# Patient Record
Sex: Female | Born: 1935 | Race: Black or African American | Hispanic: No | Smoking: Former smoker
Health system: Southern US, Community
[De-identification: ages and names within clinical notes are randomized; demographics above are authoritative.]

## PROBLEM LIST (undated history)

## (undated) DIAGNOSIS — F419 Anxiety disorder, unspecified: Secondary | ICD-10-CM

## (undated) DIAGNOSIS — F0390 Unspecified dementia without behavioral disturbance: Secondary | ICD-10-CM

## (undated) DIAGNOSIS — E538 Deficiency of other specified B group vitamins: Secondary | ICD-10-CM

## (undated) DIAGNOSIS — F319 Bipolar disorder, unspecified: Secondary | ICD-10-CM

## (undated) DIAGNOSIS — I4510 Unspecified right bundle-branch block: Secondary | ICD-10-CM

## (undated) DIAGNOSIS — I10 Essential (primary) hypertension: Secondary | ICD-10-CM

## (undated) HISTORY — DX: Unspecified right bundle-branch block: I45.10

## (undated) HISTORY — DX: Bipolar disorder, unspecified: F31.9

---

## 2004-03-15 ENCOUNTER — Other Ambulatory Visit: Payer: Self-pay

## 2004-04-16 ENCOUNTER — Ambulatory Visit: Payer: Self-pay | Admitting: Internal Medicine

## 2004-06-28 ENCOUNTER — Ambulatory Visit: Payer: Self-pay | Admitting: Internal Medicine

## 2004-11-29 ENCOUNTER — Ambulatory Visit: Payer: Self-pay | Admitting: Pain Medicine

## 2005-05-16 ENCOUNTER — Ambulatory Visit: Payer: Self-pay | Admitting: Internal Medicine

## 2005-09-10 ENCOUNTER — Other Ambulatory Visit: Payer: Self-pay

## 2005-09-11 ENCOUNTER — Other Ambulatory Visit: Payer: Self-pay

## 2005-09-11 ENCOUNTER — Observation Stay: Payer: Self-pay | Admitting: Internal Medicine

## 2005-11-15 ENCOUNTER — Ambulatory Visit: Payer: Self-pay | Admitting: Internal Medicine

## 2005-12-20 ENCOUNTER — Emergency Department: Payer: Self-pay | Admitting: Emergency Medicine

## 2006-02-17 ENCOUNTER — Ambulatory Visit: Payer: Self-pay | Admitting: Internal Medicine

## 2006-05-27 ENCOUNTER — Ambulatory Visit: Payer: Self-pay | Admitting: Ophthalmology

## 2006-06-20 ENCOUNTER — Ambulatory Visit: Payer: Self-pay | Admitting: Internal Medicine

## 2006-12-12 ENCOUNTER — Other Ambulatory Visit: Payer: Self-pay

## 2006-12-12 ENCOUNTER — Ambulatory Visit: Payer: Self-pay | Admitting: Ophthalmology

## 2006-12-17 ENCOUNTER — Ambulatory Visit: Payer: Self-pay | Admitting: Ophthalmology

## 2007-01-08 ENCOUNTER — Ambulatory Visit: Payer: Self-pay | Admitting: Ophthalmology

## 2007-05-19 ENCOUNTER — Ambulatory Visit: Payer: Self-pay | Admitting: Neurology

## 2007-06-01 ENCOUNTER — Other Ambulatory Visit: Payer: Self-pay

## 2007-06-01 ENCOUNTER — Emergency Department: Payer: Self-pay | Admitting: Emergency Medicine

## 2007-07-21 ENCOUNTER — Ambulatory Visit: Payer: Self-pay | Admitting: Internal Medicine

## 2007-09-07 ENCOUNTER — Ambulatory Visit: Payer: Self-pay | Admitting: Gastroenterology

## 2008-03-20 ENCOUNTER — Other Ambulatory Visit: Payer: Self-pay

## 2008-03-20 ENCOUNTER — Inpatient Hospital Stay: Payer: Self-pay | Admitting: Internal Medicine

## 2008-03-21 ENCOUNTER — Other Ambulatory Visit: Payer: Self-pay

## 2008-09-07 ENCOUNTER — Ambulatory Visit: Payer: Self-pay | Admitting: Internal Medicine

## 2008-09-19 ENCOUNTER — Ambulatory Visit: Payer: Self-pay | Admitting: Neurology

## 2008-11-11 ENCOUNTER — Emergency Department: Payer: Self-pay | Admitting: Internal Medicine

## 2009-03-03 ENCOUNTER — Emergency Department: Payer: Self-pay | Admitting: Emergency Medicine

## 2009-07-08 ENCOUNTER — Inpatient Hospital Stay: Payer: Self-pay | Admitting: Internal Medicine

## 2009-09-13 ENCOUNTER — Ambulatory Visit: Payer: Self-pay | Admitting: Internal Medicine

## 2010-03-01 ENCOUNTER — Ambulatory Visit: Payer: Self-pay | Admitting: Internal Medicine

## 2010-07-18 ENCOUNTER — Ambulatory Visit: Payer: Self-pay | Admitting: Pain Medicine

## 2010-07-25 ENCOUNTER — Ambulatory Visit: Payer: Self-pay | Admitting: Pain Medicine

## 2010-07-31 ENCOUNTER — Ambulatory Visit: Payer: Self-pay | Admitting: Pain Medicine

## 2010-09-19 ENCOUNTER — Ambulatory Visit: Payer: Self-pay | Admitting: Pain Medicine

## 2010-09-25 ENCOUNTER — Ambulatory Visit: Payer: Self-pay | Admitting: Internal Medicine

## 2010-09-27 ENCOUNTER — Ambulatory Visit: Payer: Self-pay | Admitting: Pain Medicine

## 2011-01-01 ENCOUNTER — Ambulatory Visit: Payer: Self-pay | Admitting: Pain Medicine

## 2011-01-08 ENCOUNTER — Ambulatory Visit: Payer: Self-pay | Admitting: Pain Medicine

## 2011-03-07 ENCOUNTER — Ambulatory Visit: Payer: Self-pay | Admitting: Pain Medicine

## 2011-03-25 ENCOUNTER — Ambulatory Visit: Payer: Self-pay | Admitting: Pain Medicine

## 2011-06-13 ENCOUNTER — Ambulatory Visit: Payer: Self-pay | Admitting: Pain Medicine

## 2011-07-03 ENCOUNTER — Ambulatory Visit: Payer: Self-pay | Admitting: Pain Medicine

## 2011-07-11 ENCOUNTER — Ambulatory Visit: Payer: Self-pay | Admitting: Pain Medicine

## 2011-08-01 ENCOUNTER — Ambulatory Visit: Payer: Self-pay | Admitting: Pain Medicine

## 2011-08-21 ENCOUNTER — Ambulatory Visit: Payer: Self-pay | Admitting: Pain Medicine

## 2011-11-08 ENCOUNTER — Ambulatory Visit: Payer: Self-pay | Admitting: Internal Medicine

## 2012-09-26 ENCOUNTER — Emergency Department: Payer: Self-pay | Admitting: Emergency Medicine

## 2012-09-26 LAB — SEDIMENTATION RATE: Erythrocyte Sed Rate: 36 mm/hr — ABNORMAL HIGH (ref 0–30)

## 2012-12-23 ENCOUNTER — Ambulatory Visit: Payer: Self-pay | Admitting: Internal Medicine

## 2014-07-11 DIAGNOSIS — F419 Anxiety disorder, unspecified: Secondary | ICD-10-CM | POA: Diagnosis not present

## 2014-07-11 DIAGNOSIS — N183 Chronic kidney disease, stage 3 (moderate): Secondary | ICD-10-CM | POA: Diagnosis not present

## 2014-07-11 DIAGNOSIS — W19XXXA Unspecified fall, initial encounter: Secondary | ICD-10-CM | POA: Diagnosis not present

## 2014-07-11 DIAGNOSIS — I10 Essential (primary) hypertension: Secondary | ICD-10-CM | POA: Diagnosis not present

## 2014-10-11 DIAGNOSIS — E538 Deficiency of other specified B group vitamins: Secondary | ICD-10-CM | POA: Insufficient documentation

## 2014-10-11 DIAGNOSIS — I10 Essential (primary) hypertension: Secondary | ICD-10-CM | POA: Diagnosis not present

## 2014-10-11 DIAGNOSIS — N183 Chronic kidney disease, stage 3 (moderate): Secondary | ICD-10-CM | POA: Diagnosis not present

## 2014-10-11 DIAGNOSIS — E782 Mixed hyperlipidemia: Secondary | ICD-10-CM | POA: Diagnosis not present

## 2014-10-11 DIAGNOSIS — R413 Other amnesia: Secondary | ICD-10-CM | POA: Diagnosis not present

## 2015-09-05 DIAGNOSIS — I10 Essential (primary) hypertension: Secondary | ICD-10-CM | POA: Diagnosis not present

## 2015-09-05 DIAGNOSIS — E538 Deficiency of other specified B group vitamins: Secondary | ICD-10-CM | POA: Diagnosis not present

## 2015-09-05 DIAGNOSIS — R5383 Other fatigue: Secondary | ICD-10-CM | POA: Diagnosis not present

## 2015-09-05 DIAGNOSIS — R5381 Other malaise: Secondary | ICD-10-CM | POA: Diagnosis not present

## 2015-09-05 DIAGNOSIS — R413 Other amnesia: Secondary | ICD-10-CM | POA: Diagnosis not present

## 2016-01-05 DIAGNOSIS — R413 Other amnesia: Secondary | ICD-10-CM | POA: Diagnosis not present

## 2016-01-05 DIAGNOSIS — I10 Essential (primary) hypertension: Secondary | ICD-10-CM | POA: Diagnosis not present

## 2016-01-05 DIAGNOSIS — Z0000 Encounter for general adult medical examination without abnormal findings: Secondary | ICD-10-CM | POA: Diagnosis not present

## 2016-01-05 DIAGNOSIS — F419 Anxiety disorder, unspecified: Secondary | ICD-10-CM | POA: Diagnosis not present

## 2016-01-05 DIAGNOSIS — E782 Mixed hyperlipidemia: Secondary | ICD-10-CM | POA: Diagnosis not present

## 2016-01-05 DIAGNOSIS — N183 Chronic kidney disease, stage 3 (moderate): Secondary | ICD-10-CM | POA: Diagnosis not present

## 2016-01-05 DIAGNOSIS — Z8669 Personal history of other diseases of the nervous system and sense organs: Secondary | ICD-10-CM | POA: Diagnosis not present

## 2016-02-06 DIAGNOSIS — R451 Restlessness and agitation: Secondary | ICD-10-CM | POA: Diagnosis not present

## 2016-02-16 DIAGNOSIS — F0281 Dementia in other diseases classified elsewhere with behavioral disturbance: Secondary | ICD-10-CM | POA: Diagnosis not present

## 2016-02-16 DIAGNOSIS — F329 Major depressive disorder, single episode, unspecified: Secondary | ICD-10-CM | POA: Diagnosis not present

## 2016-02-16 DIAGNOSIS — Z9181 History of falling: Secondary | ICD-10-CM | POA: Diagnosis not present

## 2016-02-16 DIAGNOSIS — F419 Anxiety disorder, unspecified: Secondary | ICD-10-CM | POA: Diagnosis not present

## 2016-02-16 DIAGNOSIS — I129 Hypertensive chronic kidney disease with stage 1 through stage 4 chronic kidney disease, or unspecified chronic kidney disease: Secondary | ICD-10-CM | POA: Diagnosis not present

## 2016-02-16 DIAGNOSIS — D519 Vitamin B12 deficiency anemia, unspecified: Secondary | ICD-10-CM | POA: Diagnosis not present

## 2016-02-16 DIAGNOSIS — G309 Alzheimer's disease, unspecified: Secondary | ICD-10-CM | POA: Diagnosis not present

## 2016-02-16 DIAGNOSIS — N183 Chronic kidney disease, stage 3 (moderate): Secondary | ICD-10-CM | POA: Diagnosis not present

## 2016-02-20 DIAGNOSIS — F0281 Dementia in other diseases classified elsewhere with behavioral disturbance: Secondary | ICD-10-CM | POA: Diagnosis not present

## 2016-02-20 DIAGNOSIS — F329 Major depressive disorder, single episode, unspecified: Secondary | ICD-10-CM | POA: Diagnosis not present

## 2016-02-20 DIAGNOSIS — Z9181 History of falling: Secondary | ICD-10-CM | POA: Diagnosis not present

## 2016-02-20 DIAGNOSIS — F419 Anxiety disorder, unspecified: Secondary | ICD-10-CM | POA: Diagnosis not present

## 2016-02-20 DIAGNOSIS — G309 Alzheimer's disease, unspecified: Secondary | ICD-10-CM | POA: Diagnosis not present

## 2016-02-20 DIAGNOSIS — D519 Vitamin B12 deficiency anemia, unspecified: Secondary | ICD-10-CM | POA: Diagnosis not present

## 2016-02-20 DIAGNOSIS — I129 Hypertensive chronic kidney disease with stage 1 through stage 4 chronic kidney disease, or unspecified chronic kidney disease: Secondary | ICD-10-CM | POA: Diagnosis not present

## 2016-02-20 DIAGNOSIS — N183 Chronic kidney disease, stage 3 (moderate): Secondary | ICD-10-CM | POA: Diagnosis not present

## 2016-02-22 DIAGNOSIS — F0281 Dementia in other diseases classified elsewhere with behavioral disturbance: Secondary | ICD-10-CM | POA: Diagnosis not present

## 2016-02-22 DIAGNOSIS — F419 Anxiety disorder, unspecified: Secondary | ICD-10-CM | POA: Diagnosis not present

## 2016-02-22 DIAGNOSIS — Z9181 History of falling: Secondary | ICD-10-CM | POA: Diagnosis not present

## 2016-02-22 DIAGNOSIS — F329 Major depressive disorder, single episode, unspecified: Secondary | ICD-10-CM | POA: Diagnosis not present

## 2016-02-22 DIAGNOSIS — D519 Vitamin B12 deficiency anemia, unspecified: Secondary | ICD-10-CM | POA: Diagnosis not present

## 2016-02-22 DIAGNOSIS — I129 Hypertensive chronic kidney disease with stage 1 through stage 4 chronic kidney disease, or unspecified chronic kidney disease: Secondary | ICD-10-CM | POA: Diagnosis not present

## 2016-02-22 DIAGNOSIS — G309 Alzheimer's disease, unspecified: Secondary | ICD-10-CM | POA: Diagnosis not present

## 2016-02-22 DIAGNOSIS — N183 Chronic kidney disease, stage 3 (moderate): Secondary | ICD-10-CM | POA: Diagnosis not present

## 2016-02-23 DIAGNOSIS — F329 Major depressive disorder, single episode, unspecified: Secondary | ICD-10-CM | POA: Diagnosis not present

## 2016-02-23 DIAGNOSIS — N183 Chronic kidney disease, stage 3 (moderate): Secondary | ICD-10-CM | POA: Diagnosis not present

## 2016-02-23 DIAGNOSIS — I129 Hypertensive chronic kidney disease with stage 1 through stage 4 chronic kidney disease, or unspecified chronic kidney disease: Secondary | ICD-10-CM | POA: Diagnosis not present

## 2016-02-23 DIAGNOSIS — G309 Alzheimer's disease, unspecified: Secondary | ICD-10-CM | POA: Diagnosis not present

## 2016-02-23 DIAGNOSIS — F419 Anxiety disorder, unspecified: Secondary | ICD-10-CM | POA: Diagnosis not present

## 2016-02-23 DIAGNOSIS — D519 Vitamin B12 deficiency anemia, unspecified: Secondary | ICD-10-CM | POA: Diagnosis not present

## 2016-02-23 DIAGNOSIS — F0281 Dementia in other diseases classified elsewhere with behavioral disturbance: Secondary | ICD-10-CM | POA: Diagnosis not present

## 2016-02-29 DIAGNOSIS — N183 Chronic kidney disease, stage 3 (moderate): Secondary | ICD-10-CM | POA: Diagnosis not present

## 2016-02-29 DIAGNOSIS — F419 Anxiety disorder, unspecified: Secondary | ICD-10-CM | POA: Diagnosis not present

## 2016-02-29 DIAGNOSIS — Z9181 History of falling: Secondary | ICD-10-CM | POA: Diagnosis not present

## 2016-02-29 DIAGNOSIS — I129 Hypertensive chronic kidney disease with stage 1 through stage 4 chronic kidney disease, or unspecified chronic kidney disease: Secondary | ICD-10-CM | POA: Diagnosis not present

## 2016-02-29 DIAGNOSIS — F329 Major depressive disorder, single episode, unspecified: Secondary | ICD-10-CM | POA: Diagnosis not present

## 2016-02-29 DIAGNOSIS — G309 Alzheimer's disease, unspecified: Secondary | ICD-10-CM | POA: Diagnosis not present

## 2016-02-29 DIAGNOSIS — F0281 Dementia in other diseases classified elsewhere with behavioral disturbance: Secondary | ICD-10-CM | POA: Diagnosis not present

## 2016-02-29 DIAGNOSIS — D519 Vitamin B12 deficiency anemia, unspecified: Secondary | ICD-10-CM | POA: Diagnosis not present

## 2016-03-05 DIAGNOSIS — N183 Chronic kidney disease, stage 3 (moderate): Secondary | ICD-10-CM | POA: Diagnosis not present

## 2016-03-05 DIAGNOSIS — F0281 Dementia in other diseases classified elsewhere with behavioral disturbance: Secondary | ICD-10-CM | POA: Diagnosis not present

## 2016-03-05 DIAGNOSIS — D519 Vitamin B12 deficiency anemia, unspecified: Secondary | ICD-10-CM | POA: Diagnosis not present

## 2016-03-05 DIAGNOSIS — Z9181 History of falling: Secondary | ICD-10-CM | POA: Diagnosis not present

## 2016-03-05 DIAGNOSIS — G309 Alzheimer's disease, unspecified: Secondary | ICD-10-CM | POA: Diagnosis not present

## 2016-03-05 DIAGNOSIS — F329 Major depressive disorder, single episode, unspecified: Secondary | ICD-10-CM | POA: Diagnosis not present

## 2016-03-05 DIAGNOSIS — I129 Hypertensive chronic kidney disease with stage 1 through stage 4 chronic kidney disease, or unspecified chronic kidney disease: Secondary | ICD-10-CM | POA: Diagnosis not present

## 2016-03-05 DIAGNOSIS — F419 Anxiety disorder, unspecified: Secondary | ICD-10-CM | POA: Diagnosis not present

## 2016-03-11 DIAGNOSIS — N183 Chronic kidney disease, stage 3 (moderate): Secondary | ICD-10-CM | POA: Diagnosis not present

## 2016-03-11 DIAGNOSIS — F419 Anxiety disorder, unspecified: Secondary | ICD-10-CM | POA: Diagnosis not present

## 2016-03-11 DIAGNOSIS — Z9181 History of falling: Secondary | ICD-10-CM | POA: Diagnosis not present

## 2016-03-11 DIAGNOSIS — I129 Hypertensive chronic kidney disease with stage 1 through stage 4 chronic kidney disease, or unspecified chronic kidney disease: Secondary | ICD-10-CM | POA: Diagnosis not present

## 2016-03-11 DIAGNOSIS — F0281 Dementia in other diseases classified elsewhere with behavioral disturbance: Secondary | ICD-10-CM | POA: Diagnosis not present

## 2016-03-11 DIAGNOSIS — D519 Vitamin B12 deficiency anemia, unspecified: Secondary | ICD-10-CM | POA: Diagnosis not present

## 2016-03-11 DIAGNOSIS — G309 Alzheimer's disease, unspecified: Secondary | ICD-10-CM | POA: Diagnosis not present

## 2016-03-11 DIAGNOSIS — F329 Major depressive disorder, single episode, unspecified: Secondary | ICD-10-CM | POA: Diagnosis not present

## 2016-03-13 DIAGNOSIS — F329 Major depressive disorder, single episode, unspecified: Secondary | ICD-10-CM | POA: Diagnosis not present

## 2016-03-13 DIAGNOSIS — D519 Vitamin B12 deficiency anemia, unspecified: Secondary | ICD-10-CM | POA: Diagnosis not present

## 2016-03-13 DIAGNOSIS — G309 Alzheimer's disease, unspecified: Secondary | ICD-10-CM | POA: Diagnosis not present

## 2016-03-13 DIAGNOSIS — F0281 Dementia in other diseases classified elsewhere with behavioral disturbance: Secondary | ICD-10-CM | POA: Diagnosis not present

## 2016-03-13 DIAGNOSIS — F419 Anxiety disorder, unspecified: Secondary | ICD-10-CM | POA: Diagnosis not present

## 2016-03-13 DIAGNOSIS — Z9181 History of falling: Secondary | ICD-10-CM | POA: Diagnosis not present

## 2016-03-13 DIAGNOSIS — I129 Hypertensive chronic kidney disease with stage 1 through stage 4 chronic kidney disease, or unspecified chronic kidney disease: Secondary | ICD-10-CM | POA: Diagnosis not present

## 2016-03-13 DIAGNOSIS — N183 Chronic kidney disease, stage 3 (moderate): Secondary | ICD-10-CM | POA: Diagnosis not present

## 2016-03-15 DIAGNOSIS — F329 Major depressive disorder, single episode, unspecified: Secondary | ICD-10-CM | POA: Diagnosis not present

## 2016-03-15 DIAGNOSIS — F0281 Dementia in other diseases classified elsewhere with behavioral disturbance: Secondary | ICD-10-CM | POA: Diagnosis not present

## 2016-03-15 DIAGNOSIS — Z9181 History of falling: Secondary | ICD-10-CM | POA: Diagnosis not present

## 2016-03-15 DIAGNOSIS — I129 Hypertensive chronic kidney disease with stage 1 through stage 4 chronic kidney disease, or unspecified chronic kidney disease: Secondary | ICD-10-CM | POA: Diagnosis not present

## 2016-03-15 DIAGNOSIS — G309 Alzheimer's disease, unspecified: Secondary | ICD-10-CM | POA: Diagnosis not present

## 2016-03-15 DIAGNOSIS — F419 Anxiety disorder, unspecified: Secondary | ICD-10-CM | POA: Diagnosis not present

## 2016-03-15 DIAGNOSIS — D519 Vitamin B12 deficiency anemia, unspecified: Secondary | ICD-10-CM | POA: Diagnosis not present

## 2016-03-15 DIAGNOSIS — N183 Chronic kidney disease, stage 3 (moderate): Secondary | ICD-10-CM | POA: Diagnosis not present

## 2016-03-18 DIAGNOSIS — G309 Alzheimer's disease, unspecified: Secondary | ICD-10-CM | POA: Diagnosis not present

## 2016-03-18 DIAGNOSIS — F0281 Dementia in other diseases classified elsewhere with behavioral disturbance: Secondary | ICD-10-CM | POA: Diagnosis not present

## 2016-03-18 DIAGNOSIS — F419 Anxiety disorder, unspecified: Secondary | ICD-10-CM | POA: Diagnosis not present

## 2016-03-18 DIAGNOSIS — D519 Vitamin B12 deficiency anemia, unspecified: Secondary | ICD-10-CM | POA: Diagnosis not present

## 2016-03-18 DIAGNOSIS — N183 Chronic kidney disease, stage 3 (moderate): Secondary | ICD-10-CM | POA: Diagnosis not present

## 2016-03-18 DIAGNOSIS — F329 Major depressive disorder, single episode, unspecified: Secondary | ICD-10-CM | POA: Diagnosis not present

## 2016-03-18 DIAGNOSIS — I129 Hypertensive chronic kidney disease with stage 1 through stage 4 chronic kidney disease, or unspecified chronic kidney disease: Secondary | ICD-10-CM | POA: Diagnosis not present

## 2016-03-18 DIAGNOSIS — Z9181 History of falling: Secondary | ICD-10-CM | POA: Diagnosis not present

## 2016-03-21 DIAGNOSIS — E782 Mixed hyperlipidemia: Secondary | ICD-10-CM | POA: Diagnosis not present

## 2016-03-21 DIAGNOSIS — F419 Anxiety disorder, unspecified: Secondary | ICD-10-CM | POA: Diagnosis not present

## 2016-03-21 DIAGNOSIS — I10 Essential (primary) hypertension: Secondary | ICD-10-CM | POA: Diagnosis not present

## 2016-03-21 DIAGNOSIS — I129 Hypertensive chronic kidney disease with stage 1 through stage 4 chronic kidney disease, or unspecified chronic kidney disease: Secondary | ICD-10-CM | POA: Diagnosis not present

## 2016-03-21 DIAGNOSIS — R413 Other amnesia: Secondary | ICD-10-CM | POA: Diagnosis not present

## 2016-03-21 DIAGNOSIS — Z9181 History of falling: Secondary | ICD-10-CM | POA: Diagnosis not present

## 2016-03-21 DIAGNOSIS — D508 Other iron deficiency anemias: Secondary | ICD-10-CM | POA: Diagnosis not present

## 2016-03-21 DIAGNOSIS — F329 Major depressive disorder, single episode, unspecified: Secondary | ICD-10-CM | POA: Diagnosis not present

## 2016-03-21 DIAGNOSIS — E538 Deficiency of other specified B group vitamins: Secondary | ICD-10-CM | POA: Diagnosis not present

## 2016-03-21 DIAGNOSIS — M542 Cervicalgia: Secondary | ICD-10-CM | POA: Diagnosis not present

## 2016-03-21 DIAGNOSIS — F0281 Dementia in other diseases classified elsewhere with behavioral disturbance: Secondary | ICD-10-CM | POA: Diagnosis not present

## 2016-03-21 DIAGNOSIS — D519 Vitamin B12 deficiency anemia, unspecified: Secondary | ICD-10-CM | POA: Diagnosis not present

## 2016-03-21 DIAGNOSIS — Z8669 Personal history of other diseases of the nervous system and sense organs: Secondary | ICD-10-CM | POA: Diagnosis not present

## 2016-03-21 DIAGNOSIS — N183 Chronic kidney disease, stage 3 (moderate): Secondary | ICD-10-CM | POA: Diagnosis not present

## 2016-03-21 DIAGNOSIS — G309 Alzheimer's disease, unspecified: Secondary | ICD-10-CM | POA: Diagnosis not present

## 2016-03-22 DIAGNOSIS — M47812 Spondylosis without myelopathy or radiculopathy, cervical region: Secondary | ICD-10-CM | POA: Diagnosis not present

## 2016-03-22 DIAGNOSIS — M542 Cervicalgia: Secondary | ICD-10-CM | POA: Diagnosis not present

## 2016-03-25 DIAGNOSIS — Z9181 History of falling: Secondary | ICD-10-CM | POA: Diagnosis not present

## 2016-03-25 DIAGNOSIS — G309 Alzheimer's disease, unspecified: Secondary | ICD-10-CM | POA: Diagnosis not present

## 2016-03-25 DIAGNOSIS — D519 Vitamin B12 deficiency anemia, unspecified: Secondary | ICD-10-CM | POA: Diagnosis not present

## 2016-03-25 DIAGNOSIS — I129 Hypertensive chronic kidney disease with stage 1 through stage 4 chronic kidney disease, or unspecified chronic kidney disease: Secondary | ICD-10-CM | POA: Diagnosis not present

## 2016-03-25 DIAGNOSIS — F419 Anxiety disorder, unspecified: Secondary | ICD-10-CM | POA: Diagnosis not present

## 2016-03-25 DIAGNOSIS — F329 Major depressive disorder, single episode, unspecified: Secondary | ICD-10-CM | POA: Diagnosis not present

## 2016-03-25 DIAGNOSIS — F0281 Dementia in other diseases classified elsewhere with behavioral disturbance: Secondary | ICD-10-CM | POA: Diagnosis not present

## 2016-03-25 DIAGNOSIS — N183 Chronic kidney disease, stage 3 (moderate): Secondary | ICD-10-CM | POA: Diagnosis not present

## 2016-03-27 DIAGNOSIS — N183 Chronic kidney disease, stage 3 (moderate): Secondary | ICD-10-CM | POA: Diagnosis not present

## 2016-03-27 DIAGNOSIS — I129 Hypertensive chronic kidney disease with stage 1 through stage 4 chronic kidney disease, or unspecified chronic kidney disease: Secondary | ICD-10-CM | POA: Diagnosis not present

## 2016-03-27 DIAGNOSIS — D519 Vitamin B12 deficiency anemia, unspecified: Secondary | ICD-10-CM | POA: Diagnosis not present

## 2016-03-27 DIAGNOSIS — F0281 Dementia in other diseases classified elsewhere with behavioral disturbance: Secondary | ICD-10-CM | POA: Diagnosis not present

## 2016-03-27 DIAGNOSIS — F419 Anxiety disorder, unspecified: Secondary | ICD-10-CM | POA: Diagnosis not present

## 2016-03-27 DIAGNOSIS — G309 Alzheimer's disease, unspecified: Secondary | ICD-10-CM | POA: Diagnosis not present

## 2016-03-27 DIAGNOSIS — Z9181 History of falling: Secondary | ICD-10-CM | POA: Diagnosis not present

## 2016-03-27 DIAGNOSIS — F329 Major depressive disorder, single episode, unspecified: Secondary | ICD-10-CM | POA: Diagnosis not present

## 2016-03-28 DIAGNOSIS — N183 Chronic kidney disease, stage 3 (moderate): Secondary | ICD-10-CM | POA: Diagnosis not present

## 2016-03-28 DIAGNOSIS — F419 Anxiety disorder, unspecified: Secondary | ICD-10-CM | POA: Diagnosis not present

## 2016-03-28 DIAGNOSIS — D519 Vitamin B12 deficiency anemia, unspecified: Secondary | ICD-10-CM | POA: Diagnosis not present

## 2016-03-28 DIAGNOSIS — F0281 Dementia in other diseases classified elsewhere with behavioral disturbance: Secondary | ICD-10-CM | POA: Diagnosis not present

## 2016-03-28 DIAGNOSIS — I129 Hypertensive chronic kidney disease with stage 1 through stage 4 chronic kidney disease, or unspecified chronic kidney disease: Secondary | ICD-10-CM | POA: Diagnosis not present

## 2016-03-28 DIAGNOSIS — F329 Major depressive disorder, single episode, unspecified: Secondary | ICD-10-CM | POA: Diagnosis not present

## 2016-03-28 DIAGNOSIS — Z9181 History of falling: Secondary | ICD-10-CM | POA: Diagnosis not present

## 2016-03-28 DIAGNOSIS — G309 Alzheimer's disease, unspecified: Secondary | ICD-10-CM | POA: Diagnosis not present

## 2016-04-01 DIAGNOSIS — G309 Alzheimer's disease, unspecified: Secondary | ICD-10-CM | POA: Diagnosis not present

## 2016-04-01 DIAGNOSIS — R2689 Other abnormalities of gait and mobility: Secondary | ICD-10-CM | POA: Diagnosis not present

## 2016-04-01 DIAGNOSIS — N183 Chronic kidney disease, stage 3 (moderate): Secondary | ICD-10-CM | POA: Diagnosis not present

## 2016-04-01 DIAGNOSIS — F329 Major depressive disorder, single episode, unspecified: Secondary | ICD-10-CM | POA: Diagnosis not present

## 2016-04-01 DIAGNOSIS — F419 Anxiety disorder, unspecified: Secondary | ICD-10-CM | POA: Diagnosis not present

## 2016-04-01 DIAGNOSIS — D519 Vitamin B12 deficiency anemia, unspecified: Secondary | ICD-10-CM | POA: Diagnosis not present

## 2016-04-01 DIAGNOSIS — I129 Hypertensive chronic kidney disease with stage 1 through stage 4 chronic kidney disease, or unspecified chronic kidney disease: Secondary | ICD-10-CM | POA: Diagnosis not present

## 2016-04-01 DIAGNOSIS — Z9181 History of falling: Secondary | ICD-10-CM | POA: Diagnosis not present

## 2016-04-01 DIAGNOSIS — F0281 Dementia in other diseases classified elsewhere with behavioral disturbance: Secondary | ICD-10-CM | POA: Diagnosis not present

## 2016-04-02 DIAGNOSIS — D519 Vitamin B12 deficiency anemia, unspecified: Secondary | ICD-10-CM | POA: Diagnosis not present

## 2016-04-02 DIAGNOSIS — N183 Chronic kidney disease, stage 3 (moderate): Secondary | ICD-10-CM | POA: Diagnosis not present

## 2016-04-02 DIAGNOSIS — F0281 Dementia in other diseases classified elsewhere with behavioral disturbance: Secondary | ICD-10-CM | POA: Diagnosis not present

## 2016-04-02 DIAGNOSIS — G309 Alzheimer's disease, unspecified: Secondary | ICD-10-CM | POA: Diagnosis not present

## 2016-04-02 DIAGNOSIS — I129 Hypertensive chronic kidney disease with stage 1 through stage 4 chronic kidney disease, or unspecified chronic kidney disease: Secondary | ICD-10-CM | POA: Diagnosis not present

## 2016-04-02 DIAGNOSIS — F419 Anxiety disorder, unspecified: Secondary | ICD-10-CM | POA: Diagnosis not present

## 2016-04-02 DIAGNOSIS — Z9181 History of falling: Secondary | ICD-10-CM | POA: Diagnosis not present

## 2016-04-02 DIAGNOSIS — R2689 Other abnormalities of gait and mobility: Secondary | ICD-10-CM | POA: Diagnosis not present

## 2016-04-02 DIAGNOSIS — F329 Major depressive disorder, single episode, unspecified: Secondary | ICD-10-CM | POA: Diagnosis not present

## 2016-04-08 DIAGNOSIS — R2689 Other abnormalities of gait and mobility: Secondary | ICD-10-CM | POA: Diagnosis not present

## 2016-04-08 DIAGNOSIS — F329 Major depressive disorder, single episode, unspecified: Secondary | ICD-10-CM | POA: Diagnosis not present

## 2016-04-08 DIAGNOSIS — I129 Hypertensive chronic kidney disease with stage 1 through stage 4 chronic kidney disease, or unspecified chronic kidney disease: Secondary | ICD-10-CM | POA: Diagnosis not present

## 2016-04-08 DIAGNOSIS — G309 Alzheimer's disease, unspecified: Secondary | ICD-10-CM | POA: Diagnosis not present

## 2016-04-08 DIAGNOSIS — F0281 Dementia in other diseases classified elsewhere with behavioral disturbance: Secondary | ICD-10-CM | POA: Diagnosis not present

## 2016-04-08 DIAGNOSIS — Z9181 History of falling: Secondary | ICD-10-CM | POA: Diagnosis not present

## 2016-04-08 DIAGNOSIS — N183 Chronic kidney disease, stage 3 (moderate): Secondary | ICD-10-CM | POA: Diagnosis not present

## 2016-04-08 DIAGNOSIS — F419 Anxiety disorder, unspecified: Secondary | ICD-10-CM | POA: Diagnosis not present

## 2016-04-08 DIAGNOSIS — D519 Vitamin B12 deficiency anemia, unspecified: Secondary | ICD-10-CM | POA: Diagnosis not present

## 2016-04-11 DIAGNOSIS — I129 Hypertensive chronic kidney disease with stage 1 through stage 4 chronic kidney disease, or unspecified chronic kidney disease: Secondary | ICD-10-CM | POA: Diagnosis not present

## 2016-04-11 DIAGNOSIS — F419 Anxiety disorder, unspecified: Secondary | ICD-10-CM | POA: Diagnosis not present

## 2016-04-11 DIAGNOSIS — G309 Alzheimer's disease, unspecified: Secondary | ICD-10-CM | POA: Diagnosis not present

## 2016-04-11 DIAGNOSIS — F329 Major depressive disorder, single episode, unspecified: Secondary | ICD-10-CM | POA: Diagnosis not present

## 2016-04-11 DIAGNOSIS — R2689 Other abnormalities of gait and mobility: Secondary | ICD-10-CM | POA: Diagnosis not present

## 2016-04-11 DIAGNOSIS — D519 Vitamin B12 deficiency anemia, unspecified: Secondary | ICD-10-CM | POA: Diagnosis not present

## 2016-04-11 DIAGNOSIS — N183 Chronic kidney disease, stage 3 (moderate): Secondary | ICD-10-CM | POA: Diagnosis not present

## 2016-04-11 DIAGNOSIS — Z9181 History of falling: Secondary | ICD-10-CM | POA: Diagnosis not present

## 2016-04-11 DIAGNOSIS — F0281 Dementia in other diseases classified elsewhere with behavioral disturbance: Secondary | ICD-10-CM | POA: Diagnosis not present

## 2016-04-16 DIAGNOSIS — D519 Vitamin B12 deficiency anemia, unspecified: Secondary | ICD-10-CM | POA: Diagnosis not present

## 2016-04-16 DIAGNOSIS — R2689 Other abnormalities of gait and mobility: Secondary | ICD-10-CM | POA: Diagnosis not present

## 2016-04-16 DIAGNOSIS — F419 Anxiety disorder, unspecified: Secondary | ICD-10-CM | POA: Diagnosis not present

## 2016-04-16 DIAGNOSIS — Z9181 History of falling: Secondary | ICD-10-CM | POA: Diagnosis not present

## 2016-04-16 DIAGNOSIS — I129 Hypertensive chronic kidney disease with stage 1 through stage 4 chronic kidney disease, or unspecified chronic kidney disease: Secondary | ICD-10-CM | POA: Diagnosis not present

## 2016-04-16 DIAGNOSIS — N183 Chronic kidney disease, stage 3 (moderate): Secondary | ICD-10-CM | POA: Diagnosis not present

## 2016-04-16 DIAGNOSIS — F0281 Dementia in other diseases classified elsewhere with behavioral disturbance: Secondary | ICD-10-CM | POA: Diagnosis not present

## 2016-04-16 DIAGNOSIS — F329 Major depressive disorder, single episode, unspecified: Secondary | ICD-10-CM | POA: Diagnosis not present

## 2016-04-16 DIAGNOSIS — G309 Alzheimer's disease, unspecified: Secondary | ICD-10-CM | POA: Diagnosis not present

## 2016-04-19 DIAGNOSIS — R2689 Other abnormalities of gait and mobility: Secondary | ICD-10-CM | POA: Diagnosis not present

## 2016-04-19 DIAGNOSIS — F419 Anxiety disorder, unspecified: Secondary | ICD-10-CM | POA: Diagnosis not present

## 2016-04-19 DIAGNOSIS — Z9181 History of falling: Secondary | ICD-10-CM | POA: Diagnosis not present

## 2016-04-19 DIAGNOSIS — N183 Chronic kidney disease, stage 3 (moderate): Secondary | ICD-10-CM | POA: Diagnosis not present

## 2016-04-19 DIAGNOSIS — G309 Alzheimer's disease, unspecified: Secondary | ICD-10-CM | POA: Diagnosis not present

## 2016-04-19 DIAGNOSIS — F329 Major depressive disorder, single episode, unspecified: Secondary | ICD-10-CM | POA: Diagnosis not present

## 2016-04-19 DIAGNOSIS — D519 Vitamin B12 deficiency anemia, unspecified: Secondary | ICD-10-CM | POA: Diagnosis not present

## 2016-04-19 DIAGNOSIS — F0281 Dementia in other diseases classified elsewhere with behavioral disturbance: Secondary | ICD-10-CM | POA: Diagnosis not present

## 2016-04-19 DIAGNOSIS — I129 Hypertensive chronic kidney disease with stage 1 through stage 4 chronic kidney disease, or unspecified chronic kidney disease: Secondary | ICD-10-CM | POA: Diagnosis not present

## 2016-04-22 DIAGNOSIS — F329 Major depressive disorder, single episode, unspecified: Secondary | ICD-10-CM | POA: Diagnosis not present

## 2016-04-22 DIAGNOSIS — F0281 Dementia in other diseases classified elsewhere with behavioral disturbance: Secondary | ICD-10-CM | POA: Diagnosis not present

## 2016-04-22 DIAGNOSIS — D519 Vitamin B12 deficiency anemia, unspecified: Secondary | ICD-10-CM | POA: Diagnosis not present

## 2016-04-22 DIAGNOSIS — R2689 Other abnormalities of gait and mobility: Secondary | ICD-10-CM | POA: Diagnosis not present

## 2016-04-22 DIAGNOSIS — G309 Alzheimer's disease, unspecified: Secondary | ICD-10-CM | POA: Diagnosis not present

## 2016-04-22 DIAGNOSIS — F419 Anxiety disorder, unspecified: Secondary | ICD-10-CM | POA: Diagnosis not present

## 2016-04-22 DIAGNOSIS — N183 Chronic kidney disease, stage 3 (moderate): Secondary | ICD-10-CM | POA: Diagnosis not present

## 2016-04-22 DIAGNOSIS — Z9181 History of falling: Secondary | ICD-10-CM | POA: Diagnosis not present

## 2016-04-22 DIAGNOSIS — I129 Hypertensive chronic kidney disease with stage 1 through stage 4 chronic kidney disease, or unspecified chronic kidney disease: Secondary | ICD-10-CM | POA: Diagnosis not present

## 2016-04-24 DIAGNOSIS — G309 Alzheimer's disease, unspecified: Secondary | ICD-10-CM | POA: Diagnosis not present

## 2016-04-24 DIAGNOSIS — I129 Hypertensive chronic kidney disease with stage 1 through stage 4 chronic kidney disease, or unspecified chronic kidney disease: Secondary | ICD-10-CM | POA: Diagnosis not present

## 2016-04-24 DIAGNOSIS — R2689 Other abnormalities of gait and mobility: Secondary | ICD-10-CM | POA: Diagnosis not present

## 2016-04-24 DIAGNOSIS — F329 Major depressive disorder, single episode, unspecified: Secondary | ICD-10-CM | POA: Diagnosis not present

## 2016-04-24 DIAGNOSIS — D519 Vitamin B12 deficiency anemia, unspecified: Secondary | ICD-10-CM | POA: Diagnosis not present

## 2016-04-24 DIAGNOSIS — Z9181 History of falling: Secondary | ICD-10-CM | POA: Diagnosis not present

## 2016-04-24 DIAGNOSIS — N183 Chronic kidney disease, stage 3 (moderate): Secondary | ICD-10-CM | POA: Diagnosis not present

## 2016-04-24 DIAGNOSIS — F0281 Dementia in other diseases classified elsewhere with behavioral disturbance: Secondary | ICD-10-CM | POA: Diagnosis not present

## 2016-04-24 DIAGNOSIS — F419 Anxiety disorder, unspecified: Secondary | ICD-10-CM | POA: Diagnosis not present

## 2016-04-29 DIAGNOSIS — F419 Anxiety disorder, unspecified: Secondary | ICD-10-CM | POA: Diagnosis not present

## 2016-04-29 DIAGNOSIS — F0281 Dementia in other diseases classified elsewhere with behavioral disturbance: Secondary | ICD-10-CM | POA: Diagnosis not present

## 2016-04-29 DIAGNOSIS — F329 Major depressive disorder, single episode, unspecified: Secondary | ICD-10-CM | POA: Diagnosis not present

## 2016-04-29 DIAGNOSIS — D519 Vitamin B12 deficiency anemia, unspecified: Secondary | ICD-10-CM | POA: Diagnosis not present

## 2016-04-29 DIAGNOSIS — G309 Alzheimer's disease, unspecified: Secondary | ICD-10-CM | POA: Diagnosis not present

## 2016-04-29 DIAGNOSIS — R2689 Other abnormalities of gait and mobility: Secondary | ICD-10-CM | POA: Diagnosis not present

## 2016-04-29 DIAGNOSIS — Z9181 History of falling: Secondary | ICD-10-CM | POA: Diagnosis not present

## 2016-04-29 DIAGNOSIS — I129 Hypertensive chronic kidney disease with stage 1 through stage 4 chronic kidney disease, or unspecified chronic kidney disease: Secondary | ICD-10-CM | POA: Diagnosis not present

## 2016-04-29 DIAGNOSIS — N183 Chronic kidney disease, stage 3 (moderate): Secondary | ICD-10-CM | POA: Diagnosis not present

## 2016-04-30 DIAGNOSIS — I129 Hypertensive chronic kidney disease with stage 1 through stage 4 chronic kidney disease, or unspecified chronic kidney disease: Secondary | ICD-10-CM | POA: Diagnosis not present

## 2016-04-30 DIAGNOSIS — N183 Chronic kidney disease, stage 3 (moderate): Secondary | ICD-10-CM | POA: Diagnosis not present

## 2016-04-30 DIAGNOSIS — F0281 Dementia in other diseases classified elsewhere with behavioral disturbance: Secondary | ICD-10-CM | POA: Diagnosis not present

## 2016-04-30 DIAGNOSIS — R2689 Other abnormalities of gait and mobility: Secondary | ICD-10-CM | POA: Diagnosis not present

## 2016-04-30 DIAGNOSIS — Z9181 History of falling: Secondary | ICD-10-CM | POA: Diagnosis not present

## 2016-04-30 DIAGNOSIS — G309 Alzheimer's disease, unspecified: Secondary | ICD-10-CM | POA: Diagnosis not present

## 2016-04-30 DIAGNOSIS — F329 Major depressive disorder, single episode, unspecified: Secondary | ICD-10-CM | POA: Diagnosis not present

## 2016-04-30 DIAGNOSIS — D519 Vitamin B12 deficiency anemia, unspecified: Secondary | ICD-10-CM | POA: Diagnosis not present

## 2016-04-30 DIAGNOSIS — F419 Anxiety disorder, unspecified: Secondary | ICD-10-CM | POA: Diagnosis not present

## 2016-05-02 DIAGNOSIS — I10 Essential (primary) hypertension: Secondary | ICD-10-CM | POA: Diagnosis not present

## 2016-05-02 DIAGNOSIS — D649 Anemia, unspecified: Secondary | ICD-10-CM | POA: Diagnosis not present

## 2016-05-02 DIAGNOSIS — F419 Anxiety disorder, unspecified: Secondary | ICD-10-CM | POA: Diagnosis not present

## 2016-05-02 DIAGNOSIS — E782 Mixed hyperlipidemia: Secondary | ICD-10-CM | POA: Diagnosis not present

## 2016-05-02 DIAGNOSIS — R739 Hyperglycemia, unspecified: Secondary | ICD-10-CM | POA: Diagnosis not present

## 2016-05-02 DIAGNOSIS — Z0000 Encounter for general adult medical examination without abnormal findings: Secondary | ICD-10-CM | POA: Diagnosis not present

## 2016-05-02 DIAGNOSIS — R413 Other amnesia: Secondary | ICD-10-CM | POA: Diagnosis not present

## 2016-05-02 DIAGNOSIS — N183 Chronic kidney disease, stage 3 (moderate): Secondary | ICD-10-CM | POA: Diagnosis not present

## 2016-05-06 DIAGNOSIS — D519 Vitamin B12 deficiency anemia, unspecified: Secondary | ICD-10-CM | POA: Diagnosis not present

## 2016-05-06 DIAGNOSIS — F419 Anxiety disorder, unspecified: Secondary | ICD-10-CM | POA: Diagnosis not present

## 2016-05-06 DIAGNOSIS — Z9181 History of falling: Secondary | ICD-10-CM | POA: Diagnosis not present

## 2016-05-06 DIAGNOSIS — F329 Major depressive disorder, single episode, unspecified: Secondary | ICD-10-CM | POA: Diagnosis not present

## 2016-05-06 DIAGNOSIS — R2689 Other abnormalities of gait and mobility: Secondary | ICD-10-CM | POA: Diagnosis not present

## 2016-05-06 DIAGNOSIS — N183 Chronic kidney disease, stage 3 (moderate): Secondary | ICD-10-CM | POA: Diagnosis not present

## 2016-05-06 DIAGNOSIS — G309 Alzheimer's disease, unspecified: Secondary | ICD-10-CM | POA: Diagnosis not present

## 2016-05-06 DIAGNOSIS — F0281 Dementia in other diseases classified elsewhere with behavioral disturbance: Secondary | ICD-10-CM | POA: Diagnosis not present

## 2016-05-06 DIAGNOSIS — I129 Hypertensive chronic kidney disease with stage 1 through stage 4 chronic kidney disease, or unspecified chronic kidney disease: Secondary | ICD-10-CM | POA: Diagnosis not present

## 2016-05-09 DIAGNOSIS — N183 Chronic kidney disease, stage 3 (moderate): Secondary | ICD-10-CM | POA: Diagnosis not present

## 2016-05-09 DIAGNOSIS — F0281 Dementia in other diseases classified elsewhere with behavioral disturbance: Secondary | ICD-10-CM | POA: Diagnosis not present

## 2016-05-09 DIAGNOSIS — R2689 Other abnormalities of gait and mobility: Secondary | ICD-10-CM | POA: Diagnosis not present

## 2016-05-09 DIAGNOSIS — G309 Alzheimer's disease, unspecified: Secondary | ICD-10-CM | POA: Diagnosis not present

## 2016-05-09 DIAGNOSIS — R739 Hyperglycemia, unspecified: Secondary | ICD-10-CM | POA: Diagnosis not present

## 2016-05-09 DIAGNOSIS — F419 Anxiety disorder, unspecified: Secondary | ICD-10-CM | POA: Diagnosis not present

## 2016-05-09 DIAGNOSIS — D519 Vitamin B12 deficiency anemia, unspecified: Secondary | ICD-10-CM | POA: Diagnosis not present

## 2016-05-09 DIAGNOSIS — E782 Mixed hyperlipidemia: Secondary | ICD-10-CM | POA: Diagnosis not present

## 2016-05-09 DIAGNOSIS — I129 Hypertensive chronic kidney disease with stage 1 through stage 4 chronic kidney disease, or unspecified chronic kidney disease: Secondary | ICD-10-CM | POA: Diagnosis not present

## 2016-05-09 DIAGNOSIS — F329 Major depressive disorder, single episode, unspecified: Secondary | ICD-10-CM | POA: Diagnosis not present

## 2016-05-09 DIAGNOSIS — D649 Anemia, unspecified: Secondary | ICD-10-CM | POA: Diagnosis not present

## 2016-05-09 DIAGNOSIS — Z9181 History of falling: Secondary | ICD-10-CM | POA: Diagnosis not present

## 2016-05-09 DIAGNOSIS — R413 Other amnesia: Secondary | ICD-10-CM | POA: Diagnosis not present

## 2016-05-09 DIAGNOSIS — I10 Essential (primary) hypertension: Secondary | ICD-10-CM | POA: Diagnosis not present

## 2016-05-13 DIAGNOSIS — G309 Alzheimer's disease, unspecified: Secondary | ICD-10-CM | POA: Diagnosis not present

## 2016-05-13 DIAGNOSIS — F419 Anxiety disorder, unspecified: Secondary | ICD-10-CM | POA: Diagnosis not present

## 2016-05-13 DIAGNOSIS — N183 Chronic kidney disease, stage 3 (moderate): Secondary | ICD-10-CM | POA: Diagnosis not present

## 2016-05-13 DIAGNOSIS — D519 Vitamin B12 deficiency anemia, unspecified: Secondary | ICD-10-CM | POA: Diagnosis not present

## 2016-05-13 DIAGNOSIS — I129 Hypertensive chronic kidney disease with stage 1 through stage 4 chronic kidney disease, or unspecified chronic kidney disease: Secondary | ICD-10-CM | POA: Diagnosis not present

## 2016-05-13 DIAGNOSIS — Z9181 History of falling: Secondary | ICD-10-CM | POA: Diagnosis not present

## 2016-05-13 DIAGNOSIS — R2689 Other abnormalities of gait and mobility: Secondary | ICD-10-CM | POA: Diagnosis not present

## 2016-05-13 DIAGNOSIS — F329 Major depressive disorder, single episode, unspecified: Secondary | ICD-10-CM | POA: Diagnosis not present

## 2016-05-13 DIAGNOSIS — F0281 Dementia in other diseases classified elsewhere with behavioral disturbance: Secondary | ICD-10-CM | POA: Diagnosis not present

## 2016-05-17 DIAGNOSIS — R2689 Other abnormalities of gait and mobility: Secondary | ICD-10-CM | POA: Diagnosis not present

## 2016-05-17 DIAGNOSIS — F0281 Dementia in other diseases classified elsewhere with behavioral disturbance: Secondary | ICD-10-CM | POA: Diagnosis not present

## 2016-05-17 DIAGNOSIS — I129 Hypertensive chronic kidney disease with stage 1 through stage 4 chronic kidney disease, or unspecified chronic kidney disease: Secondary | ICD-10-CM | POA: Diagnosis not present

## 2016-05-17 DIAGNOSIS — Z9181 History of falling: Secondary | ICD-10-CM | POA: Diagnosis not present

## 2016-05-17 DIAGNOSIS — G309 Alzheimer's disease, unspecified: Secondary | ICD-10-CM | POA: Diagnosis not present

## 2016-05-17 DIAGNOSIS — D519 Vitamin B12 deficiency anemia, unspecified: Secondary | ICD-10-CM | POA: Diagnosis not present

## 2016-05-17 DIAGNOSIS — F419 Anxiety disorder, unspecified: Secondary | ICD-10-CM | POA: Diagnosis not present

## 2016-05-17 DIAGNOSIS — N183 Chronic kidney disease, stage 3 (moderate): Secondary | ICD-10-CM | POA: Diagnosis not present

## 2016-05-17 DIAGNOSIS — F329 Major depressive disorder, single episode, unspecified: Secondary | ICD-10-CM | POA: Diagnosis not present

## 2016-05-20 DIAGNOSIS — G309 Alzheimer's disease, unspecified: Secondary | ICD-10-CM | POA: Diagnosis not present

## 2016-05-20 DIAGNOSIS — F419 Anxiety disorder, unspecified: Secondary | ICD-10-CM | POA: Diagnosis not present

## 2016-05-20 DIAGNOSIS — F329 Major depressive disorder, single episode, unspecified: Secondary | ICD-10-CM | POA: Diagnosis not present

## 2016-05-20 DIAGNOSIS — R2689 Other abnormalities of gait and mobility: Secondary | ICD-10-CM | POA: Diagnosis not present

## 2016-05-20 DIAGNOSIS — D519 Vitamin B12 deficiency anemia, unspecified: Secondary | ICD-10-CM | POA: Diagnosis not present

## 2016-05-20 DIAGNOSIS — I129 Hypertensive chronic kidney disease with stage 1 through stage 4 chronic kidney disease, or unspecified chronic kidney disease: Secondary | ICD-10-CM | POA: Diagnosis not present

## 2016-05-20 DIAGNOSIS — F0281 Dementia in other diseases classified elsewhere with behavioral disturbance: Secondary | ICD-10-CM | POA: Diagnosis not present

## 2016-05-20 DIAGNOSIS — N183 Chronic kidney disease, stage 3 (moderate): Secondary | ICD-10-CM | POA: Diagnosis not present

## 2016-05-20 DIAGNOSIS — Z9181 History of falling: Secondary | ICD-10-CM | POA: Diagnosis not present

## 2016-05-22 DIAGNOSIS — R2689 Other abnormalities of gait and mobility: Secondary | ICD-10-CM | POA: Diagnosis not present

## 2016-05-22 DIAGNOSIS — F0281 Dementia in other diseases classified elsewhere with behavioral disturbance: Secondary | ICD-10-CM | POA: Diagnosis not present

## 2016-05-22 DIAGNOSIS — I129 Hypertensive chronic kidney disease with stage 1 through stage 4 chronic kidney disease, or unspecified chronic kidney disease: Secondary | ICD-10-CM | POA: Diagnosis not present

## 2016-05-22 DIAGNOSIS — F329 Major depressive disorder, single episode, unspecified: Secondary | ICD-10-CM | POA: Diagnosis not present

## 2016-05-22 DIAGNOSIS — D519 Vitamin B12 deficiency anemia, unspecified: Secondary | ICD-10-CM | POA: Diagnosis not present

## 2016-05-22 DIAGNOSIS — Z9181 History of falling: Secondary | ICD-10-CM | POA: Diagnosis not present

## 2016-05-22 DIAGNOSIS — G309 Alzheimer's disease, unspecified: Secondary | ICD-10-CM | POA: Diagnosis not present

## 2016-05-22 DIAGNOSIS — N183 Chronic kidney disease, stage 3 (moderate): Secondary | ICD-10-CM | POA: Diagnosis not present

## 2016-05-22 DIAGNOSIS — F419 Anxiety disorder, unspecified: Secondary | ICD-10-CM | POA: Diagnosis not present

## 2016-08-29 DIAGNOSIS — I10 Essential (primary) hypertension: Secondary | ICD-10-CM | POA: Diagnosis not present

## 2016-08-29 DIAGNOSIS — R413 Other amnesia: Secondary | ICD-10-CM | POA: Diagnosis not present

## 2016-08-29 DIAGNOSIS — N183 Chronic kidney disease, stage 3 (moderate): Secondary | ICD-10-CM | POA: Diagnosis not present

## 2016-08-29 DIAGNOSIS — D649 Anemia, unspecified: Secondary | ICD-10-CM | POA: Diagnosis not present

## 2016-08-29 DIAGNOSIS — R739 Hyperglycemia, unspecified: Secondary | ICD-10-CM | POA: Diagnosis not present

## 2016-08-29 DIAGNOSIS — E782 Mixed hyperlipidemia: Secondary | ICD-10-CM | POA: Diagnosis not present

## 2016-09-05 DIAGNOSIS — F419 Anxiety disorder, unspecified: Secondary | ICD-10-CM | POA: Diagnosis not present

## 2016-09-05 DIAGNOSIS — E782 Mixed hyperlipidemia: Secondary | ICD-10-CM | POA: Diagnosis not present

## 2016-09-05 DIAGNOSIS — Z0000 Encounter for general adult medical examination without abnormal findings: Secondary | ICD-10-CM | POA: Diagnosis not present

## 2016-09-05 DIAGNOSIS — F329 Major depressive disorder, single episode, unspecified: Secondary | ICD-10-CM | POA: Diagnosis not present

## 2016-09-05 DIAGNOSIS — I10 Essential (primary) hypertension: Secondary | ICD-10-CM | POA: Diagnosis not present

## 2016-09-05 DIAGNOSIS — N183 Chronic kidney disease, stage 3 (moderate): Secondary | ICD-10-CM | POA: Diagnosis not present

## 2016-12-11 DIAGNOSIS — F329 Major depressive disorder, single episode, unspecified: Secondary | ICD-10-CM | POA: Diagnosis not present

## 2016-12-30 DIAGNOSIS — Z0000 Encounter for general adult medical examination without abnormal findings: Secondary | ICD-10-CM | POA: Diagnosis not present

## 2016-12-30 DIAGNOSIS — F419 Anxiety disorder, unspecified: Secondary | ICD-10-CM | POA: Diagnosis not present

## 2016-12-30 DIAGNOSIS — E782 Mixed hyperlipidemia: Secondary | ICD-10-CM | POA: Diagnosis not present

## 2016-12-30 DIAGNOSIS — N183 Chronic kidney disease, stage 3 (moderate): Secondary | ICD-10-CM | POA: Diagnosis not present

## 2016-12-30 DIAGNOSIS — R739 Hyperglycemia, unspecified: Secondary | ICD-10-CM | POA: Diagnosis not present

## 2016-12-30 DIAGNOSIS — I10 Essential (primary) hypertension: Secondary | ICD-10-CM | POA: Diagnosis not present

## 2017-01-06 DIAGNOSIS — R5383 Other fatigue: Secondary | ICD-10-CM | POA: Diagnosis not present

## 2017-01-06 DIAGNOSIS — E782 Mixed hyperlipidemia: Secondary | ICD-10-CM | POA: Diagnosis not present

## 2017-01-06 DIAGNOSIS — R413 Other amnesia: Secondary | ICD-10-CM | POA: Diagnosis not present

## 2017-01-06 DIAGNOSIS — F419 Anxiety disorder, unspecified: Secondary | ICD-10-CM | POA: Diagnosis not present

## 2017-01-06 DIAGNOSIS — R5381 Other malaise: Secondary | ICD-10-CM | POA: Diagnosis not present

## 2017-01-06 DIAGNOSIS — I10 Essential (primary) hypertension: Secondary | ICD-10-CM | POA: Diagnosis not present

## 2017-01-06 DIAGNOSIS — D649 Anemia, unspecified: Secondary | ICD-10-CM | POA: Diagnosis not present

## 2017-01-06 DIAGNOSIS — N183 Chronic kidney disease, stage 3 (moderate): Secondary | ICD-10-CM | POA: Diagnosis not present

## 2017-02-04 DIAGNOSIS — R413 Other amnesia: Secondary | ICD-10-CM | POA: Diagnosis not present

## 2017-02-04 DIAGNOSIS — I10 Essential (primary) hypertension: Secondary | ICD-10-CM | POA: Diagnosis not present

## 2017-02-04 DIAGNOSIS — N183 Chronic kidney disease, stage 3 (moderate): Secondary | ICD-10-CM | POA: Diagnosis not present

## 2017-03-20 DIAGNOSIS — F419 Anxiety disorder, unspecified: Secondary | ICD-10-CM | POA: Diagnosis not present

## 2017-03-20 DIAGNOSIS — R413 Other amnesia: Secondary | ICD-10-CM | POA: Diagnosis not present

## 2017-03-20 DIAGNOSIS — R42 Dizziness and giddiness: Secondary | ICD-10-CM | POA: Diagnosis not present

## 2017-03-20 DIAGNOSIS — E782 Mixed hyperlipidemia: Secondary | ICD-10-CM | POA: Diagnosis not present

## 2017-03-20 DIAGNOSIS — N183 Chronic kidney disease, stage 3 (moderate): Secondary | ICD-10-CM | POA: Diagnosis not present

## 2017-03-20 DIAGNOSIS — I10 Essential (primary) hypertension: Secondary | ICD-10-CM | POA: Diagnosis not present

## 2017-04-14 DIAGNOSIS — F0390 Unspecified dementia without behavioral disturbance: Secondary | ICD-10-CM | POA: Diagnosis not present

## 2017-06-02 DIAGNOSIS — F0390 Unspecified dementia without behavioral disturbance: Secondary | ICD-10-CM | POA: Diagnosis not present

## 2017-06-02 DIAGNOSIS — F0281 Dementia in other diseases classified elsewhere with behavioral disturbance: Secondary | ICD-10-CM | POA: Diagnosis not present

## 2017-06-02 DIAGNOSIS — Z79899 Other long term (current) drug therapy: Secondary | ICD-10-CM | POA: Diagnosis not present

## 2017-07-14 DIAGNOSIS — N183 Chronic kidney disease, stage 3 (moderate): Secondary | ICD-10-CM | POA: Diagnosis not present

## 2017-07-14 DIAGNOSIS — I10 Essential (primary) hypertension: Secondary | ICD-10-CM | POA: Diagnosis not present

## 2017-07-14 DIAGNOSIS — R739 Hyperglycemia, unspecified: Secondary | ICD-10-CM | POA: Diagnosis not present

## 2017-07-14 DIAGNOSIS — F419 Anxiety disorder, unspecified: Secondary | ICD-10-CM | POA: Diagnosis not present

## 2017-07-14 DIAGNOSIS — Z0000 Encounter for general adult medical examination without abnormal findings: Secondary | ICD-10-CM | POA: Diagnosis not present

## 2017-07-14 DIAGNOSIS — E782 Mixed hyperlipidemia: Secondary | ICD-10-CM | POA: Diagnosis not present

## 2017-07-14 DIAGNOSIS — D649 Anemia, unspecified: Secondary | ICD-10-CM | POA: Diagnosis not present

## 2017-07-14 DIAGNOSIS — R413 Other amnesia: Secondary | ICD-10-CM | POA: Diagnosis not present

## 2017-08-01 DIAGNOSIS — R413 Other amnesia: Secondary | ICD-10-CM | POA: Diagnosis not present

## 2017-08-01 DIAGNOSIS — F419 Anxiety disorder, unspecified: Secondary | ICD-10-CM | POA: Diagnosis not present

## 2017-08-01 DIAGNOSIS — I959 Hypotension, unspecified: Secondary | ICD-10-CM | POA: Diagnosis not present

## 2017-08-01 DIAGNOSIS — N183 Chronic kidney disease, stage 3 (moderate): Secondary | ICD-10-CM | POA: Diagnosis not present

## 2017-08-01 DIAGNOSIS — E782 Mixed hyperlipidemia: Secondary | ICD-10-CM | POA: Diagnosis not present

## 2017-08-01 DIAGNOSIS — J208 Acute bronchitis due to other specified organisms: Secondary | ICD-10-CM | POA: Diagnosis not present

## 2017-11-21 DIAGNOSIS — R0989 Other specified symptoms and signs involving the circulatory and respiratory systems: Secondary | ICD-10-CM | POA: Diagnosis not present

## 2017-11-21 DIAGNOSIS — R2689 Other abnormalities of gait and mobility: Secondary | ICD-10-CM | POA: Diagnosis not present

## 2017-11-21 DIAGNOSIS — R413 Other amnesia: Secondary | ICD-10-CM | POA: Diagnosis not present

## 2017-11-21 DIAGNOSIS — R42 Dizziness and giddiness: Secondary | ICD-10-CM | POA: Diagnosis not present

## 2017-12-01 DIAGNOSIS — Z79899 Other long term (current) drug therapy: Secondary | ICD-10-CM | POA: Diagnosis not present

## 2017-12-01 DIAGNOSIS — F0281 Dementia in other diseases classified elsewhere with behavioral disturbance: Secondary | ICD-10-CM | POA: Diagnosis not present

## 2017-12-01 DIAGNOSIS — F0390 Unspecified dementia without behavioral disturbance: Secondary | ICD-10-CM | POA: Diagnosis not present

## 2017-12-11 DIAGNOSIS — R0989 Other specified symptoms and signs involving the circulatory and respiratory systems: Secondary | ICD-10-CM | POA: Diagnosis not present

## 2017-12-11 DIAGNOSIS — I6523 Occlusion and stenosis of bilateral carotid arteries: Secondary | ICD-10-CM | POA: Diagnosis not present

## 2017-12-20 ENCOUNTER — Emergency Department
Admission: EM | Admit: 2017-12-20 | Discharge: 2017-12-24 | Disposition: A | Discharge status: Home / Self Care | Payer: Medicare HMO | Attending: Emergency Medicine | Admitting: Emergency Medicine

## 2017-12-20 ENCOUNTER — Other Ambulatory Visit: Payer: Self-pay

## 2017-12-20 ENCOUNTER — Encounter: Payer: Self-pay | Admitting: Emergency Medicine

## 2017-12-20 DIAGNOSIS — Z87891 Personal history of nicotine dependence: Secondary | ICD-10-CM | POA: Insufficient documentation

## 2017-12-20 DIAGNOSIS — G309 Alzheimer's disease, unspecified: Secondary | ICD-10-CM

## 2017-12-20 DIAGNOSIS — F0281 Dementia in other diseases classified elsewhere with behavioral disturbance: Secondary | ICD-10-CM

## 2017-12-20 DIAGNOSIS — F0391 Unspecified dementia with behavioral disturbance: Secondary | ICD-10-CM | POA: Insufficient documentation

## 2017-12-20 DIAGNOSIS — I10 Essential (primary) hypertension: Secondary | ICD-10-CM | POA: Diagnosis not present

## 2017-12-20 DIAGNOSIS — G301 Alzheimer's disease with late onset: Secondary | ICD-10-CM | POA: Diagnosis not present

## 2017-12-20 DIAGNOSIS — F329 Major depressive disorder, single episode, unspecified: Secondary | ICD-10-CM | POA: Diagnosis not present

## 2017-12-20 HISTORY — DX: Unspecified dementia, unspecified severity, without behavioral disturbance, psychotic disturbance, mood disturbance, and anxiety: F03.90

## 2017-12-20 HISTORY — DX: Essential (primary) hypertension: I10

## 2017-12-20 HISTORY — DX: Deficiency of other specified B group vitamins: E53.8

## 2017-12-20 HISTORY — DX: Anxiety disorder, unspecified: F41.9

## 2017-12-20 LAB — CBC
HCT: 32.9 % — ABNORMAL LOW (ref 35.0–47.0)
HEMOGLOBIN: 11.1 g/dL — AB (ref 12.0–16.0)
MCH: 32.8 pg (ref 26.0–34.0)
MCHC: 33.7 g/dL (ref 32.0–36.0)
MCV: 97.1 fL (ref 80.0–100.0)
Platelets: 129 10*3/uL — ABNORMAL LOW (ref 150–440)
RBC: 3.38 MIL/uL — AB (ref 3.80–5.20)
RDW: 13.1 % (ref 11.5–14.5)
WBC: 5.5 10*3/uL (ref 3.6–11.0)

## 2017-12-20 LAB — URINE DRUG SCREEN, QUALITATIVE (ARMC ONLY)
Amphetamines, Ur Screen: NOT DETECTED
Benzodiazepine, Ur Scrn: POSITIVE — AB
Cannabinoid 50 Ng, Ur ~~LOC~~: NOT DETECTED
Cocaine Metabolite,Ur ~~LOC~~: NOT DETECTED
MDMA (ECSTASY) UR SCREEN: NOT DETECTED
METHADONE SCREEN, URINE: NOT DETECTED
Opiate, Ur Screen: NOT DETECTED
Phencyclidine (PCP) Ur S: NOT DETECTED
TRICYCLIC, UR SCREEN: NOT DETECTED

## 2017-12-20 LAB — COMPREHENSIVE METABOLIC PANEL
ALBUMIN: 4.3 g/dL (ref 3.5–5.0)
ALT: 18 U/L (ref 14–54)
ANION GAP: 8 (ref 5–15)
AST: 21 U/L (ref 15–41)
Alkaline Phosphatase: 37 U/L — ABNORMAL LOW (ref 38–126)
BUN: 47 mg/dL — AB (ref 6–20)
CHLORIDE: 112 mmol/L — AB (ref 101–111)
CO2: 21 mmol/L — AB (ref 22–32)
Calcium: 8.9 mg/dL (ref 8.9–10.3)
Creatinine, Ser: 1.96 mg/dL — ABNORMAL HIGH (ref 0.44–1.00)
GFR calc Af Amer: 26 mL/min — ABNORMAL LOW (ref >60)
GFR calc non Af Amer: 23 mL/min — ABNORMAL LOW (ref >60)
GLUCOSE: 107 mg/dL — AB (ref 65–99)
POTASSIUM: 3.9 mmol/L (ref 3.5–5.1)
SODIUM: 141 mmol/L (ref 135–145)
TOTAL PROTEIN: 7.4 g/dL (ref 6.5–8.1)
Total Bilirubin: 0.9 mg/dL (ref 0.3–1.2)

## 2017-12-20 LAB — ETHANOL: Alcohol, Ethyl (B): <10 mg/dL (ref <10)

## 2017-12-20 MED ORDER — FERROUS SULFATE 325 (65 FE) MG PO TABS
325.0000 mg | ORAL_TABLET | Freq: Every day | ORAL | Status: DC
  Administered 2017-12-21 – 2017-12-24 (×4): 325 mg via ORAL
  Filled 2017-12-20 (×4): qty 1

## 2017-12-20 MED ORDER — HALOPERIDOL 0.5 MG PO TABS
0.5000 mg | ORAL_TABLET | Freq: Three times a day (TID) | ORAL | Status: DC | PRN
  Filled 2017-12-20: qty 1

## 2017-12-20 MED ORDER — VITAMIN B-12 1000 MCG PO TABS
1000.0000 ug | ORAL_TABLET | Freq: Every day | ORAL | Status: DC
  Administered 2017-12-20 – 2017-12-24 (×5): 1000 ug via ORAL
  Filled 2017-12-20 (×6): qty 1

## 2017-12-20 MED ORDER — DONEPEZIL HCL 5 MG PO TABS
5.0000 mg | ORAL_TABLET | Freq: Every day | ORAL | Status: DC
  Administered 2017-12-20 – 2017-12-23 (×4): 5 mg via ORAL
  Filled 2017-12-20 (×4): qty 1

## 2017-12-20 MED ORDER — LOSARTAN POTASSIUM 50 MG PO TABS
50.0000 mg | ORAL_TABLET | Freq: Every day | ORAL | Status: DC
  Administered 2017-12-20 – 2017-12-21 (×2): 50 mg via ORAL
  Filled 2017-12-20 (×4): qty 1

## 2017-12-20 MED ORDER — QUETIAPINE FUMARATE 25 MG PO TABS
25.0000 mg | ORAL_TABLET | Freq: Two times a day (BID) | ORAL | Status: DC
  Administered 2017-12-20 – 2017-12-24 (×8): 25 mg via ORAL
  Filled 2017-12-20 (×8): qty 1

## 2017-12-20 MED ORDER — LORAZEPAM 0.5 MG PO TABS
0.5000 mg | ORAL_TABLET | Freq: Two times a day (BID) | ORAL | Status: DC | PRN
  Administered 2017-12-20 – 2017-12-22 (×2): 0.5 mg via ORAL
  Filled 2017-12-20 (×2): qty 1

## 2017-12-20 MED ORDER — ATORVASTATIN CALCIUM 20 MG PO TABS
10.0000 mg | ORAL_TABLET | Freq: Every day | ORAL | Status: DC
  Administered 2017-12-20 – 2017-12-24 (×5): 10 mg via ORAL
  Filled 2017-12-20 (×6): qty 1

## 2017-12-20 NOTE — BH Assessment (Signed)
Referrals sent to the following for gero-psych inpatient treatment:  [facility] :[place]: [place]: [place]: [place]: [place]: [place]: [place]: [place]: [place]: [place]: [phone]  [place]: [phone]
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
## 2017-12-20 NOTE — BH Assessment (Signed)
Per [facility], patient needs inpatient gero psych.

## 2017-12-20 NOTE — ED Notes (Signed)
ivc 

## 2017-12-20 NOTE — ED Notes (Signed)
SOC machine set up in pt room.

## 2017-12-20 NOTE — ED Notes (Signed)
SOC in progress.  

## 2017-12-20 NOTE — ED Notes (Signed)
SOC recommendations:  1. Inpatient geriatric psychiatric unit for further evaluation and stabilization of dementia with rule out mood disorder and/or behavioral disturbance.  2. Medication recommendations: continue current outpatient medicaitons while pt remains in ED. haloperidal 0.5mg  PO Q8 hrs PRN agitation while pt remains in ED, lorazepam 1mg  PO/IM/IV q6 hr PRN anxiety while pt remains in ED  3. If durable POA is not already determined, recommend social services/consult for initiation of legal process determining durable POA/fiduciary management.
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
## 2017-12-20 NOTE — ED Notes (Signed)
Pt sleeping in bed safe at this time.

## 2017-12-20 NOTE — ED Notes (Signed)
TTS at bedside. 

## 2017-12-20 NOTE — ED Provider Notes (Signed)
-----------------------------------------   5:11 PM on 12/20/2017 -----------------------------------------  Patient has been seen by the specialist on-call psychiatry as they recommend the patient be placed under IVC for geriatric psychiatric placement.   Harvest Dark, MD 12/20/17 [phone]

## 2017-12-20 NOTE — BH Assessment (Signed)
Pt's daughter and legal guardian [name]) called to get an update on pt's status. Writer confirmed for daughter that the pt has been referred out to several geriatric psych hospitals and that she will be notified if/when the pt is accepted.

## 2017-12-20 NOTE — ED Notes (Signed)
Patient is alert and oriented to person.  Patient presents with calm affect and mood.  Denies auditory and visual hallucinations.  Patient is in the hallway sitting on the bed.  No behavioral issues noted at this time.  Routine safety checks maintained every 15 minutes.

## 2017-12-21 NOTE — ED Notes (Signed)
Hourly rounding reveals patient sleeping in room. No complaints, stable, in no acute distress. Q15 minute rounds and monitoring via Rover and Officer to continue.  

## 2017-12-21 NOTE — BH Assessment (Signed)
Referrals sent to the following for gero-psych inpatient treatment:   [facility]: [phone]- Per Gerhard Perches, no available beds.    [facility]: [phone], Per Michel Bickers, no available beds.    [place]: [phone]- Unable to speak with anyone.    [place]: [place]: [phone], Per Joy, no available beds.    [facility]: [phone]- Per Quillian Quince, requested fax be re-sent.    [facility]: [phone]- No answer, left HIPPA compliant message for return call.    [facility]: [phone], Per Seth Bake, denied due dementia.    [place]: [phone], Per Maudie Mercury, no available beds.    [facility]: [phone]- Per [place], denied due to acuity.    [facility]: [phone]: Per, left HIPPA compliant message for return call.    [place]: [phone]- Per Shirlee Limerick, no beds available, will keep referral open.

## 2017-12-21 NOTE — ED Provider Notes (Signed)
-----------------------------------------   5:11 AM on 12/21/2017 -----------------------------------------   Blood pressure (!) 137/53, pulse 66, temperature 98.6 F (37 C), temperature source Oral, resp. rate 15, height 5\' 2"  (1.575 m), weight 72.6 kg (160 lb), SpO2 100 %.  The patient had no acute events since last update.  Calm and cooperative at this time.  Disposition is pending Psychiatry/Behavioral Medicine team recommendations.    Arta Silence, MD 12/21/17 [phone]

## 2017-12-21 NOTE — ED Notes (Signed)
Report to include Situation, Background, Assessment, and Recommendations received from [facility]. Patient alert, warm and dry, in no acute distress. Patient denies SI, HI, AVH and pain. Patient made aware of Q15 minute rounds and Engineer, drilling presence for their safety. Patient instructed to come to me with needs or concerns.

## 2017-12-21 NOTE — ED Notes (Signed)
Hourly rounding reveals patient in room. No complaints, stable, in no acute distress. Q15 minute rounds and monitoring via Rover and Officer to continue.   

## 2017-12-21 NOTE — BH Assessment (Signed)
This Probation officer re-faxed gero psych referral to [facility] @ [phone] per Quillian Quince (intake clinician) request.

## 2017-12-22 DIAGNOSIS — G309 Alzheimer's disease, unspecified: Secondary | ICD-10-CM

## 2017-12-22 DIAGNOSIS — F0281 Dementia in other diseases classified elsewhere with behavioral disturbance: Secondary | ICD-10-CM

## 2017-12-22 DIAGNOSIS — G301 Alzheimer's disease with late onset: Secondary | ICD-10-CM | POA: Diagnosis not present

## 2017-12-22 LAB — URINALYSIS, COMPLETE (UACMP) WITH MICROSCOPIC
Bilirubin Urine: NEGATIVE
Glucose, UA: NEGATIVE mg/dL
Hgb urine dipstick: NEGATIVE
Ketones, ur: NEGATIVE mg/dL
Nitrite: NEGATIVE
PH: 5 (ref 5.0–8.0)
Protein, ur: NEGATIVE mg/dL
SPECIFIC GRAVITY, URINE: 1.015 (ref 1.005–1.030)

## 2017-12-22 MED ORDER — LOSARTAN POTASSIUM 50 MG PO TABS
25.0000 mg | ORAL_TABLET | Freq: Every day | ORAL | Status: DC
  Administered 2017-12-22 – 2017-12-24 (×3): 25 mg via ORAL
  Filled 2017-12-22 (×3): qty 1

## 2017-12-22 NOTE — ED Provider Notes (Signed)
-----------------------------------------   12:09 AM on 12/22/2017 -----------------------------------------   Blood pressure 133/78, pulse 62, temperature 98.5 F (36.9 C), temperature source Oral, resp. rate 18, height 5\' 2"  (1.575 m), weight 72.6 kg (160 lb), SpO2 100 %.  The patient had no acute events since last update.  Calm and cooperative at this time.  Disposition is pending Psychiatry/Behavioral Medicine team recommendations.     Darel Hong, MD 12/22/17 [phone]

## 2017-12-22 NOTE — ED Notes (Signed)
Hourly rounding reveals patient in room. No complaints, stable, in no acute distress. Q15 minute rounds and monitoring via Rover and Officer to continue.   

## 2017-12-22 NOTE — ED Notes (Signed)
Hourly rounding reveals patient sleeping in room. No complaints, stable, in no acute distress. Q15 minute rounds and monitoring via Rover and Officer to continue.  
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
## 2017-12-22 NOTE — ED Notes (Signed)
Hourly rounding reveals patient sleeping in room. No complaints, stable, in no acute distress. Q15 minute rounds and monitoring via Security Cameras to continue. 

## 2017-12-22 NOTE — ED Notes (Signed)
Pt's guardian, Knute Neu, called back and received daily update. Was offered opportunity to ask questions and speak with her mother; told to call back if she would like to do so later.

## 2017-12-22 NOTE — ED Notes (Signed)
Pt was transferred to [facility], oriented to unit, and given breakfast (ate 80%). She ambulated to bathroom without assistance, but appeared slightly unsteady at moments. She denied any history of falls. Pt denied SI/HI/AVH. She was oriented to self but not time/place. Pt was pleasant and cooperative. Will continue to monitor for needs/safety.

## 2017-12-22 NOTE — ED Notes (Signed)
Hourly rounding reveals patient in room. No complaints, stable, in no acute distress. Q15 minute rounds and monitoring via Security Cameras to continue.Snack and beverage given. 

## 2017-12-22 NOTE — ED Notes (Signed)
Left HIPAA-compliant message for Leah Waller, pt's guardian, at [phone], with return phone number so she can get a daily update.

## 2017-12-22 NOTE — ED Notes (Signed)
Report to include Situation, Background, Assessment, and Recommendations received from [place]. Patient alert, warm and dry, in no acute distress. Patient denies SI, HI, AVH and pain. Patient made aware of Q15 minute rounds and security cameras for their safety. Patient instructed to come to me with needs or concerns.

## 2017-12-22 NOTE — ED Notes (Signed)
Pt at 20% of lunch.

## 2017-12-22 NOTE — ED Notes (Signed)
Pt's ambulation is much more steady.

## 2017-12-22 NOTE — ED Notes (Signed)
Pt has been growing increasingly anxious as she repeatedly asks if she can call people to come pick her up and other requests that aren't possible. Pt requires frequent redirection and reassurance, but remains pleasant and cooperative.

## 2017-12-22 NOTE — ED Provider Notes (Signed)
Leah Waller let me know patient's diastolic blood pressure is low her systolic is 449 patient is asymptomatic has no pain no fever is otherwise doing well.  Will repeat her blood pressure again in a  about 2 hours.   Leah Polio, MD 12/22/17 [phone]

## 2017-12-22 NOTE — ED Notes (Signed)

## 2017-12-23 DIAGNOSIS — F0281 Dementia in other diseases classified elsewhere with behavioral disturbance: Secondary | ICD-10-CM | POA: Diagnosis not present

## 2017-12-23 DIAGNOSIS — G301 Alzheimer's disease with late onset: Secondary | ICD-10-CM | POA: Diagnosis not present

## 2017-12-23 NOTE — ED Notes (Signed)

## 2017-12-23 NOTE — ED Notes (Addendum)
Hourly rounding reveals patient sleeping in room. No complaints, stable, in no acute distress. Q15 minute rounds and monitoring via Rover and Officer to continue.  

## 2017-12-23 NOTE — ED Provider Notes (Signed)
Patient has been cleared by Psychiatry for discharge   Earleen Newport, MD 12/23/17 1049

## 2017-12-23 NOTE — ED Notes (Signed)
TTS [place] and SW [place] informed of daughter refusal of pt's discharge. SW will contact daughter to update her on d/c process.

## 2017-12-23 NOTE — ED Notes (Signed)
Pt. Alert and oriented, warm and dry, in no distress. Pt. Denies SI, HI, and AVH. Pt. Encouraged to let nursing staff know of any concerns or needs. 

## 2017-12-23 NOTE — ED Notes (Signed)
Pt's BP 146/62, HR 57. Dr Jimmye Norman notified, no new orders received.
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
## 2017-12-23 NOTE — ED Notes (Signed)
Pt given breakfast tray

## 2017-12-23 NOTE — ED Notes (Signed)
Hourly rounding reveals patient sleeping in room. No complaints, stable, in no acute distress. Q15 minute rounds and monitoring via Rover and Officer to continue.  

## 2017-12-23 NOTE — Clinical Social Work Note (Signed)
CSW received call from [facility], stating patient's daughter/legal guardian [name] is refusing to agree to patient being discharged back to her family care home because she feels patient needs geri-psych placement. CSW attempted x2 to contact Ms. Rayford at [phone]. No answer, but left a voicemails. CSW continuing to follow for discharge needs.   Oretha Ellis, Latanya Presser, [place] Worker-Emergency Department [phone]

## 2017-12-23 NOTE — BH Assessment (Signed)
Per Dr. Weber Cooks, patient no longer meet inpatient criteria. Patient can discharge from [facility].  Writer received phone call from patient's nurse Earlie Server) stating the patient's daughter/guardian stated she was not going to get the patient from the ER and she was unsafe to be discharge. Writer advised the nurse to talk with ER Social Worker due patient being Psych Cleared and it's currently a placement concern and not Psych Inpatient.

## 2017-12-23 NOTE — BH Assessment (Signed)
Writer followed up with referrals;  Leah Waller [phone]), left message requesting a return phone call.  [facility] ([phone]), Denied due to dementia.

## 2017-12-23 NOTE — ED Provider Notes (Signed)
-----------------------------------------   8:03 AM on 12/23/2017 -----------------------------------------   Blood pressure (!) 157/60, pulse 69, temperature 98.4 F (36.9 C), temperature source Oral, resp. rate 17, height 5\' 2"  (1.575 m), weight 72.6 kg (160 lb), SpO2 99 %.  The patient had no acute events since last update.  Calm and cooperative at this time.  Disposition is pending Psychiatry/Behavioral Medicine team recommendations.  Patient awaiting acceptance to a geriatric psychiatric facility.   Loney Hering, MD 12/23/17 [phone]

## 2017-12-23 NOTE — ED Notes (Signed)
VOL/Social work pending placement

## 2017-12-23 NOTE — ED Notes (Signed)
Pt's daughter [name] called on [phone] to be updated on pt's discharge. Unable to reach her and VM left with # for her to call [facility] ED.

## 2017-12-23 NOTE — ED Notes (Signed)
Pt's daughter [name] called back. She states her mother is playing games and needs to be placed in a facility. Daughter states mother cannot be discharged back to the care home.

## 2017-12-24 NOTE — ED Notes (Signed)
Pt discharged from ED with staff from family care home ([facility]). Patient is alert and oriented to person. Patient presents with calm affect and mood.  Denies auditory and visual hallucinations. No behavioral issues noted at this time. Vital signs taken, and patient verbalized the belongings given to her, are hers.

## 2017-12-24 NOTE — ED Provider Notes (Signed)
-----------------------------------------   12:50 AM on 12/24/2017 -----------------------------------------   Blood pressure (!) 146/62, pulse (!) 57, temperature 98.5 F (36.9 C), temperature source Oral, resp. rate 16, height 5\' 2"  (1.575 m), weight 72.6 kg (160 lb), SpO2 100 %.  The patient had no acute events since last update.  Calm and cooperative at this time.  Cleared by psychiatry and pending placement by social work.   Darel Hong, MD 12/24/17 [phone]

## 2017-12-24 NOTE — BH Assessment (Signed)
This Probation officer received call from patients daughter [name] stating she was trying to contact patient's nurse Dorothy yesterday to come and visit patient but was unable to reach her. Writer informed daughter visiting hours are from 10am-12pm and 4pm-6pm. Daughter stated she will come and visit mother today.
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
## 2017-12-24 NOTE — ED Notes (Signed)
Patient refused shower at this time will ask again,notified nurse Donneta Romberg

## 2017-12-24 NOTE — ED Notes (Signed)
BEHAVIORAL HEALTH ROUNDING Patient sleeping: No. Patient alert and oriented: yes Behavior appropriate: Yes.  ; If no, describe:  Nutrition and fluids offered: yes Toileting and hygiene offered: Yes  Sitter present: q15 minute observations and security monitoring Law enforcement present: Yes    

## 2018-02-13 DIAGNOSIS — E782 Mixed hyperlipidemia: Secondary | ICD-10-CM | POA: Diagnosis not present

## 2018-02-13 DIAGNOSIS — R413 Other amnesia: Secondary | ICD-10-CM | POA: Diagnosis not present

## 2018-02-13 DIAGNOSIS — F0281 Dementia in other diseases classified elsewhere with behavioral disturbance: Secondary | ICD-10-CM | POA: Diagnosis not present

## 2018-02-13 DIAGNOSIS — E860 Dehydration: Secondary | ICD-10-CM | POA: Diagnosis not present

## 2018-02-13 DIAGNOSIS — D649 Anemia, unspecified: Secondary | ICD-10-CM | POA: Diagnosis not present

## 2018-02-13 DIAGNOSIS — G301 Alzheimer's disease with late onset: Secondary | ICD-10-CM | POA: Diagnosis not present

## 2018-02-13 DIAGNOSIS — I10 Essential (primary) hypertension: Secondary | ICD-10-CM | POA: Diagnosis not present

## 2018-02-13 DIAGNOSIS — N183 Chronic kidney disease, stage 3 (moderate): Secondary | ICD-10-CM | POA: Diagnosis not present

## 2018-02-24 ENCOUNTER — Emergency Department
Admission: EM | Admit: 2018-02-24 | Discharge: 2018-02-24 | Disposition: A | Discharge status: Home / Self Care | Payer: Medicare HMO | Attending: Emergency Medicine | Admitting: Emergency Medicine

## 2018-02-24 ENCOUNTER — Emergency Department: Payer: Medicare HMO

## 2018-02-24 DIAGNOSIS — I10 Essential (primary) hypertension: Secondary | ICD-10-CM | POA: Diagnosis not present

## 2018-02-24 DIAGNOSIS — G309 Alzheimer's disease, unspecified: Secondary | ICD-10-CM | POA: Diagnosis not present

## 2018-02-24 DIAGNOSIS — S3992XA Unspecified injury of lower back, initial encounter: Secondary | ICD-10-CM | POA: Diagnosis not present

## 2018-02-24 DIAGNOSIS — M25551 Pain in right hip: Secondary | ICD-10-CM | POA: Diagnosis not present

## 2018-02-24 DIAGNOSIS — Z79899 Other long term (current) drug therapy: Secondary | ICD-10-CM | POA: Diagnosis not present

## 2018-02-24 DIAGNOSIS — S299XXA Unspecified injury of thorax, initial encounter: Secondary | ICD-10-CM | POA: Diagnosis present

## 2018-02-24 DIAGNOSIS — R4182 Altered mental status, unspecified: Secondary | ICD-10-CM | POA: Diagnosis not present

## 2018-02-24 DIAGNOSIS — Y999 Unspecified external cause status: Secondary | ICD-10-CM | POA: Diagnosis not present

## 2018-02-24 DIAGNOSIS — S32000A Wedge compression fracture of unspecified lumbar vertebra, initial encounter for closed fracture: Secondary | ICD-10-CM | POA: Diagnosis not present

## 2018-02-24 DIAGNOSIS — S79911A Unspecified injury of right hip, initial encounter: Secondary | ICD-10-CM | POA: Diagnosis not present

## 2018-02-24 DIAGNOSIS — Y939 Activity, unspecified: Secondary | ICD-10-CM | POA: Diagnosis not present

## 2018-02-24 DIAGNOSIS — M25559 Pain in unspecified hip: Secondary | ICD-10-CM | POA: Diagnosis not present

## 2018-02-24 DIAGNOSIS — W19XXXA Unspecified fall, initial encounter: Secondary | ICD-10-CM | POA: Diagnosis not present

## 2018-02-24 DIAGNOSIS — M545 Low back pain: Secondary | ICD-10-CM | POA: Diagnosis not present

## 2018-02-24 DIAGNOSIS — Y929 Unspecified place or not applicable: Secondary | ICD-10-CM | POA: Insufficient documentation

## 2018-02-24 DIAGNOSIS — M4850XA Collapsed vertebra, not elsewhere classified, site unspecified, initial encounter for fracture: Secondary | ICD-10-CM

## 2018-02-24 DIAGNOSIS — S32010A Wedge compression fracture of first lumbar vertebra, initial encounter for closed fracture: Secondary | ICD-10-CM | POA: Diagnosis not present

## 2018-02-24 LAB — BASIC METABOLIC PANEL
ANION GAP: 7 (ref 5–15)
BUN: 18 mg/dL (ref 8–23)
CALCIUM: 8.8 mg/dL — AB (ref 8.9–10.3)
CO2: 27 mmol/L (ref 22–32)
Chloride: 111 mmol/L (ref 98–111)
Creatinine, Ser: 1.22 mg/dL — ABNORMAL HIGH (ref 0.44–1.00)
GFR, EST AFRICAN AMERICAN: 47 mL/min — AB (ref >60)
GFR, EST NON AFRICAN AMERICAN: 40 mL/min — AB (ref >60)
Glucose, Bld: 101 mg/dL — ABNORMAL HIGH (ref 70–99)
POTASSIUM: 3.7 mmol/L (ref 3.5–5.1)
SODIUM: 145 mmol/L (ref 135–145)

## 2018-02-24 LAB — CBC WITH DIFFERENTIAL/PLATELET
BASOS ABS: 0 10*3/uL (ref 0–0.1)
BASOS PCT: 1 %
Eosinophils Absolute: 0.2 10*3/uL (ref 0–0.7)
Eosinophils Relative: 4 %
HEMATOCRIT: 33.4 % — AB (ref 35.0–47.0)
Hemoglobin: 11.3 g/dL — ABNORMAL LOW (ref 12.0–16.0)
LYMPHS PCT: 33 %
Lymphs Abs: 1.4 10*3/uL (ref 1.0–3.6)
MCH: 32.9 pg (ref 26.0–34.0)
MCHC: 33.7 g/dL (ref 32.0–36.0)
MCV: 97.9 fL (ref 80.0–100.0)
MONO ABS: 0.4 10*3/uL (ref 0.2–0.9)
Monocytes Relative: 10 %
NEUTROS ABS: 2.3 10*3/uL (ref 1.4–6.5)
NEUTROS PCT: 52 %
Platelets: 149 10*3/uL — ABNORMAL LOW (ref 150–440)
RBC: 3.41 MIL/uL — ABNORMAL LOW (ref 3.80–5.20)
RDW: 13.9 % (ref 11.5–14.5)
WBC: 4.4 10*3/uL (ref 3.6–11.0)
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
## 2018-02-24 NOTE — ED Notes (Signed)
[facility]  CALLED  FOR  TRANSPORT 

## 2018-02-24 NOTE — ED Notes (Addendum)
Report given to EMS. EMS here to take pt back to [facility]. At this time pt alert, ABCs intact. NAD.

## 2018-02-24 NOTE — Discharge Instructions (Signed)
Use Tylenol for pain.  You can take up to 3 of the regular Tylenols 4 times a day as long as you are eating well.  Please follow-up with your regular doctor.  Please return for worse pain fever vomiting or other problems.

## 2018-02-24 NOTE — ED Notes (Signed)
Pt assisted to and from bathroom. ABCs intact. NAD
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
## 2018-03-06 DIAGNOSIS — I129 Hypertensive chronic kidney disease with stage 1 through stage 4 chronic kidney disease, or unspecified chronic kidney disease: Secondary | ICD-10-CM | POA: Diagnosis not present

## 2018-03-06 DIAGNOSIS — Z9181 History of falling: Secondary | ICD-10-CM | POA: Diagnosis not present

## 2018-03-06 DIAGNOSIS — G301 Alzheimer's disease with late onset: Secondary | ICD-10-CM | POA: Diagnosis not present

## 2018-03-06 DIAGNOSIS — E782 Mixed hyperlipidemia: Secondary | ICD-10-CM | POA: Diagnosis not present

## 2018-03-06 DIAGNOSIS — F0281 Dementia in other diseases classified elsewhere with behavioral disturbance: Secondary | ICD-10-CM | POA: Diagnosis not present

## 2018-03-06 DIAGNOSIS — F419 Anxiety disorder, unspecified: Secondary | ICD-10-CM | POA: Diagnosis not present

## 2018-03-06 DIAGNOSIS — F319 Bipolar disorder, unspecified: Secondary | ICD-10-CM | POA: Diagnosis not present

## 2018-03-06 DIAGNOSIS — N183 Chronic kidney disease, stage 3 (moderate): Secondary | ICD-10-CM | POA: Diagnosis not present

## 2018-03-06 DIAGNOSIS — D631 Anemia in chronic kidney disease: Secondary | ICD-10-CM | POA: Diagnosis not present

## 2018-03-12 DIAGNOSIS — Z9181 History of falling: Secondary | ICD-10-CM | POA: Diagnosis not present

## 2018-03-12 DIAGNOSIS — N183 Chronic kidney disease, stage 3 (moderate): Secondary | ICD-10-CM | POA: Diagnosis not present

## 2018-03-12 DIAGNOSIS — I129 Hypertensive chronic kidney disease with stage 1 through stage 4 chronic kidney disease, or unspecified chronic kidney disease: Secondary | ICD-10-CM | POA: Diagnosis not present

## 2018-03-12 DIAGNOSIS — E782 Mixed hyperlipidemia: Secondary | ICD-10-CM | POA: Diagnosis not present

## 2018-03-12 DIAGNOSIS — D631 Anemia in chronic kidney disease: Secondary | ICD-10-CM | POA: Diagnosis not present

## 2018-03-12 DIAGNOSIS — F319 Bipolar disorder, unspecified: Secondary | ICD-10-CM | POA: Diagnosis not present

## 2018-03-12 DIAGNOSIS — F0281 Dementia in other diseases classified elsewhere with behavioral disturbance: Secondary | ICD-10-CM | POA: Diagnosis not present

## 2018-03-12 DIAGNOSIS — G301 Alzheimer's disease with late onset: Secondary | ICD-10-CM | POA: Diagnosis not present

## 2018-03-12 DIAGNOSIS — F419 Anxiety disorder, unspecified: Secondary | ICD-10-CM | POA: Diagnosis not present

## 2018-03-23 DIAGNOSIS — I10 Essential (primary) hypertension: Secondary | ICD-10-CM | POA: Diagnosis not present

## 2018-03-23 DIAGNOSIS — F0391 Unspecified dementia with behavioral disturbance: Secondary | ICD-10-CM | POA: Diagnosis not present

## 2018-03-23 DIAGNOSIS — N183 Chronic kidney disease, stage 3 (moderate): Secondary | ICD-10-CM | POA: Diagnosis not present

## 2018-03-24 DIAGNOSIS — N183 Chronic kidney disease, stage 3 (moderate): Secondary | ICD-10-CM | POA: Diagnosis not present

## 2018-03-24 DIAGNOSIS — F319 Bipolar disorder, unspecified: Secondary | ICD-10-CM | POA: Diagnosis not present

## 2018-03-24 DIAGNOSIS — G301 Alzheimer's disease with late onset: Secondary | ICD-10-CM | POA: Diagnosis not present

## 2018-03-24 DIAGNOSIS — I129 Hypertensive chronic kidney disease with stage 1 through stage 4 chronic kidney disease, or unspecified chronic kidney disease: Secondary | ICD-10-CM | POA: Diagnosis not present

## 2018-03-24 DIAGNOSIS — D631 Anemia in chronic kidney disease: Secondary | ICD-10-CM | POA: Diagnosis not present

## 2018-03-24 DIAGNOSIS — F419 Anxiety disorder, unspecified: Secondary | ICD-10-CM | POA: Diagnosis not present

## 2018-03-24 DIAGNOSIS — Z9181 History of falling: Secondary | ICD-10-CM | POA: Diagnosis not present

## 2018-03-24 DIAGNOSIS — F0281 Dementia in other diseases classified elsewhere with behavioral disturbance: Secondary | ICD-10-CM | POA: Diagnosis not present

## 2018-03-24 DIAGNOSIS — E782 Mixed hyperlipidemia: Secondary | ICD-10-CM | POA: Diagnosis not present

## 2018-03-26 DIAGNOSIS — I129 Hypertensive chronic kidney disease with stage 1 through stage 4 chronic kidney disease, or unspecified chronic kidney disease: Secondary | ICD-10-CM | POA: Diagnosis not present

## 2018-03-26 DIAGNOSIS — N183 Chronic kidney disease, stage 3 (moderate): Secondary | ICD-10-CM | POA: Diagnosis not present

## 2018-03-26 DIAGNOSIS — F419 Anxiety disorder, unspecified: Secondary | ICD-10-CM | POA: Diagnosis not present

## 2018-03-26 DIAGNOSIS — D631 Anemia in chronic kidney disease: Secondary | ICD-10-CM | POA: Diagnosis not present

## 2018-03-26 DIAGNOSIS — Z9181 History of falling: Secondary | ICD-10-CM | POA: Diagnosis not present

## 2018-03-26 DIAGNOSIS — F0281 Dementia in other diseases classified elsewhere with behavioral disturbance: Secondary | ICD-10-CM | POA: Diagnosis not present

## 2018-03-26 DIAGNOSIS — F319 Bipolar disorder, unspecified: Secondary | ICD-10-CM | POA: Diagnosis not present

## 2018-03-26 DIAGNOSIS — G301 Alzheimer's disease with late onset: Secondary | ICD-10-CM | POA: Diagnosis not present

## 2018-03-26 DIAGNOSIS — E782 Mixed hyperlipidemia: Secondary | ICD-10-CM | POA: Diagnosis not present

## 2018-04-13 DIAGNOSIS — N183 Chronic kidney disease, stage 3 (moderate): Secondary | ICD-10-CM | POA: Diagnosis not present

## 2018-04-13 DIAGNOSIS — I129 Hypertensive chronic kidney disease with stage 1 through stage 4 chronic kidney disease, or unspecified chronic kidney disease: Secondary | ICD-10-CM | POA: Diagnosis not present

## 2018-04-13 DIAGNOSIS — Z9181 History of falling: Secondary | ICD-10-CM | POA: Diagnosis not present

## 2018-04-13 DIAGNOSIS — G301 Alzheimer's disease with late onset: Secondary | ICD-10-CM | POA: Diagnosis not present

## 2018-04-13 DIAGNOSIS — F419 Anxiety disorder, unspecified: Secondary | ICD-10-CM | POA: Diagnosis not present

## 2018-04-13 DIAGNOSIS — F0281 Dementia in other diseases classified elsewhere with behavioral disturbance: Secondary | ICD-10-CM | POA: Diagnosis not present

## 2018-04-13 DIAGNOSIS — F319 Bipolar disorder, unspecified: Secondary | ICD-10-CM | POA: Diagnosis not present

## 2018-04-13 DIAGNOSIS — D631 Anemia in chronic kidney disease: Secondary | ICD-10-CM | POA: Diagnosis not present

## 2018-04-14 ENCOUNTER — Other Ambulatory Visit: Payer: Self-pay

## 2018-04-14 ENCOUNTER — Ambulatory Visit (INDEPENDENT_AMBULATORY_CARE_PROVIDER_SITE_OTHER): Payer: Medicare HMO | Admitting: Psychiatry

## 2018-04-14 ENCOUNTER — Encounter: Payer: Self-pay | Admitting: Psychiatry

## 2018-04-14 VITALS — BP 171/75 | HR 69 | Temp 99.0°F | Wt 138.8 lb

## 2018-04-14 DIAGNOSIS — E785 Hyperlipidemia, unspecified: Secondary | ICD-10-CM | POA: Insufficient documentation

## 2018-04-14 DIAGNOSIS — N183 Chronic kidney disease, stage 3 unspecified: Secondary | ICD-10-CM | POA: Insufficient documentation

## 2018-04-14 DIAGNOSIS — F0281 Dementia in other diseases classified elsewhere with behavioral disturbance: Secondary | ICD-10-CM

## 2018-04-14 DIAGNOSIS — G4700 Insomnia, unspecified: Secondary | ICD-10-CM

## 2018-04-14 DIAGNOSIS — N189 Chronic kidney disease, unspecified: Secondary | ICD-10-CM | POA: Insufficient documentation

## 2018-04-14 DIAGNOSIS — G309 Alzheimer's disease, unspecified: Secondary | ICD-10-CM

## 2018-04-14 DIAGNOSIS — I10 Essential (primary) hypertension: Secondary | ICD-10-CM | POA: Insufficient documentation

## 2018-04-14 DIAGNOSIS — R413 Other amnesia: Secondary | ICD-10-CM | POA: Insufficient documentation

## 2018-04-14 DIAGNOSIS — F419 Anxiety disorder, unspecified: Secondary | ICD-10-CM | POA: Insufficient documentation

## 2018-04-14 MED ORDER — RISPERIDONE 0.5 MG PO TABS: 0.5000 mg | ORAL_TABLET | ORAL | 0 refills | Status: DC

## 2018-04-14 MED ORDER — MIRTAZAPINE 7.5 MG PO TABS: 7.5000 mg | ORAL_TABLET | Freq: Every day | ORAL | 0 refills | Status: DC
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
## 2018-04-14 NOTE — Patient Instructions (Signed)
Stop Seroquel. Start Risperidone as prescribed. Start Remeron 7.5 mg po qhs for sleep.

## 2018-04-15 ENCOUNTER — Encounter: Payer: Self-pay | Admitting: Psychiatry

## 2018-04-17 DIAGNOSIS — I10 Essential (primary) hypertension: Secondary | ICD-10-CM | POA: Diagnosis not present

## 2018-04-18 DIAGNOSIS — I10 Essential (primary) hypertension: Secondary | ICD-10-CM | POA: Diagnosis not present

## 2018-04-19 DIAGNOSIS — I10 Essential (primary) hypertension: Secondary | ICD-10-CM | POA: Diagnosis not present

## 2018-04-20 DIAGNOSIS — I10 Essential (primary) hypertension: Secondary | ICD-10-CM | POA: Diagnosis not present

## 2018-04-21 DIAGNOSIS — I10 Essential (primary) hypertension: Secondary | ICD-10-CM | POA: Diagnosis not present

## 2018-04-22 DIAGNOSIS — I10 Essential (primary) hypertension: Secondary | ICD-10-CM | POA: Diagnosis not present

## 2018-04-23 DIAGNOSIS — I10 Essential (primary) hypertension: Secondary | ICD-10-CM | POA: Diagnosis not present

## 2018-04-24 DIAGNOSIS — I10 Essential (primary) hypertension: Secondary | ICD-10-CM | POA: Diagnosis not present

## 2018-04-25 DIAGNOSIS — I10 Essential (primary) hypertension: Secondary | ICD-10-CM | POA: Diagnosis not present

## 2018-04-26 DIAGNOSIS — I10 Essential (primary) hypertension: Secondary | ICD-10-CM | POA: Diagnosis not present

## 2018-04-27 DIAGNOSIS — I10 Essential (primary) hypertension: Secondary | ICD-10-CM | POA: Diagnosis not present

## 2018-04-28 ENCOUNTER — Other Ambulatory Visit: Payer: Self-pay

## 2018-04-28 ENCOUNTER — Encounter: Payer: Self-pay | Admitting: Psychiatry

## 2018-04-28 ENCOUNTER — Ambulatory Visit (INDEPENDENT_AMBULATORY_CARE_PROVIDER_SITE_OTHER): Payer: Medicare HMO | Admitting: Psychiatry

## 2018-04-28 VITALS — BP 207/68 | HR 60 | Temp 97.9°F | Wt 136.6 lb

## 2018-04-28 DIAGNOSIS — F02818 Dementia in other diseases classified elsewhere, unspecified severity, with other behavioral disturbance: Secondary | ICD-10-CM

## 2018-04-28 DIAGNOSIS — F0281 Dementia in other diseases classified elsewhere with behavioral disturbance: Secondary | ICD-10-CM | POA: Diagnosis not present

## 2018-04-28 DIAGNOSIS — G309 Alzheimer's disease, unspecified: Secondary | ICD-10-CM

## 2018-04-28 DIAGNOSIS — G4700 Insomnia, unspecified: Secondary | ICD-10-CM | POA: Diagnosis not present

## 2018-04-28 DIAGNOSIS — R030 Elevated blood-pressure reading, without diagnosis of hypertension: Secondary | ICD-10-CM

## 2018-04-28 DIAGNOSIS — I10 Essential (primary) hypertension: Secondary | ICD-10-CM | POA: Diagnosis not present

## 2018-04-28 MED ORDER — RISPERIDONE 0.5 MG PO TABS: 0.5000 mg | ORAL_TABLET | ORAL | 1 refills | Status: DC

## 2018-04-28 MED ORDER — RISPERIDONE 0.5 MG PO TABS: 0.5000 mg | ORAL_TABLET | Freq: Two times a day (BID) | ORAL | 0 refills | Status: DC

## 2018-04-28 MED ORDER — LORAZEPAM 0.5 MG PO TABS: 0.5000 mg | ORAL_TABLET | Freq: Two times a day (BID) | ORAL | 3 refills | Status: DC | PRN

## 2018-04-28 MED ORDER — MIRTAZAPINE 7.5 MG PO TABS: 7.5000 mg | ORAL_TABLET | Freq: Every day | ORAL | 3 refills | Status: DC

## 2018-04-28 NOTE — Patient Instructions (Signed)
PLEASE STOP QUETIAPINE. THIS WAS DISCONTINUED LAST VISIT.  PLEASE CONTINUE RISPERIDONE TWICE A DAY AND ALSO ADD RISPERIDONE 0.5 MG PRN FOR AGITATION.  CONTINUE OTHER MEDICATIONS - NO CHANGES.

## 2018-04-28 NOTE — Progress Notes (Signed)
[facility] MD OP Progress Note  04/28/2018 2:04 PM Leah Waller  MRN:  [id number]  Chief Complaint:  ' I am here for follow up.' Chief Complaint    Follow-up; Medication Refill     HPI: Leah Waller is an 83 yr old African-American female, divorced, retired, lives in [place], has a history of Alzheimer's disease with behavioral disturbances, sleep problems, vitamin B12 deficiency, hypertension, hyperlipidemia, presented to the clinic today for a follow-up visit.  Patient presented along with her daughter Leah Waller - her legal guardian.  Patient continues to have significant memory problems.  She continues to be alert to her self as well as situation.  She was able to tell me the name of her daughter as Leah Waller.  Patient attempted to answer questions but due to her significant cognitive problems is a poor historian.  Patient however appears to be cooperative, pleasant this morning.  Her daughter provided majority of the information.  Per daughter she continues to have some wandering behavior and behavioral  problems.  She however has started sleeping better on the mirtazapine.  Patient is tolerating the risperidone well.  Denies any adverse effects like tremors, stiffness and so on.  However per review of her medication list today she continues to be on Seroquel which was discontinued last visit.  This was discussed with daughter as well as gave written instructions to the facility to take it off the list.  Also she is not on the risperidone as needed,which was given to her last visit.  This was also added as a  written instruction.   Visit Diagnosis:    ICD-10-CM   1. Major neurocognitive disorder due to Alzheimer's disease, with behavioral disturbance (HCC) G30.9 LORazepam (ATIVAN) 0.5 MG tablet   F02.81   2. Insomnia, unspecified type G47.00   3. Elevated blood pressure reading R03.0     Past Psychiatric History: Have reviewed past psychiatric history from my progress note on 04/14/2018.   Past trials of Seroquel, Ativan.  Past Medical History:  Past Medical History:  Diagnosis Date  . Anxiety   . B12 deficiency   . Bipolar disorder ([place])   . Dementia ([place])   . Hypertension    History reviewed. No pertinent surgical history.  Family Psychiatric History: I have reviewed family psychiatric history from my progress note on 04/14/2018.  Family History:  Family History  Family history unknown: Yes    Social History: I have reviewed social history from my progress note on 04/14/2018. Social History   Socioeconomic History  . Marital status: Divorced    Spouse name: Not on file  . Number of children: 4  . Years of education: Not on file  . Highest education level: GED or equivalent  Occupational History    Comment: retired  Scientific laboratory technician  . Financial resource strain: Not hard at all  . Food insecurity:    Worry: Never true    Inability: Never true  . Transportation needs:    Medical: No    Non-medical: No  Tobacco Use  . Smoking status: Former Research scientist (life sciences)  . Smokeless tobacco: Never Used  Substance and Sexual Activity  . Alcohol use: Not Currently  . Drug use: Not Currently  . Sexual activity: Not Currently  Lifestyle  . Physical activity:    Days per week: 0 days    Minutes per session: 0 min  . Stress: Not on file  Relationships  . Social connections:    Talks on phone: Not on file  Gets together: Not on file    Attends religious service: More than 4 times per year    Active member of club or organization: No    Attends meetings of clubs or organizations: Never    Relationship status: Not on file  Other Topics Concern  . Not on file  Social History Narrative  . Not on file    Allergies:  Allergies  Allergen Reactions  . Aspirin Other (See Comments)    Unspecified  . Lisinopril Other (See Comments)    Metabolic Disorder Labs: No results found for: HGBA1C, MPG No results found for: PROLACTIN No results found for: CHOL, TRIG, HDL,  CHOLHDL, VLDL, LDLCALC No results found for: TSH  Therapeutic Level Labs: No results found for: LITHIUM No results found for: VALPROATE No components found for:  CBMZ  Current Medications: Current Outpatient Medications  Medication Sig Dispense Refill  . atorvastatin (LIPITOR) 10 MG tablet Take 10 mg by mouth daily.    . cyanocobalamin ([employer] VITAMIN B12) 1000 MCG tablet Take 1 tablet by mouth daily    . donepezil (ARICEPT) 5 MG tablet Take 5 mg by mouth at bedtime.    . ferrous sulfate 325 (65 FE) MG tablet Take 325 mg by mouth daily with breakfast.    . LORazepam (ATIVAN) 0.5 MG tablet Take 1 tablet (0.5 mg total) by mouth 2 (two) times daily as needed for anxiety. 60 tablet 3  . losartan (COZAAR) 50 MG tablet Take 50 mg by mouth daily.    . mirtazapine (REMERON) 7.5 MG tablet Take 1 tablet (7.5 mg total) by mouth at bedtime. For sleep 30 tablet 3  . risperiDONE (RISPERDAL) 0.5 MG tablet Take 1 tablet (0.5 mg total) by mouth 2 (two) times daily. 90 tablet 0  . vitamin B-12 (CYANOCOBALAMIN) 500 MCG tablet Take 1,000 mcg by mouth daily.    . risperiDONE (RISPERDAL) 0.5 MG tablet Take 1 tablet (0.5 mg total) by mouth as directed. Daily once as needed for agitation 30 tablet 1   No current facility-administered medications for this visit.      Musculoskeletal: Strength & Muscle Tone: within normal limits Gait & Station: normal Patient leans: N/A  Psychiatric Specialty Exam: Review of Systems  Unable to perform ROS: Dementia    Blood pressure (!) 207/68, pulse 60, temperature 97.9 F (36.6 C), temperature source Oral, weight 136 lb 9.6 oz (62 kg).Body mass index is 24.98 kg/m.  General Appearance: Casual  Eye Contact:  Fair  Speech:  Normal Rate  Volume:  Normal  Mood:  Euthymic  Affect:  Congruent  Thought Process:  Goal Directed and Descriptions of Associations: Intact  Orientation:  Other:  to self , situation  Thought Content: Logical   Suicidal Thoughts:  did not  express any  Homicidal Thoughts:  did not express any  Memory:  Immediate;   limited Recent;   limited Remote;   limited  Judgement:  Poor  Insight:  Shallow  Psychomotor Activity:  Normal  Concentration:  Concentration: Fair and Attention Span: Fair  Recall:  Poor  Fund of Knowledge: Poor  Language: Fair  Akathisia:  No  Handed:  Right  AIMS (if indicated):denies rigidity,stiffness, tremors  Assets:  Housing Social Support  ADL's:  Intact  Cognition: Impaired,  Severe  Sleep:  improving   Screenings:   Assessment and Plan: Concetta is an 83 yr old African-American female, divorced, has a history of dementia with behavioral problems, insomnia, vitamin B12 deficiency, hyperlipidemia, hypertension, presented to  the clinic today for a follow-up visit.  Patient presented along with her legal guardian-daughter-[name] who provided majority of the information.  Patient continues to have some behavioral problems likely due to her dementia.  Per daughter sleep is improved.  We will continue medications as noted below.  Plan Dementia with behavioral problems Continue Aricept as prescribed by Dr. Ginette Pitman. Discussed with patient as well as daughter that Seroquel needs to be discontinued. Continue risperidone 0.5 mg p.o. twice daily. Discussed again to start risperidone 0.5 mg daily as needed for severe agitation Continue Ativan 0.5 mg p.o. twice daily as needed for agitation.  I have reviewed [place] controlled substance database.  For insomnia Remeron 7.5 mg p.o. nightly  Discussed with daughter regarding referral to neurology.  She reports she will make an appointment.  Elevated blood pressure reading Patient to follow-up with primary medical doctor.  Follow-up in clinic in 1 month or sooner if needed.  More than 50 % of the time was spent for psychoeducation and supportive psychotherapy and care coordination.  This note was generated in part or whole with voice recognition software. Voice  recognition is usually quite accurate but there are transcription errors that can and very often do occur. I apologize for any typographical errors that were not detected and corrected.       Ursula Alert, MD 04/28/2018, 2:04 PM

## 2018-04-29 DIAGNOSIS — I10 Essential (primary) hypertension: Secondary | ICD-10-CM | POA: Diagnosis not present

## 2018-04-30 DIAGNOSIS — I10 Essential (primary) hypertension: Secondary | ICD-10-CM | POA: Diagnosis not present

## 2018-05-01 DIAGNOSIS — I10 Essential (primary) hypertension: Secondary | ICD-10-CM | POA: Diagnosis not present

## 2018-05-02 DIAGNOSIS — I10 Essential (primary) hypertension: Secondary | ICD-10-CM | POA: Diagnosis not present

## 2018-05-03 DIAGNOSIS — I10 Essential (primary) hypertension: Secondary | ICD-10-CM | POA: Diagnosis not present

## 2018-05-04 DIAGNOSIS — I10 Essential (primary) hypertension: Secondary | ICD-10-CM | POA: Diagnosis not present

## 2018-05-05 DIAGNOSIS — I10 Essential (primary) hypertension: Secondary | ICD-10-CM | POA: Diagnosis not present

## 2018-05-06 DIAGNOSIS — I10 Essential (primary) hypertension: Secondary | ICD-10-CM | POA: Diagnosis not present

## 2018-05-07 DIAGNOSIS — I10 Essential (primary) hypertension: Secondary | ICD-10-CM | POA: Diagnosis not present

## 2018-05-08 DIAGNOSIS — I10 Essential (primary) hypertension: Secondary | ICD-10-CM | POA: Diagnosis not present

## 2018-05-09 DIAGNOSIS — I10 Essential (primary) hypertension: Secondary | ICD-10-CM | POA: Diagnosis not present

## 2018-05-10 DIAGNOSIS — I10 Essential (primary) hypertension: Secondary | ICD-10-CM | POA: Diagnosis not present

## 2018-05-11 DIAGNOSIS — I10 Essential (primary) hypertension: Secondary | ICD-10-CM | POA: Diagnosis not present

## 2018-05-12 DIAGNOSIS — I10 Essential (primary) hypertension: Secondary | ICD-10-CM | POA: Diagnosis not present

## 2018-05-13 DIAGNOSIS — I10 Essential (primary) hypertension: Secondary | ICD-10-CM | POA: Diagnosis not present

## 2018-05-14 DIAGNOSIS — I10 Essential (primary) hypertension: Secondary | ICD-10-CM | POA: Diagnosis not present

## 2018-05-15 DIAGNOSIS — I10 Essential (primary) hypertension: Secondary | ICD-10-CM | POA: Diagnosis not present

## 2018-05-16 DIAGNOSIS — I10 Essential (primary) hypertension: Secondary | ICD-10-CM | POA: Diagnosis not present

## 2018-05-17 DIAGNOSIS — I10 Essential (primary) hypertension: Secondary | ICD-10-CM | POA: Diagnosis not present

## 2018-05-18 DIAGNOSIS — I10 Essential (primary) hypertension: Secondary | ICD-10-CM | POA: Diagnosis not present

## 2018-05-19 DIAGNOSIS — I10 Essential (primary) hypertension: Secondary | ICD-10-CM | POA: Diagnosis not present

## 2018-05-20 DIAGNOSIS — I10 Essential (primary) hypertension: Secondary | ICD-10-CM | POA: Diagnosis not present

## 2018-05-21 DIAGNOSIS — I10 Essential (primary) hypertension: Secondary | ICD-10-CM | POA: Diagnosis not present

## 2018-05-22 DIAGNOSIS — I10 Essential (primary) hypertension: Secondary | ICD-10-CM | POA: Diagnosis not present

## 2018-05-23 DIAGNOSIS — I10 Essential (primary) hypertension: Secondary | ICD-10-CM | POA: Diagnosis not present

## 2018-05-24 DIAGNOSIS — I10 Essential (primary) hypertension: Secondary | ICD-10-CM | POA: Diagnosis not present

## 2018-05-25 DIAGNOSIS — I10 Essential (primary) hypertension: Secondary | ICD-10-CM | POA: Diagnosis not present

## 2018-05-26 DIAGNOSIS — I10 Essential (primary) hypertension: Secondary | ICD-10-CM | POA: Diagnosis not present

## 2018-05-27 DIAGNOSIS — I10 Essential (primary) hypertension: Secondary | ICD-10-CM | POA: Diagnosis not present

## 2018-05-28 DIAGNOSIS — I10 Essential (primary) hypertension: Secondary | ICD-10-CM | POA: Diagnosis not present

## 2018-05-29 DIAGNOSIS — I10 Essential (primary) hypertension: Secondary | ICD-10-CM | POA: Diagnosis not present

## 2018-05-30 DIAGNOSIS — I10 Essential (primary) hypertension: Secondary | ICD-10-CM | POA: Diagnosis not present

## 2018-05-31 DIAGNOSIS — I10 Essential (primary) hypertension: Secondary | ICD-10-CM | POA: Diagnosis not present

## 2018-06-01 ENCOUNTER — Other Ambulatory Visit: Payer: Self-pay

## 2018-06-01 ENCOUNTER — Encounter: Payer: Self-pay | Admitting: Psychiatry

## 2018-06-01 ENCOUNTER — Ambulatory Visit (INDEPENDENT_AMBULATORY_CARE_PROVIDER_SITE_OTHER): Payer: Medicare HMO | Admitting: Psychiatry

## 2018-06-01 VITALS — BP 181/67 | HR 71 | Temp 99.1°F | Wt 137.2 lb

## 2018-06-01 DIAGNOSIS — G309 Alzheimer's disease, unspecified: Secondary | ICD-10-CM

## 2018-06-01 DIAGNOSIS — F0281 Dementia in other diseases classified elsewhere with behavioral disturbance: Secondary | ICD-10-CM | POA: Diagnosis not present

## 2018-06-01 DIAGNOSIS — G4700 Insomnia, unspecified: Secondary | ICD-10-CM | POA: Diagnosis not present

## 2018-06-01 MED ORDER — RISPERIDONE 0.5 MG PO TABS: 0.5000 mg | ORAL_TABLET | ORAL | 2 refills | Status: DC

## 2018-06-01 MED ORDER — RISPERIDONE 0.5 MG PO TABS: 0.5000 mg | ORAL_TABLET | ORAL | 1 refills | Status: DC

## 2018-06-01 NOTE — Progress Notes (Signed)
[facility] MD OP Progress Note  06/01/2018 3:44 PM Leah Waller  MRN:  [id number]  Chief Complaint: ' I am here for follow up.' Chief Complaint    Follow-up; Medication Refill     HPI: Leah Waller is an 83 year old African-American female, divorced, retired, lives in [place], has a history of Alzheimer's disease with behavioral disturbances, sleep problems, vitamin B12 deficiency, hypertension, hyperlipidemia, presented to the clinic today for a follow-up visit.  Patient presented along with her daughter [name]-her legal guardian.  Patient's daughter initially came in prior to the patient and provided collateral information.  Per her patient is making some progress but continues to have some behavioral problems and agitation.  She is tolerating the medications well with no side effects per report.  Patient today appeared to be alert, oriented to person, place as well as situation.  She is a poor historian however was able to answer questions appropriately in short phrases.  Patient seems to be tolerating medications well.  Her aims scale today is 0.  Patient has upcoming appointment with neurology for her dementia.  Since patient continues to have some agitation per daughter ,will increase her risperidone slightly.  It was discussed with patient and she agrees with plan.  Patient continues to have elevated blood pressure, will continue to follow-up with her PMD for the same Visit Diagnosis:    ICD-10-CM   1. Major neurocognitive disorder due to Alzheimer's disease, with behavioral disturbance ([place]) G30.9    F02.81   2. Insomnia, unspecified type G47.00     Past Psychiatric History: I have reviewed past psychiatric history from my progress note on 04/14/2018  Past Medical History:  Past Medical History:  Diagnosis Date  . Anxiety   . B12 deficiency   . Bipolar disorder ([place])   . Dementia ([place])   . Hypertension    History reviewed. No pertinent surgical history.  Family  Psychiatric History: I have reviewed family psychiatric history from my progress note on 04/14/2018.  Past trials of Seroquel, Ativan  Family History:  Family History  Family history unknown: Yes    Social History: Have reviewed social history from my progress note on 04/14/2018 Social History   Socioeconomic History  . Marital status: Divorced    Spouse name: Not on file  . Number of children: 4  . Years of education: Not on file  . Highest education level: GED or equivalent  Occupational History    Comment: retired  Scientific laboratory technician  . Financial resource strain: Not hard at all  . Food insecurity:    Worry: Never true    Inability: Never true  . Transportation needs:    Medical: No    Non-medical: No  Tobacco Use  . Smoking status: Former Research scientist (life sciences)  . Smokeless tobacco: Never Used  Substance and Sexual Activity  . Alcohol use: Not Currently  . Drug use: Not Currently  . Sexual activity: Not Currently  Lifestyle  . Physical activity:    Days per week: 0 days    Minutes per session: 0 min  . Stress: Not on file  Relationships  . Social connections:    Talks on phone: Not on file    Gets together: Not on file    Attends religious service: More than 4 times per year    Active member of club or organization: No    Attends meetings of clubs or organizations: Never    Relationship status: Not on file  Other Topics Concern  . Not  on file  Social History Narrative  . Not on file    Allergies:  Allergies  Allergen Reactions  . Aspirin Other (See Comments)    Unspecified  . Lisinopril Other (See Comments)    Metabolic Disorder Labs: No results found for: HGBA1C, MPG No results found for: PROLACTIN No results found for: CHOL, TRIG, HDL, CHOLHDL, VLDL, LDLCALC No results found for: TSH  Therapeutic Level Labs: No results found for: LITHIUM No results found for: VALPROATE No components found for:  CBMZ  Current Medications: Current Outpatient Medications   Medication Sig Dispense Refill  . atorvastatin (LIPITOR) 10 MG tablet Take 10 mg by mouth daily.    . cyanocobalamin ([employer]S VITAMIN B12) 1000 MCG tablet Take 1 tablet by mouth daily    . donepezil (ARICEPT) 5 MG tablet Take 5 mg by mouth at bedtime.    . ferrous sulfate 325 (65 FE) MG tablet Take 325 mg by mouth daily with breakfast.    . LORazepam (ATIVAN) 0.5 MG tablet Take 1 tablet (0.5 mg total) by mouth 2 (two) times daily as needed for anxiety. 60 tablet 3  . losartan (COZAAR) 50 MG tablet Take 50 mg by mouth daily.    . mirtazapine (REMERON) 7.5 MG tablet Take 1 tablet (7.5 mg total) by mouth at bedtime. For sleep 30 tablet 3  . risperiDONE (RISPERDAL) 0.5 MG tablet Take 1-1.5 tablets (0.5-0.75 mg total) by mouth as directed. Take 1 tablet in the AM and 1.5 tablets PM 75 tablet 2  . risperiDONE (RISPERDAL) 0.5 MG tablet Take 1 tablet (0.5 mg total) by mouth as directed. Daily once as needed for agitation 30 tablet 1  . vitamin B-12 (CYANOCOBALAMIN) 500 MCG tablet Take 1,000 mcg by mouth daily.     No current facility-administered medications for this visit.      Musculoskeletal: Strength & Muscle Tone: within normal limits Gait & Station: normal Patient leans: N/A  Psychiatric Specialty Exam: Review of Systems  Psychiatric/Behavioral: The patient is nervous/anxious and has insomnia.   All other systems reviewed and are negative.   Blood pressure (!) 181/67, pulse 71, temperature 99.1 F (37.3 C), temperature source Oral, weight 137 lb 3.2 oz (62.2 kg).Body mass index is 25.09 kg/m.  General Appearance: Casual  Eye Contact:  Fair  Speech:  Clear and Coherent  Volume:  Normal  Mood:  Euthymic  Affect:  Congruent  Thought Process:  Goal Directed and Descriptions of Associations: Intact  Orientation:  Full (Time, Place, and Person)  Thought Content: Logical   Suicidal Thoughts:  No  Homicidal Thoughts:  No  Memory:  Immediate;   Fair Recent;   Fair Remote;   Fair   Judgement:  Fair  Insight:  Fair  Psychomotor Activity:  Normal  Concentration:  Concentration: Fair and Attention Span: Fair  Recall:  [employer] of Knowledge: Fair  Language: Fair  Akathisia:  No  Handed:  Right  AIMS (if indicated): 0  Assets:  Communication Skills Desire for Improvement Social Support  ADL's:  Intact  Cognition: Impaired,  Severe  Sleep:  Improving   Screenings:   Assessment and Plan: Hennessey is an 83 year old African-American female, divorced, has a history of dementia with behavioral problems, insomnia, vitamin B12 deficiency, hyperlipidemia, hypertension, presented to the clinic today for a follow-up visit.  Patient presented along with her legal guardian, daughter-[name], who provided majority of information.  Patient per daughter is making some progress however continues to have some agitation.  We will  continue to make medication changes as noted below.  Plan Dementia with behavioral problems Continue Aricept as prescribed by Dr. Ginette Pitman. Increase risperidone to 0.5 mg p.o. daily in the a.m. and 0.75 mg p.o. every afternoon. Continue risperidone 0.5 mg daily as needed for severe agitation. Continue Ativan 0.5 mg p.o. twice daily as needed for agitation.  Insomnia Mirtazapine 7.5 mg p.o. nightly.  Patient has upcoming appointment with neurology for dementia.  Elevated blood pressure reading Patient to follow-up with PMD.  Follow-up in clinic in 6 weeks or sooner if needed.  More than 50 % of the time was spent for psychoeducation and supportive psychotherapy and care coordination.  This note was generated in part or whole with voice recognition software. Voice recognition is usually quite accurate but there are transcription errors that can and very often do occur. I apologize for any typographical errors that were not detected and corrected.        Ursula Alert, MD 06/01/2018, 3:44 PM

## 2018-06-08 DIAGNOSIS — I10 Essential (primary) hypertension: Secondary | ICD-10-CM | POA: Diagnosis not present

## 2018-06-09 DIAGNOSIS — I10 Essential (primary) hypertension: Secondary | ICD-10-CM | POA: Diagnosis not present

## 2018-06-10 DIAGNOSIS — I10 Essential (primary) hypertension: Secondary | ICD-10-CM | POA: Diagnosis not present

## 2018-06-11 DIAGNOSIS — I10 Essential (primary) hypertension: Secondary | ICD-10-CM | POA: Diagnosis not present

## 2018-06-12 DIAGNOSIS — I10 Essential (primary) hypertension: Secondary | ICD-10-CM | POA: Diagnosis not present

## 2018-06-13 DIAGNOSIS — I10 Essential (primary) hypertension: Secondary | ICD-10-CM | POA: Diagnosis not present

## 2018-06-14 DIAGNOSIS — I10 Essential (primary) hypertension: Secondary | ICD-10-CM | POA: Diagnosis not present

## 2018-06-15 DIAGNOSIS — I10 Essential (primary) hypertension: Secondary | ICD-10-CM | POA: Diagnosis not present

## 2018-06-16 DIAGNOSIS — I10 Essential (primary) hypertension: Secondary | ICD-10-CM | POA: Diagnosis not present

## 2018-06-17 DIAGNOSIS — I10 Essential (primary) hypertension: Secondary | ICD-10-CM | POA: Diagnosis not present

## 2018-06-18 DIAGNOSIS — I10 Essential (primary) hypertension: Secondary | ICD-10-CM | POA: Diagnosis not present

## 2018-06-19 DIAGNOSIS — I10 Essential (primary) hypertension: Secondary | ICD-10-CM | POA: Diagnosis not present

## 2018-06-20 DIAGNOSIS — I10 Essential (primary) hypertension: Secondary | ICD-10-CM | POA: Diagnosis not present

## 2018-06-21 DIAGNOSIS — I10 Essential (primary) hypertension: Secondary | ICD-10-CM | POA: Diagnosis not present

## 2018-06-22 DIAGNOSIS — I10 Essential (primary) hypertension: Secondary | ICD-10-CM | POA: Diagnosis not present

## 2018-06-23 DIAGNOSIS — I10 Essential (primary) hypertension: Secondary | ICD-10-CM | POA: Diagnosis not present

## 2018-06-24 DIAGNOSIS — I10 Essential (primary) hypertension: Secondary | ICD-10-CM | POA: Diagnosis not present

## 2018-06-25 DIAGNOSIS — I10 Essential (primary) hypertension: Secondary | ICD-10-CM | POA: Diagnosis not present

## 2018-06-26 DIAGNOSIS — I10 Essential (primary) hypertension: Secondary | ICD-10-CM | POA: Diagnosis not present

## 2018-06-27 DIAGNOSIS — I10 Essential (primary) hypertension: Secondary | ICD-10-CM | POA: Diagnosis not present

## 2018-06-28 DIAGNOSIS — I10 Essential (primary) hypertension: Secondary | ICD-10-CM | POA: Diagnosis not present

## 2018-06-29 DIAGNOSIS — I10 Essential (primary) hypertension: Secondary | ICD-10-CM | POA: Diagnosis not present

## 2018-06-30 DIAGNOSIS — I10 Essential (primary) hypertension: Secondary | ICD-10-CM | POA: Diagnosis not present

## 2018-07-01 DIAGNOSIS — I10 Essential (primary) hypertension: Secondary | ICD-10-CM | POA: Diagnosis not present

## 2018-07-02 DIAGNOSIS — I10 Essential (primary) hypertension: Secondary | ICD-10-CM | POA: Diagnosis not present

## 2018-07-03 DIAGNOSIS — I10 Essential (primary) hypertension: Secondary | ICD-10-CM | POA: Diagnosis not present

## 2018-07-04 DIAGNOSIS — I10 Essential (primary) hypertension: Secondary | ICD-10-CM | POA: Diagnosis not present

## 2018-07-05 DIAGNOSIS — I10 Essential (primary) hypertension: Secondary | ICD-10-CM | POA: Diagnosis not present

## 2018-07-06 DIAGNOSIS — I10 Essential (primary) hypertension: Secondary | ICD-10-CM | POA: Diagnosis not present

## 2018-07-07 DIAGNOSIS — I10 Essential (primary) hypertension: Secondary | ICD-10-CM | POA: Diagnosis not present

## 2018-07-08 DIAGNOSIS — I10 Essential (primary) hypertension: Secondary | ICD-10-CM | POA: Diagnosis not present

## 2018-07-09 DIAGNOSIS — I10 Essential (primary) hypertension: Secondary | ICD-10-CM | POA: Diagnosis not present

## 2018-07-10 DIAGNOSIS — I10 Essential (primary) hypertension: Secondary | ICD-10-CM | POA: Diagnosis not present

## 2018-07-11 DIAGNOSIS — I10 Essential (primary) hypertension: Secondary | ICD-10-CM | POA: Diagnosis not present

## 2018-07-12 DIAGNOSIS — I10 Essential (primary) hypertension: Secondary | ICD-10-CM | POA: Diagnosis not present

## 2018-07-13 ENCOUNTER — Other Ambulatory Visit: Payer: Self-pay

## 2018-07-13 ENCOUNTER — Encounter: Payer: Self-pay | Admitting: Psychiatry

## 2018-07-13 ENCOUNTER — Ambulatory Visit (INDEPENDENT_AMBULATORY_CARE_PROVIDER_SITE_OTHER): Payer: Medicare HMO | Admitting: Psychiatry

## 2018-07-13 VITALS — BP 160/80 | HR 64 | Temp 99.0°F | Wt 137.4 lb

## 2018-07-13 DIAGNOSIS — F0281 Dementia in other diseases classified elsewhere with behavioral disturbance: Secondary | ICD-10-CM | POA: Diagnosis not present

## 2018-07-13 DIAGNOSIS — G4700 Insomnia, unspecified: Secondary | ICD-10-CM | POA: Diagnosis not present

## 2018-07-13 DIAGNOSIS — R030 Elevated blood-pressure reading, without diagnosis of hypertension: Secondary | ICD-10-CM | POA: Diagnosis not present

## 2018-07-13 DIAGNOSIS — G309 Alzheimer's disease, unspecified: Secondary | ICD-10-CM

## 2018-07-13 DIAGNOSIS — I10 Essential (primary) hypertension: Secondary | ICD-10-CM | POA: Diagnosis not present

## 2018-07-13 MED ORDER — RISPERIDONE 0.5 MG PO TABS: 0.5000 mg | ORAL_TABLET | ORAL | 0 refills | Status: DC

## 2018-07-13 MED ORDER — MIRTAZAPINE 7.5 MG PO TABS: 7.5000 mg | ORAL_TABLET | Freq: Every day | ORAL | 0 refills | Status: DC

## 2018-07-13 NOTE — Progress Notes (Signed)
[facility] MD OP Progress Note  07/13/2018 5:25 PM Leah Waller  MRN:  [id number]  Chief Complaint: ' I am here for follow up.' Chief Complaint    Follow-up; Medication Refill     HPI: Leah Waller is a 84 year old African-American female, divorced, retired, lives in [place], has a history of Alzheimer's disease with behavioral disturbances, sleep problems, vitamin B12 deficiency, hypertension, hyperlipidemia, presented to the clinic today for a follow-up visit.  Patient is today here with her daughter Leah Waller-her legal guardian.  Patient's daughter provided collateral information.  Per daughter patient is tolerating the medications well.  She has not had any significant agitation since being on the higher dosage of risperidone.  She is also sleeping better.  Patient is a limited historian.  Patient was able to answer simple questions asked.  She seems to be alert and is oriented to person place as well as situation.  She was able to tell me the month and the year.  Patient denies any perceptual disturbances.  Patient denies any suicidality or homicidality.  Patient reports appetite is fair.  Patient had an appointment with neurologist pending consult report.  Discussed with patient as well as daughter that we need to request medical records from her neurologist.  Patient's daughter will sign a consent to obtain medical records from her most recent visit with neurology. Visit Diagnosis:    ICD-10-CM   1. Major neurocognitive disorder due to Alzheimer's disease, with behavioral disturbance (HCC) G30.9 mirtazapine (REMERON) 7.5 MG tablet   F02.81 risperiDONE (RISPERDAL) 0.5 MG tablet    risperiDONE (RISPERDAL) 0.5 MG tablet  2. Insomnia, unspecified type G47.00    improved  3. Elevated blood pressure reading R03.0     Past Psychiatric History: I have reviewed past psychiatric history from my progress note on 04/14/2018.  Past Medical History:  Past Medical History:  Diagnosis Date  .  Anxiety   . B12 deficiency   . Bipolar disorder ([place])   . Dementia ([place])   . Hypertension    History reviewed. No pertinent surgical history.  Family Psychiatric History: I have reviewed family psychiatric history from my progress note on 04/14/2018.  Past trials of Seroquel, Ativan.  Family History:  Family History  Family history unknown: Yes    Social History: I have reviewed social history from my progress note on 04/14/2018. Social History   Socioeconomic History  . Marital status: Divorced    Spouse name: Not on file  . Number of children: 4  . Years of education: Not on file  . Highest education level: GED or equivalent  Occupational History    Comment: retired  Scientific laboratory technician  . Financial resource strain: Not hard at all  . Food insecurity:    Worry: Never true    Inability: Never true  . Transportation needs:    Medical: No    Non-medical: No  Tobacco Use  . Smoking status: Former Research scientist (life sciences)  . Smokeless tobacco: Never Used  Substance and Sexual Activity  . Alcohol use: Not Currently  . Drug use: Not Currently  . Sexual activity: Not Currently  Lifestyle  . Physical activity:    Days per week: 0 days    Minutes per session: 0 min  . Stress: Not on file  Relationships  . Social connections:    Talks on phone: Not on file    Gets together: Not on file    Attends religious service: More than 4 times per year    Active member  of club or organization: No    Attends meetings of clubs or organizations: Never    Relationship status: Not on file  Other Topics Concern  . Not on file  Social History Narrative  . Not on file    Allergies:  Allergies  Allergen Reactions  . Aspirin Other (See Comments)    Unspecified  . Lisinopril Other (See Comments)    Metabolic Disorder Labs: No results found for: HGBA1C, MPG No results found for: PROLACTIN No results found for: CHOL, TRIG, HDL, CHOLHDL, VLDL, LDLCALC No results found for: TSH  Therapeutic Level  Labs: No results found for: LITHIUM No results found for: VALPROATE No components found for:  CBMZ  Current Medications: Current Outpatient Medications  Medication Sig Dispense Refill  . atorvastatin (LIPITOR) 10 MG tablet Take 10 mg by mouth daily.    . cyanocobalamin ([employer]S VITAMIN B12) 1000 MCG tablet Take 1 tablet by mouth daily    . donepezil (ARICEPT) 5 MG tablet Take 5 mg by mouth at bedtime.    . ferrous sulfate 325 (65 FE) MG tablet Take 325 mg by mouth daily with breakfast.    . LORazepam (ATIVAN) 0.5 MG tablet Take 1 tablet (0.5 mg total) by mouth 2 (two) times daily as needed for anxiety. 60 tablet 3  . losartan (COZAAR) 50 MG tablet Take 50 mg by mouth daily.    . mirtazapine (REMERON) 7.5 MG tablet Take 1 tablet (7.5 mg total) by mouth at bedtime. For sleep 90 tablet 0  . risperiDONE (RISPERDAL) 0.5 MG tablet Take 1-1.5 tablets (0.5-0.75 mg total) by mouth as directed. Take 1 tablet in the AM and 1.5 tablets PM 225 tablet 0  . risperiDONE (RISPERDAL) 0.5 MG tablet Take 1 tablet (0.5 mg total) by mouth as directed. Daily once as needed for agitation 90 tablet 0  . vitamin B-12 (CYANOCOBALAMIN) 500 MCG tablet Take 1,000 mcg by mouth daily.     No current facility-administered medications for this visit.      Musculoskeletal: Strength & Muscle Tone: wnl Gait & Station: normal Patient leans: N/A  Psychiatric Specialty Exam: Review of Systems  Psychiatric/Behavioral: Negative for depression. The patient is not nervous/anxious.   All other systems reviewed and are negative.   Blood pressure (!) 160/80, pulse 64, temperature 99 F (37.2 C), temperature source Oral, weight 137 lb 6.4 oz (62.3 kg).Body mass index is 25.13 kg/m.  General Appearance: Casual  Eye Contact:  Fair  Speech:  Clear and Coherent  Volume:  Normal  Mood:  Euthymic  Affect:  Congruent  Thought Process:  Goal Directed and Descriptions of Associations: Intact  Orientation:  Full (Time, Place, and  Person)  Thought Content: Logical   Suicidal Thoughts:  No  Homicidal Thoughts:  No  Memory:  Immediate;   Fair Recent;   Fair Remote;   Fair  Judgement:  Fair  Insight:  Fair  Psychomotor Activity:  Normal  Concentration:  Concentration: Fair and Attention Span: Fair  Recall:  [employer] of Knowledge: Fair  Language: Fair  Akathisia:  No  Handed:  Right  AIMS (if indicated):0  Assets:  Communication Skills Desire for Improvement Social Support  ADL's:  Intact  Cognition: WNL  Sleep:  Fair   Screenings:   Assessment and Plan: Kosha is an 84 year old African-American female, divorced, has a history of dementia with behavioral problems, insomnia, vitamin B12 deficiency, hyperlipidemia, hypertension, presented to the clinic today for a follow-up visit.  Patient presented along with  her legal guardian daughter-Leah Waller who provided collateral information.  Per daughter patient continues to make progress on the current medication regimen.  Will continue plan as noted below.  Plan Dementia with behavioral disturbances-improving Continue Aricept as prescribed per Dr. Ginette Pitman Continue risperidone 0.5 mg p.o. daily in the a.m. and 0.75 mg p.o. every afternoon. Continue risperidone 0.5 mg daily as needed for severe agitation. Ativan 0.5 mg twice daily as needed for agitation.  For insomnia-improving Mirtazapine 7.5 mg p.o. nightly  I have discussed with legal guardian-Leah Waller to sign a release to obtain neurology consult from her most recent neurology visit.  Elevated blood pressure reading Patient to follow-up with PMD  Follow-up in clinic in 3 months or sooner if needed.  I have spent atleast 15 minutes face to face with patient today. More than 50 % of the time was spent for psychoeducation and supportive psychotherapy and care coordination.    Ursula Alert, MD 07/13/2018, 5:25 PM

## 2018-07-14 DIAGNOSIS — I10 Essential (primary) hypertension: Secondary | ICD-10-CM | POA: Diagnosis not present

## 2018-07-15 DIAGNOSIS — I10 Essential (primary) hypertension: Secondary | ICD-10-CM | POA: Diagnosis not present

## 2018-07-16 DIAGNOSIS — I10 Essential (primary) hypertension: Secondary | ICD-10-CM | POA: Diagnosis not present

## 2018-07-17 DIAGNOSIS — I10 Essential (primary) hypertension: Secondary | ICD-10-CM | POA: Diagnosis not present

## 2018-07-18 DIAGNOSIS — I10 Essential (primary) hypertension: Secondary | ICD-10-CM | POA: Diagnosis not present

## 2018-07-19 DIAGNOSIS — I10 Essential (primary) hypertension: Secondary | ICD-10-CM | POA: Diagnosis not present

## 2018-07-27 DIAGNOSIS — I10 Essential (primary) hypertension: Secondary | ICD-10-CM | POA: Diagnosis not present

## 2018-07-28 DIAGNOSIS — I10 Essential (primary) hypertension: Secondary | ICD-10-CM | POA: Diagnosis not present

## 2018-07-29 DIAGNOSIS — I10 Essential (primary) hypertension: Secondary | ICD-10-CM | POA: Diagnosis not present

## 2018-07-30 DIAGNOSIS — I10 Essential (primary) hypertension: Secondary | ICD-10-CM | POA: Diagnosis not present

## 2018-07-31 DIAGNOSIS — I10 Essential (primary) hypertension: Secondary | ICD-10-CM | POA: Diagnosis not present

## 2018-08-01 DIAGNOSIS — I10 Essential (primary) hypertension: Secondary | ICD-10-CM | POA: Diagnosis not present

## 2018-08-02 DIAGNOSIS — I10 Essential (primary) hypertension: Secondary | ICD-10-CM | POA: Diagnosis not present

## 2018-08-03 DIAGNOSIS — I10 Essential (primary) hypertension: Secondary | ICD-10-CM | POA: Diagnosis not present

## 2018-08-04 DIAGNOSIS — I10 Essential (primary) hypertension: Secondary | ICD-10-CM | POA: Diagnosis not present

## 2018-08-05 DIAGNOSIS — I10 Essential (primary) hypertension: Secondary | ICD-10-CM | POA: Diagnosis not present

## 2018-08-06 DIAGNOSIS — I10 Essential (primary) hypertension: Secondary | ICD-10-CM | POA: Diagnosis not present

## 2018-08-07 DIAGNOSIS — I10 Essential (primary) hypertension: Secondary | ICD-10-CM | POA: Diagnosis not present

## 2018-08-08 DIAGNOSIS — I10 Essential (primary) hypertension: Secondary | ICD-10-CM | POA: Diagnosis not present

## 2018-08-09 DIAGNOSIS — I10 Essential (primary) hypertension: Secondary | ICD-10-CM | POA: Diagnosis not present

## 2018-08-10 DIAGNOSIS — I10 Essential (primary) hypertension: Secondary | ICD-10-CM | POA: Diagnosis not present

## 2018-08-11 DIAGNOSIS — I10 Essential (primary) hypertension: Secondary | ICD-10-CM | POA: Diagnosis not present

## 2018-08-12 DIAGNOSIS — I10 Essential (primary) hypertension: Secondary | ICD-10-CM | POA: Diagnosis not present

## 2018-08-13 ENCOUNTER — Emergency Department: Payer: Medicare HMO

## 2018-08-13 ENCOUNTER — Emergency Department
Admission: EM | Admit: 2018-08-13 | Discharge: 2018-08-13 | Disposition: A | Discharge status: Home / Self Care | Payer: Medicare HMO | Attending: Emergency Medicine | Admitting: Emergency Medicine

## 2018-08-13 ENCOUNTER — Other Ambulatory Visit: Payer: Self-pay

## 2018-08-13 DIAGNOSIS — S0990XA Unspecified injury of head, initial encounter: Secondary | ICD-10-CM | POA: Diagnosis present

## 2018-08-13 DIAGNOSIS — Y999 Unspecified external cause status: Secondary | ICD-10-CM | POA: Insufficient documentation

## 2018-08-13 DIAGNOSIS — I10 Essential (primary) hypertension: Secondary | ICD-10-CM | POA: Diagnosis not present

## 2018-08-13 DIAGNOSIS — Z87891 Personal history of nicotine dependence: Secondary | ICD-10-CM | POA: Insufficient documentation

## 2018-08-13 DIAGNOSIS — Y92129 Unspecified place in nursing home as the place of occurrence of the external cause: Secondary | ICD-10-CM | POA: Diagnosis not present

## 2018-08-13 DIAGNOSIS — S0003XA Contusion of scalp, initial encounter: Secondary | ICD-10-CM | POA: Insufficient documentation

## 2018-08-13 DIAGNOSIS — Y939 Activity, unspecified: Secondary | ICD-10-CM | POA: Diagnosis not present

## 2018-08-13 DIAGNOSIS — W19XXXA Unspecified fall, initial encounter: Secondary | ICD-10-CM | POA: Diagnosis not present

## 2018-08-13 DIAGNOSIS — I129 Hypertensive chronic kidney disease with stage 1 through stage 4 chronic kidney disease, or unspecified chronic kidney disease: Secondary | ICD-10-CM | POA: Insufficient documentation

## 2018-08-13 DIAGNOSIS — T148XXA Other injury of unspecified body region, initial encounter: Secondary | ICD-10-CM

## 2018-08-13 DIAGNOSIS — G308 Other Alzheimer's disease: Secondary | ICD-10-CM | POA: Diagnosis not present

## 2018-08-13 DIAGNOSIS — Z79899 Other long term (current) drug therapy: Secondary | ICD-10-CM | POA: Diagnosis not present

## 2018-08-13 DIAGNOSIS — N189 Chronic kidney disease, unspecified: Secondary | ICD-10-CM | POA: Insufficient documentation

## 2018-08-13 DIAGNOSIS — F0280 Dementia in other diseases classified elsewhere without behavioral disturbance: Secondary | ICD-10-CM | POA: Insufficient documentation

## 2018-08-13 LAB — URINALYSIS, COMPLETE (UACMP) WITH MICROSCOPIC
Bacteria, UA: NONE SEEN
Bilirubin Urine: NEGATIVE
Glucose, UA: NEGATIVE mg/dL
Ketones, ur: NEGATIVE mg/dL
LEUKOCYTE UA: NEGATIVE
Nitrite: NEGATIVE
Protein, ur: NEGATIVE mg/dL
Specific Gravity, Urine: 1.018 (ref 1.005–1.030)
pH: 5 (ref 5.0–8.0)

## 2018-08-13 LAB — COMPREHENSIVE METABOLIC PANEL
ALT: 28 U/L (ref 0–44)
ANION GAP: 7 (ref 5–15)
AST: 26 U/L (ref 15–41)
Albumin: 3 g/dL — ABNORMAL LOW (ref 3.5–5.0)
Alkaline Phosphatase: 57 U/L (ref 38–126)
BUN: 19 mg/dL (ref 8–23)
CO2: 25 mmol/L (ref 22–32)
Calcium: 8.5 mg/dL — ABNORMAL LOW (ref 8.9–10.3)
Chloride: 108 mmol/L (ref 98–111)
Creatinine, Ser: 1.13 mg/dL — ABNORMAL HIGH (ref 0.44–1.00)
GFR calc non Af Amer: 45 mL/min — ABNORMAL LOW (ref >60)
GFR, EST AFRICAN AMERICAN: 52 mL/min — AB (ref >60)
Glucose, Bld: 120 mg/dL — ABNORMAL HIGH (ref 70–99)
Potassium: 3.8 mmol/L (ref 3.5–5.1)
Sodium: 140 mmol/L (ref 135–145)
Total Bilirubin: 1 mg/dL (ref 0.3–1.2)
Total Protein: 7.2 g/dL (ref 6.5–8.1)

## 2018-08-13 LAB — CBC
HCT: 32.1 % — ABNORMAL LOW (ref 36.0–46.0)
Hemoglobin: 10.2 g/dL — ABNORMAL LOW (ref 12.0–15.0)
MCH: 30.7 pg (ref 26.0–34.0)
MCHC: 31.8 g/dL (ref 30.0–36.0)
MCV: 96.7 fL (ref 80.0–100.0)
Platelets: 223 10*3/uL (ref 150–400)
RBC: 3.32 MIL/uL — ABNORMAL LOW (ref 3.87–5.11)
RDW: 12.6 % (ref 11.5–15.5)
WBC: 7.7 10*3/uL (ref 4.0–10.5)
nRBC: 0 % (ref 0.0–0.2)

## 2018-08-13 NOTE — ED Notes (Signed)
Called and left a message with the pt daughter [name] to inform her of the pt pending discharge back to [facility]

## 2018-08-13 NOTE — ED Triage Notes (Signed)
Pt comes into the ED via EMS from [facility] with c/o finding the pt in the floor, pt has a hematoma of the left brow, pt is unsure how she fell. States she normally is good with memory but seems off today.pt is alert on arrival , responds verbally.

## 2018-08-13 NOTE — ED Provider Notes (Signed)
[facility] Emergency Department Provider Note  Time seen: 8:15 AM  I have reviewed the triage vital signs and the nursing notes.   HISTORY  Chief Complaint Fall and Altered Mental Status    HPI Leah Waller is a 84 y.o. female a past medical history of anxiety, hypertension, dementia, presents to the emergency department from a nursing facility after a fall.  According to EMS the patient was found down on her floor, patient does not recall the fall and cannot contribute to her history due to baseline dementia.  EMS reports that the patient was seemingly somewhat more confused than her baseline this morning.  Currently the patient appears well, calm cooperative, answering questions appropriately and following commands but does have baseline dementia.   Past Medical History:  Diagnosis Date  . Anxiety   . B12 deficiency   . Bipolar disorder ([place])   . Dementia ([place])   . Hypertension     Patient Active Problem List   Diagnosis Date Noted  . Memory deficit 04/14/2018  . Hypertension 04/14/2018  . Hyperlipidemia 04/14/2018  . CKD (chronic kidney disease) 04/14/2018  . Anxiety 04/14/2018  . Alzheimer's dementia with behavioral disturbance ([place]) 12/22/2017  . Low vitamin B12 level 10/11/2014    No past surgical history on file.  Prior to Admission medications   Medication Sig Start Date End Date Taking? Authorizing Provider  atorvastatin (LIPITOR) 10 MG tablet Take 10 mg by mouth daily.    [provider]  cyanocobalamin ([facility] VITAMIN B12) 1000 MCG tablet Take 1 tablet by mouth daily 03/17/18   [provider]  donepezil (ARICEPT) 5 MG tablet Take 5 mg by mouth at bedtime.    [provider]  ferrous sulfate 325 (65 FE) MG tablet Take 325 mg by mouth daily with breakfast.    [provider]  LORazepam (ATIVAN) 0.5 MG tablet Take 1 tablet (0.5 mg total) by mouth 2 (two) times daily as needed for anxiety. 04/28/18    Ursula Alert, MD  losartan (COZAAR) 50 MG tablet Take 50 mg by mouth daily.    [provider]  mirtazapine (REMERON) 7.5 MG tablet Take 1 tablet (7.5 mg total) by mouth at bedtime. For sleep 07/13/18   Ursula Alert, MD  risperiDONE (RISPERDAL) 0.5 MG tablet Take 1-1.5 tablets (0.5-0.75 mg total) by mouth as directed. Take 1 tablet in the AM and 1.5 tablets PM 07/13/18 10/11/18  Ursula Alert, MD  risperiDONE (RISPERDAL) 0.5 MG tablet Take 1 tablet (0.5 mg total) by mouth as directed. Daily once as needed for agitation 07/13/18   Ursula Alert, MD  vitamin B-12 (CYANOCOBALAMIN) 500 MCG tablet Take 1,000 mcg by mouth daily.    [provider]    Allergies  Allergen Reactions  . Aspirin Other (See Comments)    Unspecified  . Lisinopril Other (See Comments)    Family History  Family history unknown: Yes    Social History Social History   Tobacco Use  . Smoking status: Former Research scientist (life sciences)  . Smokeless tobacco: Never Used  Substance Use Topics  . Alcohol use: Not Currently  . Drug use: Not Currently    Review of Systems Unable to obtain an adequate/accurate review of systems secondary to baseline dementia. ____________________________________________   PHYSICAL EXAM:  VITAL SIGNS: ED Triage Vitals [08/13/18 0812]  Enc Vitals Group     BP (!) 165/69     Pulse Rate 82     Resp 18  Temp 98.4 F (36.9 C)     Temp Source Oral     SpO2 97 %     Weight      Height      Head Circumference      Peak Flow      Pain Score      Pain Loc      Pain Edu?      Excl. in [place]?     Constitutional: Alert, overall well-appearing, no distress Eyes: Normal exam ENT   Head: Moderate hematoma above left eye   Nose: No congestion/rhinnorhea.  No septal hematoma.   Mouth/Throat: Mucous membranes are moist. Cardiovascular: Normal rate, regular rhythm. Respiratory: Normal respiratory effort without tachypnea nor retractions. Breath sounds are clear   Gastrointestinal: Soft and nontender. No distention.   Musculoskeletal: Good range of motion in all extremities without pain elicited.  Extremities appear nontender and atraumatic.  No C-spine tenderness. Neurologic:  Normal speech and language. No gross focal neurologic deficits  Skin:  Skin is warm, dry and intact.  Psychiatric: Mood and affect are normal.   ____________________________________________   RADIOLOGY   IMPRESSION: 1. No acute intracranial abnormalities. 2. Atrophy and mild chronic microvascular ischemic change. 3. Left inferior frontal/supraorbital scalp hematoma.  ____________________________________________   INITIAL IMPRESSION / ASSESSMENT AND PLAN / ED COURSE  Pertinent labs & imaging results that were available during my care of the patient were reviewed by me and considered in my medical decision making (see chart for details).  Patient presents to the emergency department after a fall, unwitnessed, possibly increased confusion today as well.  We will check labs including blood and urine as well as a CT scan of the head.  Patient agreeable to plan of care.  CT scan shows no acute abnormality, left frontal scalp hematoma otherwise negative.  Patient's lab work is largely nonrevealing, overall reassuring.  Patient will be discharged back to her nursing facility.  ____________________________________________   FINAL CLINICAL IMPRESSION(S) / ED DIAGNOSES  Cheral Marker, MD 08/13/18 [phone]

## 2018-08-13 NOTE — ED Notes (Addendum)
Pt's daughter (legal guardian) here to pick up patient and take her back to facility. Daughter updated on discharge instructions and states no questions at this time. Pt dressed and assisted to wheelchair. Pt assisted to car.

## 2018-08-14 DIAGNOSIS — I10 Essential (primary) hypertension: Secondary | ICD-10-CM | POA: Diagnosis not present

## 2018-08-15 DIAGNOSIS — I10 Essential (primary) hypertension: Secondary | ICD-10-CM | POA: Diagnosis not present

## 2018-08-16 DIAGNOSIS — I10 Essential (primary) hypertension: Secondary | ICD-10-CM | POA: Diagnosis not present

## 2018-08-17 DIAGNOSIS — M2042 Other hammer toe(s) (acquired), left foot: Secondary | ICD-10-CM | POA: Diagnosis not present

## 2018-08-17 DIAGNOSIS — M79675 Pain in left toe(s): Secondary | ICD-10-CM | POA: Diagnosis not present

## 2018-08-17 DIAGNOSIS — M898X9 Other specified disorders of bone, unspecified site: Secondary | ICD-10-CM | POA: Diagnosis not present

## 2018-08-17 DIAGNOSIS — I10 Essential (primary) hypertension: Secondary | ICD-10-CM | POA: Diagnosis not present

## 2018-08-17 DIAGNOSIS — M79674 Pain in right toe(s): Secondary | ICD-10-CM | POA: Diagnosis not present

## 2018-08-17 DIAGNOSIS — B351 Tinea unguium: Secondary | ICD-10-CM | POA: Diagnosis not present

## 2018-08-17 DIAGNOSIS — M2041 Other hammer toe(s) (acquired), right foot: Secondary | ICD-10-CM | POA: Diagnosis not present

## 2018-08-18 DIAGNOSIS — I10 Essential (primary) hypertension: Secondary | ICD-10-CM | POA: Diagnosis not present

## 2018-08-19 DIAGNOSIS — I10 Essential (primary) hypertension: Secondary | ICD-10-CM | POA: Diagnosis not present

## 2018-08-20 DIAGNOSIS — I10 Essential (primary) hypertension: Secondary | ICD-10-CM | POA: Diagnosis not present

## 2018-08-21 DIAGNOSIS — I10 Essential (primary) hypertension: Secondary | ICD-10-CM | POA: Diagnosis not present

## 2018-08-22 DIAGNOSIS — I10 Essential (primary) hypertension: Secondary | ICD-10-CM | POA: Diagnosis not present

## 2018-08-23 DIAGNOSIS — I10 Essential (primary) hypertension: Secondary | ICD-10-CM | POA: Diagnosis not present

## 2018-08-24 DIAGNOSIS — I10 Essential (primary) hypertension: Secondary | ICD-10-CM | POA: Diagnosis not present

## 2018-08-25 DIAGNOSIS — I10 Essential (primary) hypertension: Secondary | ICD-10-CM | POA: Diagnosis not present

## 2018-08-26 DIAGNOSIS — I10 Essential (primary) hypertension: Secondary | ICD-10-CM | POA: Diagnosis not present

## 2018-08-27 DIAGNOSIS — I10 Essential (primary) hypertension: Secondary | ICD-10-CM | POA: Diagnosis not present

## 2018-08-28 DIAGNOSIS — I10 Essential (primary) hypertension: Secondary | ICD-10-CM | POA: Diagnosis not present

## 2018-08-29 DIAGNOSIS — I10 Essential (primary) hypertension: Secondary | ICD-10-CM | POA: Diagnosis not present

## 2018-08-30 DIAGNOSIS — I10 Essential (primary) hypertension: Secondary | ICD-10-CM | POA: Diagnosis not present

## 2018-08-31 DIAGNOSIS — I10 Essential (primary) hypertension: Secondary | ICD-10-CM | POA: Diagnosis not present

## 2018-09-01 DIAGNOSIS — I10 Essential (primary) hypertension: Secondary | ICD-10-CM | POA: Diagnosis not present

## 2018-09-02 DIAGNOSIS — I10 Essential (primary) hypertension: Secondary | ICD-10-CM | POA: Diagnosis not present

## 2018-09-03 DIAGNOSIS — I10 Essential (primary) hypertension: Secondary | ICD-10-CM | POA: Diagnosis not present

## 2018-09-04 DIAGNOSIS — I10 Essential (primary) hypertension: Secondary | ICD-10-CM | POA: Diagnosis not present

## 2018-09-05 DIAGNOSIS — I10 Essential (primary) hypertension: Secondary | ICD-10-CM | POA: Diagnosis not present

## 2018-09-06 DIAGNOSIS — I10 Essential (primary) hypertension: Secondary | ICD-10-CM | POA: Diagnosis not present

## 2018-09-07 DIAGNOSIS — I10 Essential (primary) hypertension: Secondary | ICD-10-CM | POA: Diagnosis not present

## 2018-09-08 DIAGNOSIS — I10 Essential (primary) hypertension: Secondary | ICD-10-CM | POA: Diagnosis not present

## 2018-09-09 DIAGNOSIS — I10 Essential (primary) hypertension: Secondary | ICD-10-CM | POA: Diagnosis not present

## 2018-09-10 DIAGNOSIS — I10 Essential (primary) hypertension: Secondary | ICD-10-CM | POA: Diagnosis not present

## 2018-09-11 DIAGNOSIS — I10 Essential (primary) hypertension: Secondary | ICD-10-CM | POA: Diagnosis not present

## 2018-09-12 DIAGNOSIS — I10 Essential (primary) hypertension: Secondary | ICD-10-CM | POA: Diagnosis not present

## 2018-09-13 DIAGNOSIS — I10 Essential (primary) hypertension: Secondary | ICD-10-CM | POA: Diagnosis not present

## 2018-09-14 DIAGNOSIS — I10 Essential (primary) hypertension: Secondary | ICD-10-CM | POA: Diagnosis not present

## 2018-09-15 DIAGNOSIS — I10 Essential (primary) hypertension: Secondary | ICD-10-CM | POA: Diagnosis not present

## 2018-09-16 DIAGNOSIS — I10 Essential (primary) hypertension: Secondary | ICD-10-CM | POA: Diagnosis not present

## 2018-09-17 DIAGNOSIS — I10 Essential (primary) hypertension: Secondary | ICD-10-CM | POA: Diagnosis not present

## 2018-09-18 DIAGNOSIS — I10 Essential (primary) hypertension: Secondary | ICD-10-CM | POA: Diagnosis not present

## 2018-09-19 DIAGNOSIS — I10 Essential (primary) hypertension: Secondary | ICD-10-CM | POA: Diagnosis not present

## 2018-09-20 DIAGNOSIS — I10 Essential (primary) hypertension: Secondary | ICD-10-CM | POA: Diagnosis not present

## 2018-10-12 ENCOUNTER — Other Ambulatory Visit: Payer: Self-pay

## 2018-10-12 ENCOUNTER — Ambulatory Visit: Payer: Medicare HMO | Admitting: Psychiatry

## 2018-10-12 ENCOUNTER — Telehealth: Payer: Self-pay

## 2018-10-12 DIAGNOSIS — G309 Alzheimer's disease, unspecified: Principal | ICD-10-CM

## 2018-10-12 DIAGNOSIS — F0281 Dementia in other diseases classified elsewhere with behavioral disturbance: Secondary | ICD-10-CM

## 2018-10-12 MED ORDER — RISPERIDONE 0.5 MG PO TABS: 0.5000 mg | ORAL_TABLET | ORAL | 0 refills | Status: DC

## 2018-10-12 MED ORDER — MIRTAZAPINE 7.5 MG PO TABS: 7.5000 mg | ORAL_TABLET | Freq: Every day | ORAL | 0 refills | Status: DC

## 2018-10-12 MED ORDER — RISPERIDONE 0.5 MG PO TABS: 0.5000 mg | ORAL_TABLET | Freq: Every day | ORAL | 1 refills | Status: DC | PRN

## 2018-10-12 MED ORDER — LORAZEPAM 0.5 MG PO TABS: 0.5000 mg | ORAL_TABLET | Freq: Two times a day (BID) | ORAL | 1 refills | Status: DC | PRN

## 2018-10-12 NOTE — Telephone Encounter (Signed)
spoke with pt daughter she states her mom is in lockdown and can not go out. but she states that she is doing very well but will need to get a refill on her medications.

## 2018-10-12 NOTE — Telephone Encounter (Signed)
Sent meds risperidone, remeron, ativan to pharmacy

## 2018-10-28 DIAGNOSIS — Z1159 Encounter for screening for other viral diseases: Secondary | ICD-10-CM | POA: Diagnosis not present

## 2018-11-30 DIAGNOSIS — I10 Essential (primary) hypertension: Secondary | ICD-10-CM | POA: Diagnosis not present

## 2018-12-01 DIAGNOSIS — I10 Essential (primary) hypertension: Secondary | ICD-10-CM | POA: Diagnosis not present

## 2018-12-02 DIAGNOSIS — I10 Essential (primary) hypertension: Secondary | ICD-10-CM | POA: Diagnosis not present

## 2018-12-03 DIAGNOSIS — I10 Essential (primary) hypertension: Secondary | ICD-10-CM | POA: Diagnosis not present

## 2018-12-04 DIAGNOSIS — I10 Essential (primary) hypertension: Secondary | ICD-10-CM | POA: Diagnosis not present

## 2018-12-05 DIAGNOSIS — I10 Essential (primary) hypertension: Secondary | ICD-10-CM | POA: Diagnosis not present

## 2018-12-06 DIAGNOSIS — I10 Essential (primary) hypertension: Secondary | ICD-10-CM | POA: Diagnosis not present

## 2018-12-07 DIAGNOSIS — I10 Essential (primary) hypertension: Secondary | ICD-10-CM | POA: Diagnosis not present

## 2018-12-08 DIAGNOSIS — I10 Essential (primary) hypertension: Secondary | ICD-10-CM | POA: Diagnosis not present

## 2018-12-09 DIAGNOSIS — I10 Essential (primary) hypertension: Secondary | ICD-10-CM | POA: Diagnosis not present

## 2018-12-10 DIAGNOSIS — I10 Essential (primary) hypertension: Secondary | ICD-10-CM | POA: Diagnosis not present

## 2018-12-11 DIAGNOSIS — I10 Essential (primary) hypertension: Secondary | ICD-10-CM | POA: Diagnosis not present

## 2018-12-12 DIAGNOSIS — I10 Essential (primary) hypertension: Secondary | ICD-10-CM | POA: Diagnosis not present

## 2018-12-13 DIAGNOSIS — I10 Essential (primary) hypertension: Secondary | ICD-10-CM | POA: Diagnosis not present

## 2018-12-28 DIAGNOSIS — I10 Essential (primary) hypertension: Secondary | ICD-10-CM | POA: Diagnosis not present

## 2018-12-29 DIAGNOSIS — I10 Essential (primary) hypertension: Secondary | ICD-10-CM | POA: Diagnosis not present

## 2018-12-30 DIAGNOSIS — I10 Essential (primary) hypertension: Secondary | ICD-10-CM | POA: Diagnosis not present

## 2018-12-31 DIAGNOSIS — I10 Essential (primary) hypertension: Secondary | ICD-10-CM | POA: Diagnosis not present

## 2019-01-01 DIAGNOSIS — I10 Essential (primary) hypertension: Secondary | ICD-10-CM | POA: Diagnosis not present

## 2019-01-02 DIAGNOSIS — I10 Essential (primary) hypertension: Secondary | ICD-10-CM | POA: Diagnosis not present

## 2019-01-03 DIAGNOSIS — I10 Essential (primary) hypertension: Secondary | ICD-10-CM | POA: Diagnosis not present

## 2019-01-04 DIAGNOSIS — I10 Essential (primary) hypertension: Secondary | ICD-10-CM | POA: Diagnosis not present

## 2019-01-05 DIAGNOSIS — I10 Essential (primary) hypertension: Secondary | ICD-10-CM | POA: Diagnosis not present

## 2019-01-06 DIAGNOSIS — I10 Essential (primary) hypertension: Secondary | ICD-10-CM | POA: Diagnosis not present

## 2019-01-07 DIAGNOSIS — I10 Essential (primary) hypertension: Secondary | ICD-10-CM | POA: Diagnosis not present

## 2019-01-08 DIAGNOSIS — I10 Essential (primary) hypertension: Secondary | ICD-10-CM | POA: Diagnosis not present

## 2019-01-09 DIAGNOSIS — I10 Essential (primary) hypertension: Secondary | ICD-10-CM | POA: Diagnosis not present

## 2019-01-10 DIAGNOSIS — I10 Essential (primary) hypertension: Secondary | ICD-10-CM | POA: Diagnosis not present

## 2019-04-30 ENCOUNTER — Emergency Department: Payer: Medicare HMO

## 2019-04-30 ENCOUNTER — Other Ambulatory Visit: Payer: Self-pay

## 2019-04-30 ENCOUNTER — Encounter: Payer: Self-pay | Admitting: Emergency Medicine

## 2019-04-30 ENCOUNTER — Emergency Department
Admission: EM | Admit: 2019-04-30 | Discharge: 2019-04-30 | Disposition: A | Discharge status: Home / Self Care | Payer: Medicare HMO | Attending: Emergency Medicine | Admitting: Emergency Medicine

## 2019-04-30 DIAGNOSIS — S79911A Unspecified injury of right hip, initial encounter: Secondary | ICD-10-CM | POA: Diagnosis not present

## 2019-04-30 DIAGNOSIS — M25551 Pain in right hip: Secondary | ICD-10-CM | POA: Insufficient documentation

## 2019-04-30 DIAGNOSIS — N189 Chronic kidney disease, unspecified: Secondary | ICD-10-CM | POA: Diagnosis not present

## 2019-04-30 DIAGNOSIS — F0281 Dementia in other diseases classified elsewhere with behavioral disturbance: Secondary | ICD-10-CM | POA: Insufficient documentation

## 2019-04-30 DIAGNOSIS — G309 Alzheimer's disease, unspecified: Secondary | ICD-10-CM | POA: Insufficient documentation

## 2019-04-30 DIAGNOSIS — Z87891 Personal history of nicotine dependence: Secondary | ICD-10-CM | POA: Insufficient documentation

## 2019-04-30 DIAGNOSIS — S0003XA Contusion of scalp, initial encounter: Secondary | ICD-10-CM | POA: Diagnosis not present

## 2019-04-30 DIAGNOSIS — I129 Hypertensive chronic kidney disease with stage 1 through stage 4 chronic kidney disease, or unspecified chronic kidney disease: Secondary | ICD-10-CM | POA: Diagnosis not present

## 2019-04-30 DIAGNOSIS — W19XXXA Unspecified fall, initial encounter: Secondary | ICD-10-CM

## 2019-04-30 DIAGNOSIS — R404 Transient alteration of awareness: Secondary | ICD-10-CM | POA: Diagnosis not present

## 2019-04-30 DIAGNOSIS — R52 Pain, unspecified: Secondary | ICD-10-CM | POA: Diagnosis not present

## 2019-04-30 DIAGNOSIS — I10 Essential (primary) hypertension: Secondary | ICD-10-CM | POA: Diagnosis not present

## 2019-04-30 DIAGNOSIS — M25572 Pain in left ankle and joints of left foot: Secondary | ICD-10-CM | POA: Diagnosis not present

## 2019-04-30 LAB — CBC WITH DIFFERENTIAL/PLATELET
Abs Immature Granulocytes: 0.05 10*3/uL (ref 0.00–0.07)
Basophils Absolute: 0 10*3/uL (ref 0.0–0.1)
Basophils Relative: 0 %
Eosinophils Absolute: 0 10*3/uL (ref 0.0–0.5)
Eosinophils Relative: 0 %
HCT: 35.5 % — ABNORMAL LOW (ref 36.0–46.0)
Hemoglobin: 11.2 g/dL — ABNORMAL LOW (ref 12.0–15.0)
Immature Granulocytes: 1 %
Lymphocytes Relative: 9 %
Lymphs Abs: 0.8 10*3/uL (ref 0.7–4.0)
MCH: 30.4 pg (ref 26.0–34.0)
MCHC: 31.5 g/dL (ref 30.0–36.0)
MCV: 96.5 fL (ref 80.0–100.0)
Monocytes Absolute: 1 10*3/uL (ref 0.1–1.0)
Monocytes Relative: 12 %
Neutro Abs: 6.7 10*3/uL (ref 1.7–7.7)
Neutrophils Relative %: 78 %
Platelets: 225 10*3/uL (ref 150–400)
RBC: 3.68 MIL/uL — ABNORMAL LOW (ref 3.87–5.11)
RDW: 13.2 % (ref 11.5–15.5)
WBC: 8.5 10*3/uL (ref 4.0–10.5)
nRBC: 0 % (ref 0.0–0.2)

## 2019-04-30 LAB — COMPREHENSIVE METABOLIC PANEL
ALT: 13 U/L (ref 0–44)
AST: 18 U/L (ref 15–41)
Albumin: 3.4 g/dL — ABNORMAL LOW (ref 3.5–5.0)
Alkaline Phosphatase: 89 U/L (ref 38–126)
Anion gap: 9 (ref 5–15)
BUN: 35 mg/dL — ABNORMAL HIGH (ref 8–23)
CO2: 24 mmol/L (ref 22–32)
Calcium: 9.1 mg/dL (ref 8.9–10.3)
Chloride: 110 mmol/L (ref 98–111)
Creatinine, Ser: 1.73 mg/dL — ABNORMAL HIGH (ref 0.44–1.00)
GFR calc Af Amer: 31 mL/min — ABNORMAL LOW (ref >60)
GFR calc non Af Amer: 27 mL/min — ABNORMAL LOW (ref >60)
Glucose, Bld: 117 mg/dL — ABNORMAL HIGH (ref 70–99)
Potassium: 4.3 mmol/L (ref 3.5–5.1)
Sodium: 143 mmol/L (ref 135–145)
Total Bilirubin: 0.4 mg/dL (ref 0.3–1.2)
Total Protein: 8.1 g/dL (ref 6.5–8.1)

## 2019-04-30 NOTE — ED Triage Notes (Signed)
Pt arrived via [facility] following a fall this morning. Staff stated they found the pt in the floor this morning tangled up in her sheets. They put her back to bed, then when trying to ambulate and pt was c/o rt hip pain. Pt has hx of alzheimers and HTN.

## 2019-04-30 NOTE — ED Provider Notes (Signed)
[facility] Emergency Department Provider Note       Time seen: ----------------------------------------- 7:07 AM on 04/30/2019 ----------------------------------------- Level V caveat: History/ROS limited by altered mental status  I have reviewed the triage vital signs and the nursing notes.  HISTORY   Chief Complaint No chief complaint on file.    HPI Leah Waller is a 84 y.o. female with a history of anxiety, bipolar disorder, dementia, hypertension who presents to the ED for a fall.  Patient had an unwitnessed fall at the nursing home.  She was found beside her bed, she was believed to have been tripped by her blanket.  She was complaining of right hip pain.  No other information is available.  Past Medical History:  Diagnosis Date  . Anxiety   . B12 deficiency   . Bipolar disorder ([place])   . Dementia ([place])   . Hypertension     Patient Active Problem List   Diagnosis Date Noted  . Memory deficit 04/14/2018  . Hypertension 04/14/2018  . Hyperlipidemia 04/14/2018  . CKD (chronic kidney disease) 04/14/2018  . Anxiety 04/14/2018  . Alzheimer's dementia with behavioral disturbance ([place]) 12/22/2017  . Low vitamin B12 level 10/11/2014    No past surgical history on file.  Allergies Aspirin and Lisinopril  Social History Social History   Tobacco Use  . Smoking status: Former Research scientist (life sciences)  . Smokeless tobacco: Never Used  Substance Use Topics  . Alcohol use: Not Currently  . Drug use: Not Currently    Review of Systems  Musculoskeletal: Positive right hip pain   All systems negative/normal/unremarkable except as stated in the HPI  ____________________________________________   PHYSICAL EXAM:  VITAL SIGNS: ED Triage Vitals  Enc Vitals Group     BP      Pulse      Resp      Temp      Temp src      SpO2      Weight      Height      Head Circumference      Peak Flow      Pain Score      Pain Loc      Pain Edu?       Excl. in [place]?    Constitutional: Alert but disoriented, well appearing and in no distress. Eyes: Conjunctivae are normal. Normal extraocular movements. ENT      Head: Normocephalic and atraumatic.      Nose: No congestion/rhinnorhea.      Mouth/Throat: Mucous membranes are moist.      Neck: No stridor. Cardiovascular: Normal rate, regular rhythm. No murmurs, rubs, or gallops. Respiratory: Normal respiratory effort without tachypnea nor retractions. Breath sounds are clear and equal bilaterally. No wheezes/rales/rhonchi. Gastrointestinal: Soft and nontender. Normal bowel sounds Musculoskeletal: Severe pain with range of motion of the right hip Neurologic:  Normal speech and language. No gross focal neurologic deficits are appreciated.  Skin:  Skin is warm, dry and intact. No rash noted. Psychiatric: Mood and affect are normal. ____________________________________________  ED COURSE:  As part of my medical decision making, I reviewed the following data within the [place] History obtained from family if available, nursing notes, old chart and ekg, as well as notes from prior ED visits. Patient presented for right hip pain after a fall, we will assess with labs and imaging as indicated at this time.   Procedures  Leah Waller was evaluated in Emergency Department on 04/30/2019 for  the symptoms described in the history of present illness. She was evaluated in the context of the global COVID-19 pandemic, which necessitated consideration that the patient might be at risk for infection with the SARS-CoV-2 virus that causes COVID-19. Institutional protocols and algorithms that pertain to the evaluation of patients at risk for COVID-19 are in a state of rapid change based on information released by regulatory bodies including the CDC and federal and state organizations. These policies and algorithms were followed during the patient's care in the ED.   ____________________________________________   LABS (pertinent positives/negatives)  Labs Reviewed  CBC WITH DIFFERENTIAL/PLATELET - Abnormal; Notable for the following components:      Result Value   RBC 3.68 (*)    Hemoglobin 11.2 (*)    HCT 35.5 (*)    All other components within normal limits  COMPREHENSIVE METABOLIC PANEL - Abnormal; Notable for the following components:   Glucose, Bld 117 (*)    BUN 35 (*)    Creatinine, Ser 1.73 (*)    Albumin 3.4 (*)    GFR calc non Af Amer 27 (*)    GFR calc Af Amer 31 (*)    All other components within normal limits    RADIOLOGY Images were viewed by me  CT head, right hip x-rays IMPRESSION:  Atrophy with patchy periventricular small vessel disease. No acute  infarct. No mass or hemorrhage.   Small frontal scalp hematoma on the left. Foci of arterial vascular  calcification noted. Mucosal thickening and opacification in several  ethmoid air cells.   IMPRESSION:  No fracture or dislocation. Slight symmetric narrowing of each hip  joint. No erosive change.  ____________________________________________   DIFFERENTIAL DIAGNOSIS   Contusion, hip fracture, pelvic fracture, subdural, dementia  FINAL ASSESSMENT AND PLAN  Fall, right hip pain   Plan: The patient had presented for a fall with resulting right hip pain. Patient's labs did reveal some elevation in her BUN and creatinine of uncertain significance. Patient's imaging does not reveal any acute process.  We got her up and she was able to ambulate, she was not complaining of any pain in the hips.  She is cleared for outpatient follow-up.   Laurence Aly, MD    Note: This note was generated in part or whole with voice recognition software. Voice recognition is usually quite accurate but there are transcription errors that can and very often do occur. I apologize for any typographical errors that were not detected and corrected.     Earleen Newport,  MD 04/30/19 [phone]

## 2019-04-30 NOTE — ED Notes (Signed)
Patient transported to X-ray 

## 2019-04-30 NOTE — ED Notes (Signed)
Assisted pt to stand and ambulate two steps.

## 2019-04-30 NOTE — ED Notes (Signed)
Pt unable to sign DC paperwork r/t dementia. Gave report to [place] at [facility] and [employer] transported pt back to facility.

## 2019-05-24 DIAGNOSIS — E782 Mixed hyperlipidemia: Secondary | ICD-10-CM | POA: Diagnosis not present

## 2019-05-24 DIAGNOSIS — I129 Hypertensive chronic kidney disease with stage 1 through stage 4 chronic kidney disease, or unspecified chronic kidney disease: Secondary | ICD-10-CM | POA: Diagnosis not present

## 2019-05-24 DIAGNOSIS — F419 Anxiety disorder, unspecified: Secondary | ICD-10-CM | POA: Diagnosis not present

## 2019-05-24 DIAGNOSIS — G301 Alzheimer's disease with late onset: Secondary | ICD-10-CM | POA: Diagnosis not present

## 2019-05-24 DIAGNOSIS — F0281 Dementia in other diseases classified elsewhere with behavioral disturbance: Secondary | ICD-10-CM | POA: Diagnosis not present

## 2019-05-24 DIAGNOSIS — D631 Anemia in chronic kidney disease: Secondary | ICD-10-CM | POA: Diagnosis not present

## 2019-05-24 DIAGNOSIS — N183 Chronic kidney disease, stage 3 unspecified: Secondary | ICD-10-CM | POA: Diagnosis not present

## 2019-05-24 DIAGNOSIS — E538 Deficiency of other specified B group vitamins: Secondary | ICD-10-CM | POA: Diagnosis not present

## 2019-05-24 DIAGNOSIS — F319 Bipolar disorder, unspecified: Secondary | ICD-10-CM | POA: Diagnosis not present

## 2019-06-01 DIAGNOSIS — F319 Bipolar disorder, unspecified: Secondary | ICD-10-CM | POA: Diagnosis not present

## 2019-06-01 DIAGNOSIS — E782 Mixed hyperlipidemia: Secondary | ICD-10-CM | POA: Diagnosis not present

## 2019-06-01 DIAGNOSIS — E538 Deficiency of other specified B group vitamins: Secondary | ICD-10-CM | POA: Diagnosis not present

## 2019-06-01 DIAGNOSIS — F419 Anxiety disorder, unspecified: Secondary | ICD-10-CM | POA: Diagnosis not present

## 2019-06-01 DIAGNOSIS — I129 Hypertensive chronic kidney disease with stage 1 through stage 4 chronic kidney disease, or unspecified chronic kidney disease: Secondary | ICD-10-CM | POA: Diagnosis not present

## 2019-06-01 DIAGNOSIS — G301 Alzheimer's disease with late onset: Secondary | ICD-10-CM | POA: Diagnosis not present

## 2019-06-01 DIAGNOSIS — N183 Chronic kidney disease, stage 3 unspecified: Secondary | ICD-10-CM | POA: Diagnosis not present

## 2019-06-01 DIAGNOSIS — F0281 Dementia in other diseases classified elsewhere with behavioral disturbance: Secondary | ICD-10-CM | POA: Diagnosis not present

## 2019-06-01 DIAGNOSIS — D631 Anemia in chronic kidney disease: Secondary | ICD-10-CM | POA: Diagnosis not present

## 2019-06-03 DIAGNOSIS — G301 Alzheimer's disease with late onset: Secondary | ICD-10-CM | POA: Diagnosis not present

## 2019-06-03 DIAGNOSIS — N183 Chronic kidney disease, stage 3 unspecified: Secondary | ICD-10-CM | POA: Diagnosis not present

## 2019-06-03 DIAGNOSIS — F319 Bipolar disorder, unspecified: Secondary | ICD-10-CM | POA: Diagnosis not present

## 2019-06-03 DIAGNOSIS — I129 Hypertensive chronic kidney disease with stage 1 through stage 4 chronic kidney disease, or unspecified chronic kidney disease: Secondary | ICD-10-CM | POA: Diagnosis not present

## 2019-06-03 DIAGNOSIS — E538 Deficiency of other specified B group vitamins: Secondary | ICD-10-CM | POA: Diagnosis not present

## 2019-06-03 DIAGNOSIS — D631 Anemia in chronic kidney disease: Secondary | ICD-10-CM | POA: Diagnosis not present

## 2019-06-03 DIAGNOSIS — F0281 Dementia in other diseases classified elsewhere with behavioral disturbance: Secondary | ICD-10-CM | POA: Diagnosis not present

## 2019-06-03 DIAGNOSIS — E782 Mixed hyperlipidemia: Secondary | ICD-10-CM | POA: Diagnosis not present

## 2019-06-03 DIAGNOSIS — F419 Anxiety disorder, unspecified: Secondary | ICD-10-CM | POA: Diagnosis not present

## 2019-06-08 DIAGNOSIS — F0281 Dementia in other diseases classified elsewhere with behavioral disturbance: Secondary | ICD-10-CM | POA: Diagnosis not present

## 2019-06-08 DIAGNOSIS — G301 Alzheimer's disease with late onset: Secondary | ICD-10-CM | POA: Diagnosis not present

## 2019-06-08 DIAGNOSIS — E538 Deficiency of other specified B group vitamins: Secondary | ICD-10-CM | POA: Diagnosis not present

## 2019-06-08 DIAGNOSIS — I129 Hypertensive chronic kidney disease with stage 1 through stage 4 chronic kidney disease, or unspecified chronic kidney disease: Secondary | ICD-10-CM | POA: Diagnosis not present

## 2019-06-08 DIAGNOSIS — F319 Bipolar disorder, unspecified: Secondary | ICD-10-CM | POA: Diagnosis not present

## 2019-06-08 DIAGNOSIS — D631 Anemia in chronic kidney disease: Secondary | ICD-10-CM | POA: Diagnosis not present

## 2019-06-08 DIAGNOSIS — F419 Anxiety disorder, unspecified: Secondary | ICD-10-CM | POA: Diagnosis not present

## 2019-06-08 DIAGNOSIS — N183 Chronic kidney disease, stage 3 unspecified: Secondary | ICD-10-CM | POA: Diagnosis not present

## 2019-06-08 DIAGNOSIS — E782 Mixed hyperlipidemia: Secondary | ICD-10-CM | POA: Diagnosis not present

## 2019-06-10 DIAGNOSIS — I129 Hypertensive chronic kidney disease with stage 1 through stage 4 chronic kidney disease, or unspecified chronic kidney disease: Secondary | ICD-10-CM | POA: Diagnosis not present

## 2019-06-10 DIAGNOSIS — E538 Deficiency of other specified B group vitamins: Secondary | ICD-10-CM | POA: Diagnosis not present

## 2019-06-10 DIAGNOSIS — G301 Alzheimer's disease with late onset: Secondary | ICD-10-CM | POA: Diagnosis not present

## 2019-06-10 DIAGNOSIS — F0281 Dementia in other diseases classified elsewhere with behavioral disturbance: Secondary | ICD-10-CM | POA: Diagnosis not present

## 2019-06-10 DIAGNOSIS — N183 Chronic kidney disease, stage 3 unspecified: Secondary | ICD-10-CM | POA: Diagnosis not present

## 2019-06-10 DIAGNOSIS — F419 Anxiety disorder, unspecified: Secondary | ICD-10-CM | POA: Diagnosis not present

## 2019-06-10 DIAGNOSIS — D631 Anemia in chronic kidney disease: Secondary | ICD-10-CM | POA: Diagnosis not present

## 2019-06-10 DIAGNOSIS — F319 Bipolar disorder, unspecified: Secondary | ICD-10-CM | POA: Diagnosis not present

## 2019-06-17 DIAGNOSIS — F0281 Dementia in other diseases classified elsewhere with behavioral disturbance: Secondary | ICD-10-CM | POA: Diagnosis not present

## 2019-06-17 DIAGNOSIS — E782 Mixed hyperlipidemia: Secondary | ICD-10-CM | POA: Diagnosis not present

## 2019-06-17 DIAGNOSIS — F419 Anxiety disorder, unspecified: Secondary | ICD-10-CM | POA: Diagnosis not present

## 2019-06-17 DIAGNOSIS — G301 Alzheimer's disease with late onset: Secondary | ICD-10-CM | POA: Diagnosis not present

## 2019-06-17 DIAGNOSIS — D631 Anemia in chronic kidney disease: Secondary | ICD-10-CM | POA: Diagnosis not present

## 2019-06-17 DIAGNOSIS — E538 Deficiency of other specified B group vitamins: Secondary | ICD-10-CM | POA: Diagnosis not present

## 2019-06-17 DIAGNOSIS — F319 Bipolar disorder, unspecified: Secondary | ICD-10-CM | POA: Diagnosis not present

## 2019-06-17 DIAGNOSIS — N183 Chronic kidney disease, stage 3 unspecified: Secondary | ICD-10-CM | POA: Diagnosis not present

## 2019-06-17 DIAGNOSIS — I129 Hypertensive chronic kidney disease with stage 1 through stage 4 chronic kidney disease, or unspecified chronic kidney disease: Secondary | ICD-10-CM | POA: Diagnosis not present

## 2019-06-18 DIAGNOSIS — G301 Alzheimer's disease with late onset: Secondary | ICD-10-CM | POA: Diagnosis not present

## 2019-06-18 DIAGNOSIS — I129 Hypertensive chronic kidney disease with stage 1 through stage 4 chronic kidney disease, or unspecified chronic kidney disease: Secondary | ICD-10-CM | POA: Diagnosis not present

## 2019-06-18 DIAGNOSIS — F319 Bipolar disorder, unspecified: Secondary | ICD-10-CM | POA: Diagnosis not present

## 2019-06-18 DIAGNOSIS — E782 Mixed hyperlipidemia: Secondary | ICD-10-CM | POA: Diagnosis not present

## 2019-06-18 DIAGNOSIS — N183 Chronic kidney disease, stage 3 unspecified: Secondary | ICD-10-CM | POA: Diagnosis not present

## 2019-06-18 DIAGNOSIS — E538 Deficiency of other specified B group vitamins: Secondary | ICD-10-CM | POA: Diagnosis not present

## 2019-06-18 DIAGNOSIS — F419 Anxiety disorder, unspecified: Secondary | ICD-10-CM | POA: Diagnosis not present

## 2019-06-18 DIAGNOSIS — D631 Anemia in chronic kidney disease: Secondary | ICD-10-CM | POA: Diagnosis not present

## 2019-06-18 DIAGNOSIS — F0281 Dementia in other diseases classified elsewhere with behavioral disturbance: Secondary | ICD-10-CM | POA: Diagnosis not present

## 2019-06-22 DIAGNOSIS — Z20828 Contact with and (suspected) exposure to other viral communicable diseases: Secondary | ICD-10-CM | POA: Diagnosis not present

## 2019-06-22 DIAGNOSIS — Z1159 Encounter for screening for other viral diseases: Secondary | ICD-10-CM | POA: Diagnosis not present

## 2019-06-23 DIAGNOSIS — N183 Chronic kidney disease, stage 3 unspecified: Secondary | ICD-10-CM | POA: Diagnosis not present

## 2019-06-23 DIAGNOSIS — F0281 Dementia in other diseases classified elsewhere with behavioral disturbance: Secondary | ICD-10-CM | POA: Diagnosis not present

## 2019-06-23 DIAGNOSIS — F419 Anxiety disorder, unspecified: Secondary | ICD-10-CM | POA: Diagnosis not present

## 2019-06-23 DIAGNOSIS — E538 Deficiency of other specified B group vitamins: Secondary | ICD-10-CM | POA: Diagnosis not present

## 2019-06-23 DIAGNOSIS — G301 Alzheimer's disease with late onset: Secondary | ICD-10-CM | POA: Diagnosis not present

## 2019-06-23 DIAGNOSIS — I129 Hypertensive chronic kidney disease with stage 1 through stage 4 chronic kidney disease, or unspecified chronic kidney disease: Secondary | ICD-10-CM | POA: Diagnosis not present

## 2019-06-23 DIAGNOSIS — F319 Bipolar disorder, unspecified: Secondary | ICD-10-CM | POA: Diagnosis not present

## 2019-06-23 DIAGNOSIS — D631 Anemia in chronic kidney disease: Secondary | ICD-10-CM | POA: Diagnosis not present

## 2019-06-23 DIAGNOSIS — E782 Mixed hyperlipidemia: Secondary | ICD-10-CM | POA: Diagnosis not present

## 2019-06-28 DIAGNOSIS — N183 Chronic kidney disease, stage 3 unspecified: Secondary | ICD-10-CM | POA: Diagnosis not present

## 2019-06-28 DIAGNOSIS — E538 Deficiency of other specified B group vitamins: Secondary | ICD-10-CM | POA: Diagnosis not present

## 2019-06-28 DIAGNOSIS — G301 Alzheimer's disease with late onset: Secondary | ICD-10-CM | POA: Diagnosis not present

## 2019-06-28 DIAGNOSIS — F0281 Dementia in other diseases classified elsewhere with behavioral disturbance: Secondary | ICD-10-CM | POA: Diagnosis not present

## 2019-06-28 DIAGNOSIS — I129 Hypertensive chronic kidney disease with stage 1 through stage 4 chronic kidney disease, or unspecified chronic kidney disease: Secondary | ICD-10-CM | POA: Diagnosis not present

## 2019-06-28 DIAGNOSIS — F419 Anxiety disorder, unspecified: Secondary | ICD-10-CM | POA: Diagnosis not present

## 2019-06-28 DIAGNOSIS — F319 Bipolar disorder, unspecified: Secondary | ICD-10-CM | POA: Diagnosis not present

## 2019-06-28 DIAGNOSIS — E782 Mixed hyperlipidemia: Secondary | ICD-10-CM | POA: Diagnosis not present

## 2019-06-28 DIAGNOSIS — D631 Anemia in chronic kidney disease: Secondary | ICD-10-CM | POA: Diagnosis not present

## 2019-06-30 DIAGNOSIS — Z1159 Encounter for screening for other viral diseases: Secondary | ICD-10-CM | POA: Diagnosis not present

## 2019-06-30 DIAGNOSIS — Z20828 Contact with and (suspected) exposure to other viral communicable diseases: Secondary | ICD-10-CM | POA: Diagnosis not present

## 2019-07-05 DIAGNOSIS — F0281 Dementia in other diseases classified elsewhere with behavioral disturbance: Secondary | ICD-10-CM | POA: Diagnosis not present

## 2019-07-05 DIAGNOSIS — F419 Anxiety disorder, unspecified: Secondary | ICD-10-CM | POA: Diagnosis not present

## 2019-07-05 DIAGNOSIS — I129 Hypertensive chronic kidney disease with stage 1 through stage 4 chronic kidney disease, or unspecified chronic kidney disease: Secondary | ICD-10-CM | POA: Diagnosis not present

## 2019-07-05 DIAGNOSIS — N183 Chronic kidney disease, stage 3 unspecified: Secondary | ICD-10-CM | POA: Diagnosis not present

## 2019-07-05 DIAGNOSIS — E538 Deficiency of other specified B group vitamins: Secondary | ICD-10-CM | POA: Diagnosis not present

## 2019-07-05 DIAGNOSIS — F319 Bipolar disorder, unspecified: Secondary | ICD-10-CM | POA: Diagnosis not present

## 2019-07-05 DIAGNOSIS — E782 Mixed hyperlipidemia: Secondary | ICD-10-CM | POA: Diagnosis not present

## 2019-07-05 DIAGNOSIS — G301 Alzheimer's disease with late onset: Secondary | ICD-10-CM | POA: Diagnosis not present

## 2019-07-05 DIAGNOSIS — D631 Anemia in chronic kidney disease: Secondary | ICD-10-CM | POA: Diagnosis not present

## 2019-07-08 DIAGNOSIS — G301 Alzheimer's disease with late onset: Secondary | ICD-10-CM | POA: Diagnosis not present

## 2019-07-08 DIAGNOSIS — D631 Anemia in chronic kidney disease: Secondary | ICD-10-CM | POA: Diagnosis not present

## 2019-07-08 DIAGNOSIS — F0281 Dementia in other diseases classified elsewhere with behavioral disturbance: Secondary | ICD-10-CM | POA: Diagnosis not present

## 2019-07-08 DIAGNOSIS — E782 Mixed hyperlipidemia: Secondary | ICD-10-CM | POA: Diagnosis not present

## 2019-07-08 DIAGNOSIS — N183 Chronic kidney disease, stage 3 unspecified: Secondary | ICD-10-CM | POA: Diagnosis not present

## 2019-07-08 DIAGNOSIS — E538 Deficiency of other specified B group vitamins: Secondary | ICD-10-CM | POA: Diagnosis not present

## 2019-07-08 DIAGNOSIS — F319 Bipolar disorder, unspecified: Secondary | ICD-10-CM | POA: Diagnosis not present

## 2019-07-08 DIAGNOSIS — I129 Hypertensive chronic kidney disease with stage 1 through stage 4 chronic kidney disease, or unspecified chronic kidney disease: Secondary | ICD-10-CM | POA: Diagnosis not present

## 2019-07-08 DIAGNOSIS — F419 Anxiety disorder, unspecified: Secondary | ICD-10-CM | POA: Diagnosis not present

## 2019-08-04 DIAGNOSIS — Z20828 Contact with and (suspected) exposure to other viral communicable diseases: Secondary | ICD-10-CM | POA: Diagnosis not present

## 2019-08-10 DIAGNOSIS — Z20828 Contact with and (suspected) exposure to other viral communicable diseases: Secondary | ICD-10-CM | POA: Diagnosis not present

## 2019-08-17 ENCOUNTER — Emergency Department: Payer: Medicare HMO

## 2019-08-17 ENCOUNTER — Encounter: Payer: Self-pay | Admitting: Emergency Medicine

## 2019-08-17 ENCOUNTER — Inpatient Hospital Stay
Admission: EM | Admit: 2019-08-17 | Discharge: 2019-08-18 | DRG: 177 | Disposition: A | Discharge status: Other Acute Inpatient Hospital | Payer: Medicare HMO | Attending: Internal Medicine | Admitting: Internal Medicine

## 2019-08-17 ENCOUNTER — Other Ambulatory Visit: Payer: Self-pay

## 2019-08-17 DIAGNOSIS — R8290 Unspecified abnormal findings in urine: Secondary | ICD-10-CM | POA: Diagnosis not present

## 2019-08-17 DIAGNOSIS — I129 Hypertensive chronic kidney disease with stage 1 through stage 4 chronic kidney disease, or unspecified chronic kidney disease: Secondary | ICD-10-CM | POA: Diagnosis present

## 2019-08-17 DIAGNOSIS — I10 Essential (primary) hypertension: Secondary | ICD-10-CM | POA: Diagnosis present

## 2019-08-17 DIAGNOSIS — D509 Iron deficiency anemia, unspecified: Secondary | ICD-10-CM | POA: Diagnosis not present

## 2019-08-17 DIAGNOSIS — N183 Chronic kidney disease, stage 3 unspecified: Secondary | ICD-10-CM | POA: Diagnosis not present

## 2019-08-17 DIAGNOSIS — F419 Anxiety disorder, unspecified: Secondary | ICD-10-CM | POA: Diagnosis present

## 2019-08-17 DIAGNOSIS — F0281 Dementia in other diseases classified elsewhere with behavioral disturbance: Secondary | ICD-10-CM | POA: Diagnosis not present

## 2019-08-17 DIAGNOSIS — Z886 Allergy status to analgesic agent status: Secondary | ICD-10-CM | POA: Diagnosis not present

## 2019-08-17 DIAGNOSIS — G9341 Metabolic encephalopathy: Secondary | ICD-10-CM | POA: Diagnosis not present

## 2019-08-17 DIAGNOSIS — N179 Acute kidney failure, unspecified: Secondary | ICD-10-CM | POA: Diagnosis present

## 2019-08-17 DIAGNOSIS — E538 Deficiency of other specified B group vitamins: Secondary | ICD-10-CM | POA: Diagnosis present

## 2019-08-17 DIAGNOSIS — E860 Dehydration: Secondary | ICD-10-CM | POA: Diagnosis present

## 2019-08-17 DIAGNOSIS — D649 Anemia, unspecified: Secondary | ICD-10-CM | POA: Diagnosis not present

## 2019-08-17 DIAGNOSIS — J1282 Pneumonia due to coronavirus disease 2019: Secondary | ICD-10-CM | POA: Diagnosis present

## 2019-08-17 DIAGNOSIS — J984 Other disorders of lung: Secondary | ICD-10-CM | POA: Diagnosis not present

## 2019-08-17 DIAGNOSIS — E785 Hyperlipidemia, unspecified: Secondary | ICD-10-CM | POA: Diagnosis present

## 2019-08-17 DIAGNOSIS — Z66 Do not resuscitate: Secondary | ICD-10-CM | POA: Diagnosis present

## 2019-08-17 DIAGNOSIS — F319 Bipolar disorder, unspecified: Secondary | ICD-10-CM | POA: Diagnosis present

## 2019-08-17 DIAGNOSIS — N3000 Acute cystitis without hematuria: Secondary | ICD-10-CM | POA: Diagnosis present

## 2019-08-17 DIAGNOSIS — F0280 Dementia in other diseases classified elsewhere without behavioral disturbance: Secondary | ICD-10-CM | POA: Diagnosis not present

## 2019-08-17 DIAGNOSIS — Z79899 Other long term (current) drug therapy: Secondary | ICD-10-CM

## 2019-08-17 DIAGNOSIS — R413 Other amnesia: Secondary | ICD-10-CM | POA: Diagnosis not present

## 2019-08-17 DIAGNOSIS — R404 Transient alteration of awareness: Secondary | ICD-10-CM | POA: Diagnosis not present

## 2019-08-17 DIAGNOSIS — U071 COVID-19: Secondary | ICD-10-CM | POA: Diagnosis not present

## 2019-08-17 DIAGNOSIS — E782 Mixed hyperlipidemia: Secondary | ICD-10-CM | POA: Diagnosis not present

## 2019-08-17 DIAGNOSIS — G309 Alzheimer's disease, unspecified: Secondary | ICD-10-CM | POA: Diagnosis present

## 2019-08-17 DIAGNOSIS — R296 Repeated falls: Secondary | ICD-10-CM | POA: Diagnosis present

## 2019-08-17 DIAGNOSIS — R4182 Altered mental status, unspecified: Secondary | ICD-10-CM | POA: Diagnosis not present

## 2019-08-17 DIAGNOSIS — N1831 Chronic kidney disease, stage 3a: Secondary | ICD-10-CM | POA: Diagnosis not present

## 2019-08-17 DIAGNOSIS — Z87891 Personal history of nicotine dependence: Secondary | ICD-10-CM | POA: Diagnosis not present

## 2019-08-17 DIAGNOSIS — J069 Acute upper respiratory infection, unspecified: Secondary | ICD-10-CM | POA: Diagnosis present

## 2019-08-17 DIAGNOSIS — W19XXXA Unspecified fall, initial encounter: Secondary | ICD-10-CM | POA: Diagnosis present

## 2019-08-17 DIAGNOSIS — F418 Other specified anxiety disorders: Secondary | ICD-10-CM | POA: Diagnosis present

## 2019-08-17 DIAGNOSIS — Z888 Allergy status to other drugs, medicaments and biological substances status: Secondary | ICD-10-CM | POA: Diagnosis not present

## 2019-08-17 DIAGNOSIS — E870 Hyperosmolality and hypernatremia: Secondary | ICD-10-CM | POA: Diagnosis present

## 2019-08-17 DIAGNOSIS — G301 Alzheimer's disease with late onset: Secondary | ICD-10-CM

## 2019-08-17 DIAGNOSIS — I959 Hypotension, unspecified: Secondary | ICD-10-CM | POA: Diagnosis not present

## 2019-08-17 DIAGNOSIS — R0902 Hypoxemia: Secondary | ICD-10-CM | POA: Diagnosis not present

## 2019-08-17 LAB — TRIGLYCERIDES: Triglycerides: 104 mg/dL (ref <150)

## 2019-08-17 LAB — COMPREHENSIVE METABOLIC PANEL
ALT: 16 U/L (ref 0–44)
AST: 26 U/L (ref 15–41)
Albumin: 2.9 g/dL — ABNORMAL LOW (ref 3.5–5.0)
Alkaline Phosphatase: 79 U/L (ref 38–126)
Anion gap: 13 (ref 5–15)
BUN: 62 mg/dL — ABNORMAL HIGH (ref 8–23)
CO2: 20 mmol/L — ABNORMAL LOW (ref 22–32)
Calcium: 8.3 mg/dL — ABNORMAL LOW (ref 8.9–10.3)
Chloride: 114 mmol/L — ABNORMAL HIGH (ref 98–111)
Creatinine, Ser: 3.09 mg/dL — ABNORMAL HIGH (ref 0.44–1.00)
GFR calc Af Amer: 15 mL/min — ABNORMAL LOW (ref >60)
GFR calc non Af Amer: 13 mL/min — ABNORMAL LOW (ref >60)
Glucose, Bld: 121 mg/dL — ABNORMAL HIGH (ref 70–99)
Potassium: 4.8 mmol/L (ref 3.5–5.1)
Sodium: 147 mmol/L — ABNORMAL HIGH (ref 135–145)
Total Bilirubin: 0.7 mg/dL (ref 0.3–1.2)
Total Protein: 7.9 g/dL (ref 6.5–8.1)

## 2019-08-17 LAB — URINALYSIS, COMPLETE (UACMP) WITH MICROSCOPIC
Bilirubin Urine: NEGATIVE
Glucose, UA: NEGATIVE mg/dL
Hgb urine dipstick: NEGATIVE
Ketones, ur: NEGATIVE mg/dL
Nitrite: POSITIVE — AB
Protein, ur: 30 mg/dL — AB
Specific Gravity, Urine: 1.015 (ref 1.005–1.030)
pH: 5 (ref 5.0–8.0)

## 2019-08-17 LAB — CBC
HCT: 36.4 % (ref 36.0–46.0)
Hemoglobin: 11.4 g/dL — ABNORMAL LOW (ref 12.0–15.0)
MCH: 30.8 pg (ref 26.0–34.0)
MCHC: 31.3 g/dL (ref 30.0–36.0)
MCV: 98.4 fL (ref 80.0–100.0)
Platelets: 176 10*3/uL (ref 150–400)
RBC: 3.7 MIL/uL — ABNORMAL LOW (ref 3.87–5.11)
RDW: 13.8 % (ref 11.5–15.5)
WBC: 7 10*3/uL (ref 4.0–10.5)
nRBC: 0 % (ref 0.0–0.2)

## 2019-08-17 LAB — FERRITIN: Ferritin: 863 ng/mL — ABNORMAL HIGH (ref 11–307)

## 2019-08-17 LAB — LIPASE, BLOOD: Lipase: 33 U/L (ref 11–51)

## 2019-08-17 LAB — FIBRIN DERIVATIVES D-DIMER (ARMC ONLY): Fibrin derivatives D-dimer (ARMC): 1445.28 ng/mL (FEU) — ABNORMAL HIGH (ref 0.00–499.00)

## 2019-08-17 LAB — TROPONIN I (HIGH SENSITIVITY)
Troponin I (High Sensitivity): 23 ng/L — ABNORMAL HIGH (ref <18)
Troponin I (High Sensitivity): 23 ng/L — ABNORMAL HIGH (ref <18)

## 2019-08-17 LAB — PROCALCITONIN
Procalcitonin: 1.38 ng/mL
Procalcitonin: 0.68 ng/mL

## 2019-08-17 LAB — FIBRINOGEN: Fibrinogen: >750 mg/dL — ABNORMAL HIGH (ref 210–475)

## 2019-08-17 LAB — LACTATE DEHYDROGENASE: LDH: 276 U/L — ABNORMAL HIGH (ref 98–192)

## 2019-08-17 LAB — POC SARS CORONAVIRUS 2 AG -  ED: SARS Coronavirus 2 Ag: POSITIVE — AB

## 2019-08-17 LAB — BRAIN NATRIURETIC PEPTIDE: B Natriuretic Peptide: 39 pg/mL (ref 0.0–100.0)

## 2019-08-17 MED ORDER — IPRATROPIUM BROMIDE HFA 17 MCG/ACT IN AERS
2.0000 | INHALATION_SPRAY | RESPIRATORY_TRACT | Status: DC
  Administered 2019-08-17: 2 via RESPIRATORY_TRACT
  Filled 2019-08-17: qty 12.9

## 2019-08-17 MED ORDER — SODIUM CHLORIDE 0.9 % IV BOLUS
1000.0000 mL | Freq: Once | INTRAVENOUS | Status: AC
  Administered 2019-08-17: 13:00:00 1000 mL via INTRAVENOUS

## 2019-08-17 MED ORDER — ZINC SULFATE 220 (50 ZN) MG PO CAPS: 220.0000 mg | ORAL_CAPSULE | Freq: Every day | ORAL | Status: DC

## 2019-08-17 MED ORDER — DEXTROSE-NACL 5-0.45 % IV SOLN: INTRAVENOUS | Status: DC

## 2019-08-17 MED ORDER — SODIUM CHLORIDE 0.9 % IV SOLN
200.0000 mg | Freq: Once | INTRAVENOUS | Status: AC
  Administered 2019-08-17: 15:00:00 200 mg via INTRAVENOUS
  Filled 2019-08-17: qty 200

## 2019-08-17 MED ORDER — METHYLPREDNISOLONE SODIUM SUCC 125 MG IJ SOLR
INTRAMUSCULAR | Status: AC
  Filled 2019-08-17: qty 2

## 2019-08-17 MED ORDER — METHYLPREDNISOLONE SODIUM SUCC 40 MG IJ SOLR
30.0000 mg | Freq: Two times a day (BID) | INTRAMUSCULAR | Status: DC
  Administered 2019-08-17: 22:00:00 30 mg via INTRAVENOUS
  Filled 2019-08-17: qty 1

## 2019-08-17 MED ORDER — DM-GUAIFENESIN ER 30-600 MG PO TB12: 1.0000 | ORAL_TABLET | Freq: Two times a day (BID) | ORAL | Status: DC

## 2019-08-17 MED ORDER — SODIUM CHLORIDE 0.9 % IV SOLN
100.0000 mg | Freq: Every day | INTRAVENOUS | Status: DC
  Filled 2019-08-17: qty 20

## 2019-08-17 MED ORDER — ASCORBIC ACID 500 MG PO TABS: 500.0000 mg | ORAL_TABLET | Freq: Every day | ORAL | Status: DC

## 2019-08-17 MED ORDER — ACETAMINOPHEN 325 MG PO TABS: 650.0000 mg | ORAL_TABLET | Freq: Four times a day (QID) | ORAL | Status: DC | PRN

## 2019-08-17 MED ORDER — HEPARIN SODIUM (PORCINE) 5000 UNIT/ML IJ SOLN
5000.0000 [IU] | Freq: Three times a day (TID) | INTRAMUSCULAR | Status: DC
  Administered 2019-08-17: 22:00:00 5000 [IU] via SUBCUTANEOUS
  Filled 2019-08-17: qty 1

## 2019-08-17 MED ORDER — ALBUTEROL SULFATE HFA 108 (90 BASE) MCG/ACT IN AERS
2.0000 | INHALATION_SPRAY | RESPIRATORY_TRACT | Status: DC | PRN
  Filled 2019-08-17: qty 6.7

## 2019-08-17 NOTE — ED Triage Notes (Signed)
Pt arrives from [facility] via [facility] with c/o altered mental status. Facility reported pt has been altered for 2 days. Pt normally ambulatory, pt would stand for ems but would not ambulate. Facility reported pt not eating/drinking normally. Blood sugar 140. This RN attempted to contact legal guardian, [name], with no answer. Pt with mumbled speech at this time. Pt pointing to throat, when this RN asked if pt throat hurt, pt nodded head yes.

## 2019-08-17 NOTE — Assessment & Plan Note (Signed)
Admit to medical bed. Continue IV Remdesivir and po decadron. RT for assessment.

## 2019-08-17 NOTE — ED Provider Notes (Signed)
[facility] Emergency Department Provider Note  ____________________________________________  Time seen: Approximately 12:46 PM  I have reviewed the triage vital signs and the nursing notes.   HISTORY  Chief Complaint Altered Mental Status    Level 5 Caveat: Portions of the History and Physical including HPI and review of systems are unable to be completely obtained due to patient being a poor historian   HPI Leah Waller is a 85 y.o. female with history of dementia hypertension CKD sent to the ED from [place] due to decreased level of alertness, fatigue.  Normally is ambulatory and conversant, however at bedside notes that patient has been confused and somnolent for the past 2 days, unable to stand or ambulate.  Not eating.  Family member also notes that the patient has had multiple falls over the last week or 2.  They note that she appears to stop moving her left leg since yesterday.  Patient indicates some throat discomfort but unable to describe any other specific symptoms.  Denies chest pain or shortness of breath.      Past Medical History:  Diagnosis Date  . Anxiety   . B12 deficiency   . Bipolar disorder ([place])   . Dementia ([place])   . Hypertension      Patient Active Problem List   Diagnosis Date Noted  . Memory deficit 04/14/2018  . Hypertension 04/14/2018  . Hyperlipidemia 04/14/2018  . CKD (chronic kidney disease) 04/14/2018  . Anxiety 04/14/2018  . Alzheimer's dementia with behavioral disturbance ([place]) 12/22/2017  . Low vitamin B12 level 10/11/2014     History reviewed. No pertinent surgical history.   Prior to Admission medications   Medication Sig Start Date End Date Taking? Authorizing Provider  acetaminophen (TYLENOL) 500 MG tablet Take 500 mg by mouth every 4 (four) hours as needed for mild pain or fever.   Yes [provider]  alum & mag hydroxide-simeth (ALUMINA-MAGNESIA-SIMETHICONE) 200-200-20  MG/5ML suspension Take 30 mLs by mouth every 6 (six) hours as needed for indigestion or heartburn.   Yes [provider]  amLODipine (NORVASC) 2.5 MG tablet Take 2.5 mg by mouth daily.   Yes [provider]  cyanocobalamin ([employer] VITAMIN B12) 1000 MCG tablet Take 1,000 mcg by mouth daily.  03/17/18  Yes [provider]  donepezil (ARICEPT) 5 MG tablet Take 5 mg by mouth at bedtime.   Yes [provider]  ferrous sulfate 325 (65 FE) MG tablet Take 325 mg by mouth daily with breakfast.   Yes [provider]  guaiFENesin (ROBITUSSIN) 100 MG/5ML liquid Take 200 mg by mouth every 6 (six) hours as needed for cough.   Yes [provider]  loperamide (IMODIUM) 2 MG capsule Take 2 mg by mouth as needed for diarrhea or loose stools.   Yes [provider]  LORazepam (ATIVAN) 0.5 MG tablet Take 1 tablet (0.5 mg total) by mouth 2 (two) times daily as needed for anxiety. 10/12/18  Yes Ursula Alert, MD  losartan (COZAAR) 50 MG tablet Take 50 mg by mouth daily.   Yes [provider]  magnesium hydroxide (MILK OF MAGNESIA) 400 MG/5ML suspension Take 30 mLs by mouth at bedtime as needed for mild constipation.   Yes [provider]  mirtazapine (REMERON) 7.5 MG tablet Take 1 tablet (7.5 mg total) by mouth at bedtime. For sleep 10/12/18  Yes Ursula Alert, MD  neomycin-bacitracin-polymyxin (NEOSPORIN) [phone] ointment Apply 1 application topically daily as needed (skin tears).   Yes  [provider]  risperiDONE (RISPERDAL) 0.5 MG tablet Take 1 tablet (0.5 mg total) by mouth daily as needed (agitation). 10/12/18  Yes Ursula Alert, MD  risperiDONE (RISPERDAL) 0.5 MG tablet Take 1-1.5 tablets (0.5-0.75 mg total) by mouth as directed. Take 1 tablet in the AM and 1.5 tablets PM 10/12/18 08/17/19 Yes Ursula Alert, MD     Allergies Aspirin and Lisinopril   Family History  Family history unknown: Yes    Social  History Social History   Tobacco Use  . Smoking status: Former Research scientist (life sciences)  . Smokeless tobacco: Never Used  Substance Use Topics  . Alcohol use: Not Currently  . Drug use: Not Currently    Review of Systems Level 5 Caveat: Portions of the History and Physical including HPI and review of systems are unable to be completely obtained due to patient being a poor historian   Constitutional:   No known fever.  ENT:   No rhinorrhea.  Positive throat pain Cardiovascular:   No chest pain or syncope. Respiratory:   No dyspnea or cough. Gastrointestinal:   Negative for abdominal pain, vomiting and diarrhea.  Musculoskeletal:   Negative for focal pain or swelling ____________________________________________   PHYSICAL EXAM:  VITAL SIGNS: ED Triage Vitals  Enc Vitals Group     BP 08/17/19 1200 (!) 128/53     Pulse Rate 08/17/19 1200 70     Resp 08/17/19 1200 (!) 25     Temp 08/17/19 1201 98.3 F (36.8 C)     Temp Source 08/17/19 1201 Oral     SpO2 08/17/19 1200 94 %     Weight 08/17/19 1212 149 lb 14.6 oz (68 kg)     Height --      Head Circumference --      Peak Flow --      Pain Score --      Pain Loc --      Pain Edu? --      Excl. in [place]? --     Vital signs reviewed, nursing assessments reviewed.   Constitutional:   Awake, not alert or oriented.  Ill-appearing. Eyes:   Conjunctivae are normal. EOMI. PERRL. ENT      Head:   Normocephalic and atraumatic.      Nose:   No congestion/rhinnorhea.       Mouth/Throat:   Dry mucous membranes, no pharyngeal erythema. No peritonsillar mass.       Neck:   No meningismus. Full ROM. Hematological/Lymphatic/Immunilogical:   No cervical lymphadenopathy. Cardiovascular:   RRR. Symmetric bilateral radial and DP pulses.  No murmurs. Cap refill 4 seconds. Respiratory:   Normal respiratory effort.  Tachypnea.  Coarse breath sounds diffusely with slight expiratory wheezing.  Symmetric air movement. Gastrointestinal:   Soft and nontender. Non  distended. There is no CVA tenderness.  No rebound, rigidity, or guarding.  Musculoskeletal:   Normal range of motion in all extremities. No joint effusions.  Mild bilateral hip tenderness.  No pain with passive range of motion of the hips.  No lower extremity edema.  Long bones stable, joint stable. Neurologic:   Nonverbal Right upper extremity no drift, left upper extremity moderate drift Bilateral lower extremities untestable due to inability to participate/pronounced weakness..  Motor grossly intact. GCS = E3V2M6 = 11 Skin:    Skin is warm, dry and intact. No rash noted.  No petechiae, purpura, or bullae.  ____________________________________________    LABS (pertinent positives/negatives) (all labs ordered are listed, but only abnormal results are  displayed) Labs Reviewed  COMPREHENSIVE METABOLIC PANEL - Abnormal; Notable for the following components:      Result Value   Sodium 147 (*)    Chloride 114 (*)    CO2 20 (*)    Glucose, Bld 121 (*)    BUN 62 (*)    Creatinine, Ser 3.09 (*)    Calcium 8.3 (*)    Albumin 2.9 (*)    GFR calc non Af Amer 13 (*)    GFR calc Af Amer 15 (*)    All other components within normal limits  CBC - Abnormal; Notable for the following components:   RBC 3.70 (*)    Hemoglobin 11.4 (*)    All other components within normal limits  URINE CULTURE  LIPASE, BLOOD  URINALYSIS, COMPLETE (UACMP) WITH MICROSCOPIC  CBG MONITORING, ED  POC SARS CORONAVIRUS 2 AG -  ED   ____________________________________________   EKG  Interpreted by me Sinus rhythm rate of 74, normal axis and intervals.  Poor R wave progression.  Normal ST segments and T waves.  ____________________________________________    RADIOLOGY  CT HEAD WO CONTRAST  Result Date: 08/17/2019 CLINICAL DATA:  Altered mental status for 2 days. EXAM: CT HEAD WITHOUT CONTRAST CT CERVICAL SPINE WITHOUT CONTRAST TECHNIQUE: Multidetector CT imaging of the head and cervical spine was  performed following the standard protocol without intravenous contrast. Multiplanar CT image reconstructions of the cervical spine were also generated. COMPARISON:  Cervical spine MRI 09/19/2008. Head CT scan 04/30/2019. FINDINGS: CT HEAD FINDINGS Brain: No evidence of acute infarction, hemorrhage, hydrocephalus, extra-axial collection or mass lesion/mass effect. Mild chronic microvascular ischemic change again seen. Vascular: Atherosclerosis. Skull: Intact.  No focal lesion. Sinuses/Orbits: Status post cataract surgery on the right. The patient is also status post right maxillary antrostomy. Other: None. Exaggeration of the normal cervical lordosis. 0.3 cm anterolisthesis C4 on C5 due to facet arthropathy. CT CERVICAL SPINE FINDINGS Alignment: There is some exaggeration of the normal cervical lordosis. 0.3 cm anterolisthesis C4 on C5 due to facet arthropathy. Skull base and vertebrae: No acute fracture. No primary bone lesion or focal pathologic process. Soft tissues and spinal canal: No prevertebral fluid or swelling. No visible canal hematoma. Disc levels: There is some loss of disc space height at C4-5. Small disc bulges are seen at C5-6 and C6-7. Upper chest: Lung apices clear. Other: None. IMPRESSION: No acute abnormality head or cervical spine. Chronic microvascular change. Mild cervical spondylosis. Electronically Signed   By: Inge Rise M.D.   On: 08/17/2019 13:27   CT CERVICAL SPINE WO CONTRAST  Result Date: 08/17/2019 CLINICAL DATA:  Altered mental status for 2 days. EXAM: CT HEAD WITHOUT CONTRAST CT CERVICAL SPINE WITHOUT CONTRAST TECHNIQUE: Multidetector CT imaging of the head and cervical spine was performed following the standard protocol without intravenous contrast. Multiplanar CT image reconstructions of the cervical spine were also generated. COMPARISON:  Cervical spine MRI 09/19/2008. Head CT scan 04/30/2019. FINDINGS: CT HEAD FINDINGS Brain: No evidence of acute infarction, hemorrhage,  hydrocephalus, extra-axial collection or mass lesion/mass effect. Mild chronic microvascular ischemic change again seen. Vascular: Atherosclerosis. Skull: Intact.  No focal lesion. Sinuses/Orbits: Status post cataract surgery on the right. The patient is also status post right maxillary antrostomy. Other: None. Exaggeration of the normal cervical lordosis. 0.3 cm anterolisthesis C4 on C5 due to facet arthropathy. CT CERVICAL SPINE FINDINGS Alignment: There is some exaggeration of the normal cervical lordosis. 0.3 cm anterolisthesis C4 on C5 due to facet arthropathy. Skull base  and vertebrae: No acute fracture. No primary bone lesion or focal pathologic process. Soft tissues and spinal canal: No prevertebral fluid or swelling. No visible canal hematoma. Disc levels: There is some loss of disc space height at C4-5. Small disc bulges are seen at C5-6 and C6-7. Upper chest: Lung apices clear. Other: None. IMPRESSION: No acute abnormality head or cervical spine. Chronic microvascular change. Mild cervical spondylosis. Electronically Signed   By: Inge Rise M.D.   On: 08/17/2019 13:27   DG Chest Portable 1 View  Result Date: 08/17/2019 CLINICAL DATA:  Altered mental status for 2 days. EXAM: PORTABLE CHEST 1 VIEW COMPARISON:  Single-view of the chest 07/08/2009. FINDINGS: Hazy airspace disease is seen bilaterally, most notable in the upper lung zones and left lung base. No pneumothorax or pleural effusion. Heart size is mildly enlarged. No acute or focal bony abnormality. IMPRESSION: Hazy bilateral airspace disease has an appearance most worrisome for pneumonia. Electronically Signed   By: Inge Rise M.D.   On: 08/17/2019 13:16   DG Hip Unilat W or Wo Pelvis 2-3 Views Left  Result Date: 08/17/2019 CLINICAL DATA:  Altered mental status. The patient refuses to ambulate. No known injury. EXAM: DG HIP (WITH OR WITHOUT PELVIS) 2-3V LEFT COMPARISON:  None. FINDINGS: There is no evidence of hip fracture or  dislocation. There is no evidence of arthropathy or other focal bone abnormality. IMPRESSION: Negative exam. Electronically Signed   By: Inge Rise M.D.   On: 08/17/2019 13:15    ____________________________________________   PROCEDURES Procedures  ____________________________________________  DIFFERENTIAL DIAGNOSIS   Intracranial hemorrhage, stroke, cervical spine injury, dehydration, electrolyte abnormality, COVID-19, pneumonia, hip fracture, UTI  CLINICAL IMPRESSION / ASSESSMENT AND PLAN / ED COURSE  Medications ordered in the ED: Medications  sodium chloride 0.9 % bolus 1,000 mL (1,000 mLs Intravenous New Bag/Given 08/17/19 1255)    Pertinent labs & imaging results that were available during my care of the patient were reviewed by me and considered in my medical decision making (see chart for details).   Leah Waller was evaluated in Emergency Department on 08/17/2019 for the symptoms described in the history of present illness. She was evaluated in the context of the global COVID-19 pandemic, which necessitated consideration that the patient might be at risk for infection with the SARS-CoV-2 virus that causes COVID-19. Institutional protocols and algorithms that pertain to the evaluation of patients at risk for COVID-19 are in a state of rapid change based on information released by regulatory bodies including the CDC and federal and state organizations. These policies and algorithms were followed during the patient's care in the ED.   Patient presents with altered mental status, not eating, clinically appears dehydrated.  No signs unremarkable.  Will obtain CT head neck, labs, chest x-ray.  Clinical Course as of Aug 16 1330  Tue Aug 17, 2019  1329 Point of care COVID test positive.   [PS]    Clinical Course User Index [PS] Carrie Mew, MD     ----------------------------------------- 1:32 PM on  08/17/2019 -----------------------------------------  Labs show AKI with a creatinine of 3.0.  We will plan to admit for hydration and further evaluation of her generalized weakness/encephalopathy in the setting of acute Covid infection.  Chest x-ray does show some airspace opacity consistent with Covid pneumonitis.  I will add on procalcitonin but doubt bacterial infection or sepsis.  X-ray left hip and pelvis unremarkable, CT scan of head and neck also unremarkable without evidence of intracranial hemorrhage or acute  traumatic injury.  ____________________________________________   FINAL CLINICAL IMPRESSION(S) / ED DIAGNOSES    Final diagnoses:  COVID-19 virus infection  AKI (acute kidney injury) ([place])  Acute metabolic encephalopathy  Alzheimer's dementia without behavioral disturbance, unspecified timing of dementia onset [facility])     ED Discharge Orders    None      Portions of this note were generated with dragon dictation software. Dictation errors may occur despite best attempts at proofreading.   Carrie Mew, MD 08/17/19 [phone]

## 2019-08-17 NOTE — ED Notes (Signed)
Pt will be moved to cpod until admission bed available; report called to care nurse [facility] RN

## 2019-08-17 NOTE — Assessment & Plan Note (Signed)
Hold BP meds due to dehydration. Holding ARB due to AKI.

## 2019-08-17 NOTE — Assessment & Plan Note (Signed)
Chronic. Will need updated MAR from [facility].

## 2019-08-17 NOTE — Assessment & Plan Note (Signed)
Likely due to AKI and covid-19. Continue supportive care.

## 2019-08-17 NOTE — Consult Note (Signed)
Remdesivir - Pharmacy Brief Note   O:  ALT: 16 CXR: Hazy bilateral airspace disease has an appearance most worrisome for pneumonia SpO2: 95% on RA   A/P:  Remdesivir 200 mg IVPB once followed by 100 mg IVPB daily x 4 days.   Pearla Dubonnet, PharmD Clinical Pharmacist 08/17/2019 2:41 PM

## 2019-08-17 NOTE — Assessment & Plan Note (Signed)
UA suggestive of acute cystitis. Appears urine cx was sent from ER.  Start IV Rocephin until her urine culture results are finalized.

## 2019-08-17 NOTE — H&P (Signed)
History and Physical    Leah Waller [id number] DOB: [birth year] DOA: 08/17/2019  Referring MD/NP/PA:   PCP: [facility]   Patient coming from:  The patient is coming from [facility].  At baseline, pt is dependent for most of ADL.        Chief Complaint: fall and weakness  HPI: Leah Waller is a 85 y.o. female with medical history significant of hypertension, dementia, bipolar disorder, iron deficiency anemia, who presents with fall and weakness.  Patient has dementia, and is unable to provide accurate medical history. I called her daughter who also dose not know what happened to patient in detail since that she is not living with patient, therefore, most of the history is obtained by discussing the case with ED physician, per EMS report, and with the nursing staff.  Per report, pt was found to have decreased level of alertness, generalized weakness.  Normally she is ambulatory and conversant, however at bedside notes that patient has been more confused and somnolent for the past 2 days. She is unable to stand or ambulate.  Has decreased oral intake. Patient has had multiple falls over the last week or 2.  They note that she appears to stop moving her left leg since yesterday. Patient indicates some throat discomfort but unable to describe any other specific symptoms.  Does not seem to have chest pain or abdominal pain.  No active nausea vomiting, diarrhea noted.  No active cough noted.  ED Course: pt was found to have positive Covid 19 Ag test, WBC 7.0, worsening renal function, temperature normal, blood pressure 123/61, heart rate 63, RR 25, oxygen saturation 93-95% on RA, chest x-ray showed bilateral infiltration.  CT of the head is negative for acute intracranial abnormalities.  CT of C-spine is negative for bony fracture.  X-ray of left hip/pelvis is negative for bony fracture.  Patient is admitted to [place] bed as inpatient.   Review of Systems: Could  not be reviewed due to dementia.  Allergy:  Allergies  Allergen Reactions  . Aspirin Other (See Comments)    Unspecified  . Lisinopril Other (See Comments)    Past Medical History:  Diagnosis Date  . Anxiety   . B12 deficiency   . Bipolar disorder ([place])   . Dementia ([place])   . Hypertension     History reviewed. No pertinent surgical history.  Social History:  reports that she has quit smoking. She has never used smokeless tobacco. She reports previous alcohol use. She reports previous drug use.  Family History: Not reviewed due to dementia Family History  Family history unknown: Yes     Prior to Admission medications   Medication Sig Start Date End Date Taking? Authorizing Provider  acetaminophen (TYLENOL) 500 MG tablet Take 500 mg by mouth every 4 (four) hours as needed for mild pain or fever.   Yes [provider]  alum & mag hydroxide-simeth (ALUMINA-MAGNESIA-SIMETHICONE) 200-200-20 MG/5ML suspension Take 30 mLs by mouth every 6 (six) hours as needed for indigestion or heartburn.   Yes [provider]  amLODipine (NORVASC) 2.5 MG tablet Take 2.5 mg by mouth daily.   Yes [provider]  cyanocobalamin ([employer] VITAMIN B12) 1000 MCG tablet Take 1,000 mcg by mouth daily.  03/17/18  Yes [provider]  donepezil (ARICEPT) 5 MG tablet Take 5 mg by mouth at bedtime.   Yes [provider]  ferrous sulfate 325 (65 FE) MG tablet Take 325 mg by mouth daily with  breakfast.   Yes [provider]  guaiFENesin (ROBITUSSIN) 100 MG/5ML liquid Take 200 mg by mouth every 6 (six) hours as needed for cough.   Yes [provider]  loperamide (IMODIUM) 2 MG capsule Take 2 mg by mouth as needed for diarrhea or loose stools.   Yes [provider]  LORazepam (ATIVAN) 0.5 MG tablet Take 1 tablet (0.5 mg total) by mouth 2 (two) times daily as needed for anxiety. 10/12/18  Yes Ursula Alert, MD  losartan (COZAAR) 50 MG tablet Take 50  mg by mouth daily.   Yes [provider]  magnesium hydroxide (MILK OF MAGNESIA) 400 MG/5ML suspension Take 30 mLs by mouth at bedtime as needed for mild constipation.   Yes [provider]  mirtazapine (REMERON) 7.5 MG tablet Take 1 tablet (7.5 mg total) by mouth at bedtime. For sleep 10/12/18  Yes Ursula Alert, MD  neomycin-bacitracin-polymyxin (NEOSPORIN) [phone] ointment Apply 1 application topically daily as needed (skin tears).   Yes [provider]  risperiDONE (RISPERDAL) 0.5 MG tablet Take 1 tablet (0.5 mg total) by mouth daily as needed (agitation). 10/12/18  Yes Ursula Alert, MD  risperiDONE (RISPERDAL) 0.5 MG tablet Take 1-1.5 tablets (0.5-0.75 mg total) by mouth as directed. Take 1 tablet in the AM and 1.5 tablets PM 10/12/18 08/17/19 Yes Ursula Alert, MD    Physical Exam: Vitals:   08/17/19 1315 08/17/19 1330 08/17/19 1430 08/17/19 1500  BP: (!) 121/55 (!) 123/48 (!) 128/55 (!) 138/53  Pulse: 65 60 68 71  Resp: (!) 27 (!) 32 (!) 36 (!) 28  Temp:      TempSrc:      SpO2: 94% 93% 96% 95%  Weight:       General: Not in acute distress.  Dry mucous membrane HEENT:       Eyes: PERRL, EOMI, no scleral icterus.       ENT: No discharge from the ears and nose, no pharynx injection, no tonsillar enlargement.        Neck: No JVD, no bruit, no mass felt. Heme: No neck lymph node enlargement. Cardiac: S1/S2, RRR, No murmurs, No gallops or rubs. Respiratory: No rales, wheezing, rhonchi or rubs. GI: Soft, nondistended, nontender, no organomegaly, BS present. GU: No hematuria Ext: No pitting leg edema bilaterally. 2+DP/PT pulse bilaterally. Musculoskeletal: No joint deformities, No joint redness or warmth, no limitation of ROM in spin. Skin: No rashes.  Neuro: confused, not oriented X3, cranial nerves II-XII grossly intact, moves all extremities.  Psych: Patient is not psychotic, no suicidal or hemocidal ideation.  Labs on Admission: I have  personally reviewed following labs and imaging studies  CBC: Recent Labs  Lab 08/17/19 1201  WBC 7.0  HGB 11.4*  HCT 36.4  MCV 98.4  PLT 0000000   Basic Metabolic Panel: Recent Labs  Lab 08/17/19 1201  NA 147*  K 4.8  CL 114*  CO2 20*  GLUCOSE 121*  BUN 62*  CREATININE 3.09*  CALCIUM 8.3*   GFR: CrCl cannot be calculated (Unknown ideal weight.). Liver Function Tests: Recent Labs  Lab 08/17/19 1201  AST 26  ALT 16  ALKPHOS 79  BILITOT 0.7  PROT 7.9  ALBUMIN 2.9*   Recent Labs  Lab 08/17/19 1201  LIPASE 33   No results for input(s): AMMONIA in the last 168 hours. Coagulation Profile: No results for input(s): INR, PROTIME in the last 168 hours. Cardiac Enzymes: No results for input(s): CKTOTAL, CKMB, CKMBINDEX, TROPONINI in the last 168 hours.  BNP (last 3 results) No results for input(s): PROBNP in the last 8760 hours. HbA1C: No results for input(s): HGBA1C in the last 72 hours. CBG: No results for input(s): GLUCAP in the last 168 hours. Lipid Profile: No results for input(s): CHOL, HDL, LDLCALC, TRIG, CHOLHDL, LDLDIRECT in the last 72 hours. Thyroid Function Tests: No results for input(s): TSH, T4TOTAL, FREET4, T3FREE, THYROIDAB in the last 72 hours. Anemia Panel: No results for input(s): VITAMINB12, FOLATE, FERRITIN, TIBC, IRON, RETICCTPCT in the last 72 hours. Urine analysis:    Component Value Date/Time   COLORURINE YELLOW (A) 08/13/2018 0823   APPEARANCEUR CLEAR (A) 08/13/2018 0823   LABSPEC 1.018 08/13/2018 0823   PHURINE 5.0 08/13/2018 0823   GLUCOSEU NEGATIVE 08/13/2018 0823   HGBUR SMALL (A) 08/13/2018 0823   BILIRUBINUR NEGATIVE 08/13/2018 0823   KETONESUR NEGATIVE 08/13/2018 0823   PROTEINUR NEGATIVE 08/13/2018 0823   NITRITE NEGATIVE 08/13/2018 0823   LEUKOCYTESUR NEGATIVE 08/13/2018 0823   Sepsis Labs: @LABRCNTIP (procalcitonin:4,lacticidven:4) )No results found for this or any previous visit (from the past 240 hour(s)).    Radiological Exams on Admission: CT HEAD WO CONTRAST  Result Date: 08/17/2019 CLINICAL DATA:  Altered mental status for 2 days. EXAM: CT HEAD WITHOUT CONTRAST CT CERVICAL SPINE WITHOUT CONTRAST TECHNIQUE: Multidetector CT imaging of the head and cervical spine was performed following the standard protocol without intravenous contrast. Multiplanar CT image reconstructions of the cervical spine were also generated. COMPARISON:  Cervical spine MRI 09/19/2008. Head CT scan 04/30/2019. FINDINGS: CT HEAD FINDINGS Brain: No evidence of acute infarction, hemorrhage, hydrocephalus, extra-axial collection or mass lesion/mass effect. Mild chronic microvascular ischemic change again seen. Vascular: Atherosclerosis. Skull: Intact.  No focal lesion. Sinuses/Orbits: Status post cataract surgery on the right. The patient is also status post right maxillary antrostomy. Other: None. Exaggeration of the normal cervical lordosis. 0.3 cm anterolisthesis C4 on C5 due to facet arthropathy. CT CERVICAL SPINE FINDINGS Alignment: There is some exaggeration of the normal cervical lordosis. 0.3 cm anterolisthesis C4 on C5 due to facet arthropathy. Skull base and vertebrae: No acute fracture. No primary bone lesion or focal pathologic process. Soft tissues and spinal canal: No prevertebral fluid or swelling. No visible canal hematoma. Disc levels: There is some loss of disc space height at C4-5. Small disc bulges are seen at C5-6 and C6-7. Upper chest: Lung apices clear. Other: None. IMPRESSION: No acute abnormality head or cervical spine. Chronic microvascular change. Mild cervical spondylosis. Electronically Signed   By: Inge Rise M.D.   On: 08/17/2019 13:27   CT CERVICAL SPINE WO CONTRAST  Result Date: 08/17/2019 CLINICAL DATA:  Altered mental status for 2 days. EXAM: CT HEAD WITHOUT CONTRAST CT CERVICAL SPINE WITHOUT CONTRAST TECHNIQUE: Multidetector CT imaging of the head and cervical spine was performed following the  standard protocol without intravenous contrast. Multiplanar CT image reconstructions of the cervical spine were also generated. COMPARISON:  Cervical spine MRI 09/19/2008. Head CT scan 04/30/2019. FINDINGS: CT HEAD FINDINGS Brain: No evidence of acute infarction, hemorrhage, hydrocephalus, extra-axial collection or mass lesion/mass effect. Mild chronic microvascular ischemic change again seen. Vascular: Atherosclerosis. Skull: Intact.  No focal lesion. Sinuses/Orbits: Status post cataract surgery on the right. The patient is also status post right maxillary antrostomy. Other: None. Exaggeration of the normal cervical lordosis. 0.3 cm anterolisthesis C4 on C5 due to facet arthropathy. CT CERVICAL SPINE FINDINGS Alignment: There is some exaggeration of the normal cervical lordosis. 0.3 cm anterolisthesis C4 on C5 due to facet arthropathy. Skull base and  vertebrae: No acute fracture. No primary bone lesion or focal pathologic process. Soft tissues and spinal canal: No prevertebral fluid or swelling. No visible canal hematoma. Disc levels: There is some loss of disc space height at C4-5. Small disc bulges are seen at C5-6 and C6-7. Upper chest: Lung apices clear. Other: None. IMPRESSION: No acute abnormality head or cervical spine. Chronic microvascular change. Mild cervical spondylosis. Electronically Signed   By: Inge Rise M.D.   On: 08/17/2019 13:27   DG Chest Portable 1 View  Result Date: 08/17/2019 CLINICAL DATA:  Altered mental status for 2 days. EXAM: PORTABLE CHEST 1 VIEW COMPARISON:  Single-view of the chest 07/08/2009. FINDINGS: Hazy airspace disease is seen bilaterally, most notable in the upper lung zones and left lung base. No pneumothorax or pleural effusion. Heart size is mildly enlarged. No acute or focal bony abnormality. IMPRESSION: Hazy bilateral airspace disease has an appearance most worrisome for pneumonia. Electronically Signed   By: Inge Rise M.D.   On: 08/17/2019 13:16   DG  Hip Unilat W or Wo Pelvis 2-3 Views Left  Result Date: 08/17/2019 CLINICAL DATA:  Altered mental status. The patient refuses to ambulate. No known injury. EXAM: DG HIP (WITH OR WITHOUT PELVIS) 2-3V LEFT COMPARISON:  None. FINDINGS: There is no evidence of hip fracture or dislocation. There is no evidence of arthropathy or other focal bone abnormality. IMPRESSION: Negative exam. Electronically Signed   By: Inge Rise M.D.   On: 08/17/2019 13:15     EKG: Independently reviewed.  Sinus rhythm, QTC 446, LAE, T wave inversion in V1-V4.   Assessment/Plan Principal Problem:   Acute respiratory disease due to COVID-19 virus Active Problems:   Alzheimer's dementia with behavioral disturbance ([place])   Hypertension   Acute metabolic encephalopathy   Fall   Depression with anxiety   Iron deficiency anemia   Acute renal failure superimposed on stage 3a chronic kidney disease ([place])   Acute respiratory disease due to COVID-19 virus: O2 sat 93% -->95% on RA.  Chest x-ray bilateral infiltration  -will admit to med-surg bed as inpt -Remdesivir per pharm -Solumedrol 30 mg bid -vitamin C, zinc.  -Bronchodilators -Atrovent inhaler, PRN Xopenex or albuterol inhaler -PRN Mucinex for cough -f/u Blood culture -Gentle IV fluid:  -D-dimer, BNP,Trop, LFT, CRP, LDH, Procalcitonin, Ferritin, fibinogen, TG, Hep B SAg -Daily CRP, Ferritin, D-dimer, -Will ask the patient to maintain an awake prone position for 16+ hours a day, if possible, with a minimum of 2-3 hours at a time -Will attempt to maintain euvolemia to a net negative fluid status -IF patient deteriorates, will consult PCCM and ID  Alzheimer's dementia with behavioral disturbance ([place]): -continue home donepezil  HTN:  -Continue home medications: Amlodipine -Hold Cozaar due to worsening renal function -hydralazine prn  Acute metabolic encephalopathy: Likely due to ongoing infection.  CT head and CT of C-spine negative. -Frequent neuro  check  Fall: Imaging negative. -Fall precaution  Depression with anxiety: -Continue home medications  Iron deficiency anemia: Hemoglobin stable, 11.4 -Follow-up of CBC   Acute renal failure superimposed on stage 3a chronic kidney disease ([place]): Baseline creatinine 1.73.  Her creatinine is at 3.09, BUN 62 likely due to prerenal secondary to dehydration -IV fluid: 1 L normal saline, followed by D5-half-normal saline at 75 cc/h    Inpatient status:  # Patient requires inpatient status due to high intensity of service, high risk for further deterioration and high frequency of surveillance required.  I certify that at the point of  admission it is my clinical judgment that the patient will require inpatient hospital care spanning beyond 2 midnights from the point of admission.  . This patient has multiple chronic comorbidities including hypertension, dementia, bipolar disorder, iron deficiency anemia . Now patient has presenting with acute respiratory disease due to COVID-19 virus, worsening renal function, worsening mental status . The initial radiographic and laboratory data are worrisome because of positive COVID-19 Ag test, worsening renal function, bilateral infiltration on chest x-ray . Current medical needs: please see my assessment and plan . Predictability of an adverse outcome (risk): Patient has multiple comorbidities as listed above. Now presents with acute respiratory disease due to COVID-19 virus, worsening renal function, worsening mental status. Patient's presentation is highly complicated.  Patient is at high risk of deteriorating.  Will need to be treated in hospital for at least 2 days.            . DVT ppx: SQ Heparin Code Status: DNR per her daughter Family Communication: Yes, patient's daughter by phone Disposition Plan:  Anticipate discharge back to previous SNF Consults called:  none Admission status: Med-surg bed as inpt     Date of Service 08/17/2019     [facility]   If 7PM-7AM, please contact night-coverage [email or web address] 08/17/2019, 7:02 PM

## 2019-08-17 NOTE — Assessment & Plan Note (Signed)
Per admission H&P performed at [facility]. hospitalist spoke with pt's dtr and pt is a DNR.

## 2019-08-17 NOTE — Assessment & Plan Note (Signed)
Acutely worse. Pt's baseline Scr 1.2-1.7

## 2019-08-17 NOTE — ED Notes (Addendum)
Pt resting quietly on stretcher, eyes open; pt undressed (blue long sleeve shirt, tan pants, green socks) and assisted into hosp gown, yellow slipper socks and fall bracelet; pt is wearing her own glasses, silver tone bracelet on left wrist and silver tone heart shaped ring on left 4th finger; all belongings placed in pt belonging bag to bedside; purwick in place; pt repos in bed for comfort and st "no" when asked if in pain; st "yes" when asked if comfortable

## 2019-08-17 NOTE — Assessment & Plan Note (Signed)
Will need PT consult when she is closer to discharge date to assess pt's need for skilled vs home health PT.

## 2019-08-17 NOTE — ED Notes (Signed)
Legal guardian at bedside. 

## 2019-08-17 NOTE — Assessment & Plan Note (Signed)
Continue with gentle IVF hydration. Repeat CMP in AM.

## 2019-08-17 NOTE — Assessment & Plan Note (Signed)
Pt given IVF bolus in ER. Gentle IVF hydration. Repeat CMP in AM.

## 2019-08-18 ENCOUNTER — Inpatient Hospital Stay (HOSPITAL_COMMUNITY)
Admission: AD | Admit: 2019-08-18 | Discharge: 2019-08-24 | DRG: 177 | Disposition: A | Discharge status: Skilled Nursing Facility | Payer: Medicare HMO | Source: Other Acute Inpatient Hospital | Attending: Internal Medicine | Admitting: Internal Medicine

## 2019-08-18 DIAGNOSIS — F0150 Vascular dementia without behavioral disturbance: Secondary | ICD-10-CM | POA: Diagnosis not present

## 2019-08-18 DIAGNOSIS — I5042 Chronic combined systolic (congestive) and diastolic (congestive) heart failure: Secondary | ICD-10-CM | POA: Diagnosis not present

## 2019-08-18 DIAGNOSIS — F0281 Dementia in other diseases classified elsewhere with behavioral disturbance: Secondary | ICD-10-CM | POA: Diagnosis not present

## 2019-08-18 DIAGNOSIS — G300 Alzheimer's disease with early onset: Secondary | ICD-10-CM

## 2019-08-18 DIAGNOSIS — J069 Acute upper respiratory infection, unspecified: Secondary | ICD-10-CM

## 2019-08-18 DIAGNOSIS — Z886 Allergy status to analgesic agent status: Secondary | ICD-10-CM | POA: Diagnosis not present

## 2019-08-18 DIAGNOSIS — N1831 Chronic kidney disease, stage 3a: Secondary | ICD-10-CM | POA: Diagnosis present

## 2019-08-18 DIAGNOSIS — R1311 Dysphagia, oral phase: Secondary | ICD-10-CM | POA: Diagnosis not present

## 2019-08-18 DIAGNOSIS — G9341 Metabolic encephalopathy: Secondary | ICD-10-CM | POA: Diagnosis not present

## 2019-08-18 DIAGNOSIS — R296 Repeated falls: Secondary | ICD-10-CM | POA: Diagnosis present

## 2019-08-18 DIAGNOSIS — E870 Hyperosmolality and hypernatremia: Secondary | ICD-10-CM | POA: Diagnosis not present

## 2019-08-18 DIAGNOSIS — Z9181 History of falling: Secondary | ICD-10-CM

## 2019-08-18 DIAGNOSIS — I10 Essential (primary) hypertension: Secondary | ICD-10-CM

## 2019-08-18 DIAGNOSIS — E860 Dehydration: Secondary | ICD-10-CM | POA: Diagnosis present

## 2019-08-18 DIAGNOSIS — G309 Alzheimer's disease, unspecified: Secondary | ICD-10-CM | POA: Diagnosis present

## 2019-08-18 DIAGNOSIS — Z66 Do not resuscitate: Secondary | ICD-10-CM | POA: Diagnosis present

## 2019-08-18 DIAGNOSIS — F29 Unspecified psychosis not due to a substance or known physiological condition: Secondary | ICD-10-CM | POA: Diagnosis not present

## 2019-08-18 DIAGNOSIS — J1282 Pneumonia due to coronavirus disease 2019: Secondary | ICD-10-CM | POA: Diagnosis present

## 2019-08-18 DIAGNOSIS — B9620 Unspecified Escherichia coli [E. coli] as the cause of diseases classified elsewhere: Secondary | ICD-10-CM | POA: Diagnosis present

## 2019-08-18 DIAGNOSIS — I340 Nonrheumatic mitral (valve) insufficiency: Secondary | ICD-10-CM | POA: Diagnosis not present

## 2019-08-18 DIAGNOSIS — F0390 Unspecified dementia without behavioral disturbance: Secondary | ICD-10-CM | POA: Diagnosis present

## 2019-08-18 DIAGNOSIS — R278 Other lack of coordination: Secondary | ICD-10-CM | POA: Diagnosis not present

## 2019-08-18 DIAGNOSIS — U071 COVID-19: Principal | ICD-10-CM | POA: Diagnosis present

## 2019-08-18 DIAGNOSIS — R001 Bradycardia, unspecified: Secondary | ICD-10-CM | POA: Diagnosis present

## 2019-08-18 DIAGNOSIS — M6281 Muscle weakness (generalized): Secondary | ICD-10-CM | POA: Diagnosis not present

## 2019-08-18 DIAGNOSIS — R488 Other symbolic dysfunctions: Secondary | ICD-10-CM | POA: Diagnosis not present

## 2019-08-18 DIAGNOSIS — R279 Unspecified lack of coordination: Secondary | ICD-10-CM | POA: Diagnosis not present

## 2019-08-18 DIAGNOSIS — N3000 Acute cystitis without hematuria: Secondary | ICD-10-CM | POA: Diagnosis present

## 2019-08-18 DIAGNOSIS — I130 Hypertensive heart and chronic kidney disease with heart failure and stage 1 through stage 4 chronic kidney disease, or unspecified chronic kidney disease: Secondary | ICD-10-CM | POA: Diagnosis present

## 2019-08-18 DIAGNOSIS — R1319 Other dysphagia: Secondary | ICD-10-CM | POA: Diagnosis not present

## 2019-08-18 DIAGNOSIS — I351 Nonrheumatic aortic (valve) insufficiency: Secondary | ICD-10-CM | POA: Diagnosis not present

## 2019-08-18 DIAGNOSIS — F418 Other specified anxiety disorders: Secondary | ICD-10-CM | POA: Diagnosis not present

## 2019-08-18 DIAGNOSIS — N179 Acute kidney failure, unspecified: Secondary | ICD-10-CM | POA: Diagnosis not present

## 2019-08-18 DIAGNOSIS — F319 Bipolar disorder, unspecified: Secondary | ICD-10-CM | POA: Diagnosis present

## 2019-08-18 DIAGNOSIS — M6389 Disorders of muscle in diseases classified elsewhere, multiple sites: Secondary | ICD-10-CM | POA: Diagnosis not present

## 2019-08-18 DIAGNOSIS — R4182 Altered mental status, unspecified: Secondary | ICD-10-CM | POA: Diagnosis not present

## 2019-08-18 DIAGNOSIS — R2681 Unsteadiness on feet: Secondary | ICD-10-CM | POA: Diagnosis not present

## 2019-08-18 DIAGNOSIS — F419 Anxiety disorder, unspecified: Secondary | ICD-10-CM | POA: Diagnosis present

## 2019-08-18 DIAGNOSIS — N183 Chronic kidney disease, stage 3 unspecified: Secondary | ICD-10-CM | POA: Diagnosis present

## 2019-08-18 DIAGNOSIS — Z888 Allergy status to other drugs, medicaments and biological substances status: Secondary | ICD-10-CM | POA: Diagnosis not present

## 2019-08-18 DIAGNOSIS — I313 Pericardial effusion (noninflammatory): Secondary | ICD-10-CM | POA: Diagnosis present

## 2019-08-18 DIAGNOSIS — Z743 Need for continuous supervision: Secondary | ICD-10-CM | POA: Diagnosis not present

## 2019-08-18 LAB — COMPREHENSIVE METABOLIC PANEL
ALT: 17 U/L (ref 0–44)
AST: 24 U/L (ref 15–41)
Albumin: 2.8 g/dL — ABNORMAL LOW (ref 3.5–5.0)
Alkaline Phosphatase: 80 U/L (ref 38–126)
Anion gap: 11 (ref 5–15)
BUN: 59 mg/dL — ABNORMAL HIGH (ref 8–23)
CO2: 17 mmol/L — ABNORMAL LOW (ref 22–32)
Calcium: 8 mg/dL — ABNORMAL LOW (ref 8.9–10.3)
Chloride: 114 mmol/L — ABNORMAL HIGH (ref 98–111)
Creatinine, Ser: 2.1 mg/dL — ABNORMAL HIGH (ref 0.44–1.00)
GFR calc Af Amer: 25 mL/min — ABNORMAL LOW (ref >60)
GFR calc non Af Amer: 21 mL/min — ABNORMAL LOW (ref >60)
Glucose, Bld: 135 mg/dL — ABNORMAL HIGH (ref 70–99)
Potassium: 4.7 mmol/L (ref 3.5–5.1)
Sodium: 142 mmol/L (ref 135–145)
Total Bilirubin: 0.4 mg/dL (ref 0.3–1.2)
Total Protein: 7.8 g/dL (ref 6.5–8.1)

## 2019-08-18 LAB — CBC WITH DIFFERENTIAL/PLATELET
Abs Immature Granulocytes: 0.22 10*3/uL — ABNORMAL HIGH (ref 0.00–0.07)
Basophils Absolute: 0 10*3/uL (ref 0.0–0.1)
Basophils Relative: 1 %
Eosinophils Absolute: 0 10*3/uL (ref 0.0–0.5)
Eosinophils Relative: 0 %
HCT: 36.2 % (ref 36.0–46.0)
Hemoglobin: 11.2 g/dL — ABNORMAL LOW (ref 12.0–15.0)
Immature Granulocytes: 4 %
Lymphocytes Relative: 10 %
Lymphs Abs: 0.5 10*3/uL — ABNORMAL LOW (ref 0.7–4.0)
MCH: 30.5 pg (ref 26.0–34.0)
MCHC: 30.9 g/dL (ref 30.0–36.0)
MCV: 98.6 fL (ref 80.0–100.0)
Monocytes Absolute: 0.1 10*3/uL (ref 0.1–1.0)
Monocytes Relative: 3 %
Neutro Abs: 4.1 10*3/uL (ref 1.7–7.7)
Neutrophils Relative %: 82 %
Platelets: 171 10*3/uL (ref 150–400)
RBC: 3.67 MIL/uL — ABNORMAL LOW (ref 3.87–5.11)
RDW: 13.9 % (ref 11.5–15.5)
WBC: 5.1 10*3/uL (ref 4.0–10.5)
nRBC: 0 % (ref 0.0–0.2)

## 2019-08-18 LAB — MAGNESIUM: Magnesium: 2.6 mg/dL — ABNORMAL HIGH (ref 1.7–2.4)

## 2019-08-18 LAB — D-DIMER, QUANTITATIVE: D-Dimer, Quant: 1.45 ug/mL-FEU — ABNORMAL HIGH (ref 0.00–0.50)

## 2019-08-18 LAB — PHOSPHORUS: Phosphorus: 4.7 mg/dL — ABNORMAL HIGH (ref 2.5–4.6)

## 2019-08-18 LAB — C-REACTIVE PROTEIN
CRP: 10.7 mg/dL — ABNORMAL HIGH (ref <1.0)
CRP: 10.3 mg/dL — ABNORMAL HIGH (ref <1.0)

## 2019-08-18 LAB — CREATININE, SERUM
Creatinine, Ser: 2.08 mg/dL — ABNORMAL HIGH (ref 0.44–1.00)
GFR calc Af Amer: 25 mL/min — ABNORMAL LOW (ref >60)
GFR calc non Af Amer: 21 mL/min — ABNORMAL LOW (ref >60)

## 2019-08-18 LAB — HEPATITIS B SURFACE ANTIGEN: Hepatitis B Surface Ag: NONREACTIVE

## 2019-08-18 LAB — FERRITIN: Ferritin: 1051 ng/mL — ABNORMAL HIGH (ref 11–307)

## 2019-08-18 MED ORDER — SENNOSIDES-DOCUSATE SODIUM 8.6-50 MG PO TABS: 1.0000 | ORAL_TABLET | Freq: Every evening | ORAL | Status: DC | PRN

## 2019-08-18 MED ORDER — ACETAMINOPHEN 325 MG PO TABS: 650.0000 mg | ORAL_TABLET | Freq: Four times a day (QID) | ORAL | Status: DC | PRN

## 2019-08-18 MED ORDER — SODIUM CHLORIDE 0.9 % IV SOLN
1.0000 g | INTRAVENOUS | Status: DC
  Administered 2019-08-18 – 2019-08-20 (×3): 1 g via INTRAVENOUS
  Filled 2019-08-18 (×3): qty 10

## 2019-08-18 MED ORDER — ASCORBIC ACID 500 MG PO TABS
500.0000 mg | ORAL_TABLET | Freq: Every day | ORAL | Status: DC
  Administered 2019-08-19 – 2019-08-24 (×6): 500 mg via ORAL
  Filled 2019-08-18 (×6): qty 1

## 2019-08-18 MED ORDER — HEPARIN SODIUM (PORCINE) 5000 UNIT/ML IJ SOLN
5000.0000 [IU] | Freq: Three times a day (TID) | INTRAMUSCULAR | Status: DC
  Administered 2019-08-18 – 2019-08-24 (×20): 5000 [IU] via SUBCUTANEOUS
  Filled 2019-08-18 (×21): qty 1

## 2019-08-18 MED ORDER — DEXTROSE-NACL 5-0.45 % IV SOLN: INTRAVENOUS | Status: DC

## 2019-08-18 MED ORDER — ONDANSETRON HCL 4 MG PO TABS: 4.0000 mg | ORAL_TABLET | Freq: Four times a day (QID) | ORAL | Status: DC | PRN

## 2019-08-18 MED ORDER — DOXYLAMINE SUCCINATE (SLEEP) 25 MG PO TABS
25.0000 mg | ORAL_TABLET | Freq: Every evening | ORAL | Status: DC | PRN
  Filled 2019-08-18: qty 1

## 2019-08-18 MED ORDER — DEXAMETHASONE 6 MG PO TABS
6.0000 mg | ORAL_TABLET | ORAL | Status: DC
  Administered 2019-08-18 – 2019-08-24 (×7): 6 mg via ORAL
  Filled 2019-08-18 (×7): qty 1

## 2019-08-18 MED ORDER — ONDANSETRON HCL 4 MG/2ML IJ SOLN: 4.0000 mg | Freq: Four times a day (QID) | INTRAMUSCULAR | Status: DC | PRN

## 2019-08-18 MED ORDER — SODIUM CHLORIDE 0.9 % IV SOLN
100.0000 mg | Freq: Every day | INTRAVENOUS | Status: DC
  Administered 2019-08-18 – 2019-08-20 (×3): 100 mg via INTRAVENOUS
  Filled 2019-08-18 (×3): qty 20

## 2019-08-18 MED ORDER — ZINC SULFATE 220 (50 ZN) MG PO CAPS
220.0000 mg | ORAL_CAPSULE | Freq: Every day | ORAL | Status: DC
  Administered 2019-08-19 – 2019-08-24 (×6): 220 mg via ORAL
  Filled 2019-08-18 (×6): qty 1

## 2019-08-18 MED ORDER — IPRATROPIUM-ALBUTEROL 20-100 MCG/ACT IN AERS
1.0000 | INHALATION_SPRAY | Freq: Four times a day (QID) | RESPIRATORY_TRACT | Status: DC
  Administered 2019-08-18 – 2019-08-24 (×24): 1 via RESPIRATORY_TRACT
  Filled 2019-08-18: qty 4

## 2019-08-18 NOTE — Progress Notes (Signed)
   POC reviewed with patient, cont on q2 turns,   Pt somnolent Glasses, pant and shirt in room Falls precautions and dnr in place          Leah Waller 08/18/2019,

## 2019-08-18 NOTE — Plan of Care (Signed)
   Vital Signs MEWS/VS Documentation       08/18/2019 0700 08/18/2019 0725 08/18/2019 1149 08/18/2019 1604   MEWS Score:  0  0  1  0   MEWS Score Color:  Green  Green  Green  Green   Resp:  --  20  (!) 22  20   Pulse:  --  (!) 58  (!) 51  (!) 51   BP:  --  (!) 121/55  126/65  (!) 127/46   Temp:  --  97.6 F (36.4 C)  97.6 F (36.4 C)  97.8 F (36.6 C)   O2 Device:  --  Room [employer]  Room Air   Level of Consciousness:  --  Alert  Alert  Alert        POC reviewed with pt    Paticia Stack Briannie Gutierrez 08/18/2019,7:41 PM

## 2019-08-18 NOTE — Plan of Care (Signed)
falls

## 2019-08-18 NOTE — Progress Notes (Signed)
PROGRESS NOTE    Leah Waller  [id number] DOB: Aug 20, 1934 DOA: 08/18/2019 PCP: Orvis Brill, [facility]   Brief Narrative:  Leah Waller is a 85 y.o. BF PMHx Dementia, Bipolar, Anxiety, HTN, iron deficiency anemia, CKD stage III  Presents with fall and weakness.  Patient has dementia, and is unable to provide accurate medical history. I called her daughter who also dose not know what happened to patient in detail since that she is not living with patient, therefore, most of the history is obtained by discussing the case with ED physician, per EMS report, and with the nursing staff.  Per report, pt was found to have decreased level of alertness, generalized weakness.  Normally she is ambulatory and conversant, however at bedside notes that patient has been more confused and somnolent for the past 2 days. She is unable to stand or ambulate.  Has decreased oral intake. Patient has had multiple falls over the last week or 2.They note that she appears to stop moving her left leg since yesterday. Patient indicates some throat discomfort but unable to describe any other specific symptoms.  Does not seem to have chest pain or abdominal pain.  No active nausea vomiting, diarrhea noted.  No active cough noted.  ED Course: pt was found to have positive Covid 19 Ag test, WBC 7.0, worsening renal function, temperature normal, blood pressure 123/61, heart rate 63, RR 25, oxygen saturation 93-95% on RA, chest x-ray showed bilateral infiltration.  CT of the head is negative for acute intracranial abnormalities.  CT of C-spine is negative for bony fracture.  X-ray of left hip/pelvis is negative for bony fracture.  Patient is admitted to [facility] bed as inpatient.   Subjective: Patient seen earlier today.  However NOTE unresponsive to sternal rub or painful stimuli.   Assessment & Plan:   Principal Problem:   Pneumonia due to COVID-19 virus Active Problems:   Alzheimer's dementia  with behavioral disturbance ([place])   Essential hypertension   CKD (chronic kidney disease) stage 3, GFR 30-59 ml/min   COVID-19 virus infection   Acute metabolic encephalopathy   Frequent falls   Acute renal failure superimposed on stage 3a chronic kidney disease (HCC)   Acute respiratory disease due to COVID-19 virus   Dehydration with hypernatremia   Acute cystitis   DNR (do not resuscitate)   Dementia without behavioral disturbance ([place])   Bipolar 1 disorder ([place])   Pneumonia due to COVID-19 virus -Decadron 6 mg daily -Remdesivir per pharmacy protocol -Vitamins per Covid protocol -Combivent -Titrate O2 to maintain SPO2 > 88% -Prone patient 16 hours/day; if cannot tolerate prone patient 2 to 3 hours per shift.  Acute metabolic encephalopathy -Likely due to AKI and covid-19.  -DC all sedating medication  Acute on CKD stage IIIa (baseline Cr 1.73) Recent Labs  Lab 08/17/19 1201 08/18/19 0520  CREATININE 3.09* 2.10*  2.08*  -D5-0.45% saline 80ml/hr  Dehydration with hypernatremia Continue with gentle IVF hydration. Repeat CMP in AM.  Essential hypertension Hold BP meds due to dehydration. Holding ARB due to AKI.  Frequent falls Will need PT consult when she is closer to discharge date to assess pt's need for skilled vs home health PT.  Alzheimer's dementia with behavioral disturbance (HCC) Chronic. Will need updated MAR from [facility].   Acute cystitis UA suggestive of acute cystitis. Appears urine cx was sent from ER.  Start IV Rocephin until her urine culture results are finalized.     DVT prophylaxis: Heparin subcu Code  Status: DNR Family Communication:  Disposition Plan:    Consultants:    Procedures/Significant Events:     I have personally reviewed and interpreted all radiology studies and my findings are as above.  VENTILATOR SETTINGS:    Cultures 2/16 blood right AC NGTD 2/16 blood LEFT hand NGTD 2/16 urine  pending   Antimicrobials: Anti-infectives (From admission, onward)   Start     Dose/Rate Stop   08/18/19 1000  remdesivir 100 mg in sodium chloride 0.9 % 100 mL IVPB     100 mg 200 mL/hr over 30 Minutes 08/22/19 0959   08/18/19 0600  cefTRIAXone (ROCEPHIN) 1 g in sodium chloride 0.9 % 100 mL IVPB     1 g 200 mL/hr over 30 Minutes 08/23/19 0559       Devices    LINES / TUBES:     Continuous Infusions: . cefTRIAXone (ROCEPHIN)  IV 1 g (08/18/19 0538)  . dextrose 5 % and 0.45% NaCl 50 mL/hr at 08/18/19 0536  . remdesivir 100 mg in NS 100 mL       Objective: Vitals:   08/18/19 0106 08/18/19 0319 08/18/19 0725  BP: (!) 156/62 (!) 154/60 (!) 121/55  Pulse: 64  (!) 58  Resp: 19 20 20   Temp: 98.8 F (37.1 C) 98 F (36.7 C) 97.6 F (36.4 C)  TempSrc: Oral Oral Axillary  SpO2: 92% 97% 94%    Intake/Output Summary (Last 24 hours) at 08/18/2019 0746 Last data filed at 08/18/2019 0600 Gross per 24 hour  Intake 66.89 ml  Output 250 ml  Net -183.11 ml   There were no vitals filed for this visit.  Examination:  General: A/O x0 does not respond to painful stimuli, no acute respiratory distress Eyes: negative scleral hemorrhage, negative anisocoria, negative icterus ENT: Negative Runny nose, negative gingival bleeding, Neck:  Negative scars, masses, torticollis, lymphadenopathy, JVD Lungs: Clear to auscultation bilaterally without wheezes or crackles Cardiovascular: Regular rate and rhythm without murmur gallop or rub normal S1 and S2 Abdomen: negative abdominal pain, nondistended, positive soft, bowel sounds, no rebound, no ascites, no appreciable mass Extremities: No significant cyanosis, clubbing, or edema bilateral lower extremities Skin: Negative rashes, lesions, ulcers Psychiatric: Unable to evaluate Central nervous system: Unable to evaluate.  Patient did not respond to sternal rub or other painful stimuli .     Data Reviewed: Care during the described time  interval was provided by me .  I have reviewed this patient's available data, including medical history, events of note, physical examination, and all test results as part of my evaluation.   CBC: Recent Labs  Lab 08/17/19 1201  WBC 7.0  HGB 11.4*  HCT 36.4  MCV 98.4  PLT 0000000   Basic Metabolic Panel: Recent Labs  Lab 08/17/19 1201 08/18/19 0520  NA 147*  --   K 4.8  --   CL 114*  --   CO2 20*  --   GLUCOSE 121*  --   BUN 62*  --   CREATININE 3.09* 2.08*  CALCIUM 8.3*  --    GFR: CrCl cannot be calculated (Unknown ideal weight.). Liver Function Tests: Recent Labs  Lab 08/17/19 1201  AST 26  ALT 16  ALKPHOS 79  BILITOT 0.7  PROT 7.9  ALBUMIN 2.9*   Recent Labs  Lab 08/17/19 1201  LIPASE 33   No results for input(s): AMMONIA in the last 168 hours. Coagulation Profile: No results for input(s): INR, PROTIME in the last 168 hours. Cardiac Enzymes:  No results for input(s): CKTOTAL, CKMB, CKMBINDEX, TROPONINI in the last 168 hours. BNP (last 3 results) No results for input(s): PROBNP in the last 8760 hours. HbA1C: No results for input(s): HGBA1C in the last 72 hours. CBG: No results for input(s): GLUCAP in the last 168 hours. Lipid Profile: Recent Labs    08/17/19 1909  TRIG 104   Thyroid Function Tests: No results for input(s): TSH, T4TOTAL, FREET4, T3FREE, THYROIDAB in the last 72 hours. Anemia Panel: Recent Labs    08/17/19 1649  FERRITIN 863*   Urine analysis:    Component Value Date/Time   COLORURINE YELLOW (A) 08/17/2019 1909   APPEARANCEUR HAZY (A) 08/17/2019 1909   LABSPEC 1.015 08/17/2019 1909   PHURINE 5.0 08/17/2019 1909   GLUCOSEU NEGATIVE 08/17/2019 1909   HGBUR NEGATIVE 08/17/2019 [place] NEGATIVE 08/17/2019 [place] NEGATIVE 08/17/2019 1909   PROTEINUR 30 (A) 08/17/2019 1909   NITRITE POSITIVE (A) 08/17/2019 1909   LEUKOCYTESUR MODERATE (A) 08/17/2019 1909   Sepsis  Labs: @LABRCNTIP (procalcitonin:4,lacticidven:4)  ) Recent Results (from the past 240 hour(s))  Culture, blood (Routine X 2) w Reflex to ID Panel     Status: None (Preliminary result)   Collection Time: 08/17/19  7:08 PM   Specimen: BLOOD  Result Value Ref Range Status   Specimen Description BLOOD RIGHT ANTECUBITAL  Final   Special Requests   Final    BOTTLES DRAWN AEROBIC AND ANAEROBIC Blood Culture adequate volume   Culture   Final    NO GROWTH < 12 HOURS Performed at [facility], [address] [place], [place] [address]    Report Status PENDING  Incomplete  Culture, blood (Routine X 2) w Reflex to ID Panel     Status: None (Preliminary result)   Collection Time: 08/17/19  7:09 PM   Specimen: BLOOD  Result Value Ref Range Status   Specimen Description BLOOD BLOOD LEFT HAND  Final   Special Requests   Final    BOTTLES DRAWN AEROBIC AND ANAEROBIC Blood Culture adequate volume   Culture   Final    NO GROWTH < 12 HOURS Performed at [facility], [address] [place], [place] [address]    Report Status PENDING  Incomplete         Radiology Studies: CT HEAD WO CONTRAST  Result Date: 08/17/2019 CLINICAL DATA:  Altered mental status for 2 days. EXAM: CT HEAD WITHOUT CONTRAST CT CERVICAL SPINE WITHOUT CONTRAST TECHNIQUE: Multidetector CT imaging of the head and cervical spine was performed following the standard protocol without intravenous contrast. Multiplanar CT image reconstructions of the cervical spine were also generated. COMPARISON:  Cervical spine MRI 09/19/2008. Head CT scan 04/30/2019. FINDINGS: CT HEAD FINDINGS Brain: No evidence of acute infarction, hemorrhage, hydrocephalus, extra-axial collection or mass lesion/mass effect. Mild chronic microvascular ischemic change again seen. Vascular: Atherosclerosis. Skull: Intact.  No focal lesion. Sinuses/Orbits: Status post cataract surgery on the right. The patient is also status post right maxillary  antrostomy. Other: None. Exaggeration of the normal cervical lordosis. 0.3 cm anterolisthesis C4 on C5 due to facet arthropathy. CT CERVICAL SPINE FINDINGS Alignment: There is some exaggeration of the normal cervical lordosis. 0.3 cm anterolisthesis C4 on C5 due to facet arthropathy. Skull base and vertebrae: No acute fracture. No primary bone lesion or focal pathologic process. Soft tissues and spinal canal: No prevertebral fluid or swelling. No visible canal hematoma. Disc levels: There is some loss of disc space height at C4-5. Small disc  bulges are seen at C5-6 and C6-7. Upper chest: Lung apices clear. Other: None. IMPRESSION: No acute abnormality head or cervical spine. Chronic microvascular change. Mild cervical spondylosis. Electronically Signed   By: Inge Rise M.D.   On: 08/17/2019 13:27   CT CERVICAL SPINE WO CONTRAST  Result Date: 08/17/2019 CLINICAL DATA:  Altered mental status for 2 days. EXAM: CT HEAD WITHOUT CONTRAST CT CERVICAL SPINE WITHOUT CONTRAST TECHNIQUE: Multidetector CT imaging of the head and cervical spine was performed following the standard protocol without intravenous contrast. Multiplanar CT image reconstructions of the cervical spine were also generated. COMPARISON:  Cervical spine MRI 09/19/2008. Head CT scan 04/30/2019. FINDINGS: CT HEAD FINDINGS Brain: No evidence of acute infarction, hemorrhage, hydrocephalus, extra-axial collection or mass lesion/mass effect. Mild chronic microvascular ischemic change again seen. Vascular: Atherosclerosis. Skull: Intact.  No focal lesion. Sinuses/Orbits: Status post cataract surgery on the right. The patient is also status post right maxillary antrostomy. Other: None. Exaggeration of the normal cervical lordosis. 0.3 cm anterolisthesis C4 on C5 due to facet arthropathy. CT CERVICAL SPINE FINDINGS Alignment: There is some exaggeration of the normal cervical lordosis. 0.3 cm anterolisthesis C4 on C5 due to facet arthropathy. Skull base and  vertebrae: No acute fracture. No primary bone lesion or focal pathologic process. Soft tissues and spinal canal: No prevertebral fluid or swelling. No visible canal hematoma. Disc levels: There is some loss of disc space height at C4-5. Small disc bulges are seen at C5-6 and C6-7. Upper chest: Lung apices clear. Other: None. IMPRESSION: No acute abnormality head or cervical spine. Chronic microvascular change. Mild cervical spondylosis. Electronically Signed   By: Inge Rise M.D.   On: 08/17/2019 13:27   DG Chest Portable 1 View  Result Date: 08/17/2019 CLINICAL DATA:  Altered mental status for 2 days. EXAM: PORTABLE CHEST 1 VIEW COMPARISON:  Single-view of the chest 07/08/2009. FINDINGS: Hazy airspace disease is seen bilaterally, most notable in the upper lung zones and left lung base. No pneumothorax or pleural effusion. Heart size is mildly enlarged. No acute or focal bony abnormality. IMPRESSION: Hazy bilateral airspace disease has an appearance most worrisome for pneumonia. Electronically Signed   By: Inge Rise M.D.   On: 08/17/2019 13:16   DG Hip Unilat W or Wo Pelvis 2-3 Views Left  Result Date: 08/17/2019 CLINICAL DATA:  Altered mental status. The patient refuses to ambulate. No known injury. EXAM: DG HIP (WITH OR WITHOUT PELVIS) 2-3V LEFT COMPARISON:  None. FINDINGS: There is no evidence of hip fracture or dislocation. There is no evidence of arthropathy or other focal bone abnormality. IMPRESSION: Negative exam. Electronically Signed   By: Inge Rise M.D.   On: 08/17/2019 13:15        Scheduled Meds: . dexamethasone  6 mg Oral Q24H  . heparin  5,000 Units Subcutaneous Q8H   Continuous Infusions: . cefTRIAXone (ROCEPHIN)  IV 1 g (08/18/19 0538)  . dextrose 5 % and 0.45% NaCl 50 mL/hr at 08/18/19 0536  . remdesivir 100 mg in NS 100 mL       LOS: 0 days   The patient is critically ill with multiple organ systems failure and requires high complexity decision making  for assessment and support, frequent evaluation and titration of therapies, application of advanced monitoring technologies and extensive interpretation of multiple databases. Critical Care Time devoted to patient care services described in this note  Time spent: 40 minutes     Jospeh Mangel, Geraldo Docker, MD [facility] Pager [phone]  If 7PM-7AM, please contact night-coverage [email or web address] Password [facility] 08/18/2019, 7:46 AM

## 2019-08-18 NOTE — Plan of Care (Signed)

## 2019-08-18 NOTE — H&P (Addendum)
Hospitalist Accept Note  Pt transferred from [facility] due to Covid-19 and altered mental status.  See H&P by Dr. Blaine Hamper  Lab Results  Component Value Date/Time   SARSCOV2 Positive (A) 08/17/2019 01:35 PM    Today's Vitals   08/18/19 0103 08/18/19 0106  BP:  (!) 156/62  Pulse:  64  Resp:  19  Temp:  98.8 F (37.1 C)  TempSrc:  Oral  SpO2:  92%  PainSc: 0-No pain    There is no height or weight on file to calculate BMI.  Physical Exam  Constitutional: No distress.  HENT:  Head: Normocephalic and atraumatic.  Eyes: Right eye exhibits no discharge. Left eye exhibits no discharge.  Neck: No thyromegaly present.  Cardiovascular: Bradycardia present.  HR 51 while sleeping  Pulmonary/Chest: Effort normal. No respiratory distress. She has no wheezes. She has no rales.  RA sats 95%  Abdominal: She exhibits no distension. There is no abdominal tenderness. There is no rebound and no guarding.  Musculoskeletal:        General: No edema.  Skin: Skin is warm and dry. She is not diaphoretic.  Nursing note and vitals reviewed.   Assessment and Plan:  Pneumonia due to COVID-19 virus Admit to medical bed. Continue IV Remdesivir and po decadron. RT for assessment.  Acute metabolic encephalopathy Likely due to AKI and covid-19. Continue supportive care.  Acute renal failure superimposed on stage 3a chronic kidney disease ([place]) Pt given IVF bolus in ER. Gentle IVF hydration. Repeat CMP in AM.  Dehydration with hypernatremia Continue with gentle IVF hydration. Repeat CMP in AM.  Essential hypertension Hold BP meds due to dehydration. Holding ARB due to AKI.  Frequent falls Will need PT consult when she is closer to discharge date to assess pt's need for skilled vs home health PT.  Alzheimer's dementia with behavioral disturbance (HCC) Chronic. Will need updated MAR from [facility]  CKD (chronic kidney disease) stage 3, GFR 30-59 ml/min Acutely worse. Pt's baseline Scr  1.2-1.7  Acute cystitis UA suggestive of acute cystitis. Appears urine cx was sent from ER.  Start IV Rocephin until her urine culture results are finalized.  DNR (do not resuscitate) Per admission H&P performed at [facility]. hospitalist spoke with pt's dtr and pt is a DNR.  Kristopher Oppenheim, DO [facility] 08/18/19 5:00 AM

## 2019-08-18 NOTE — Plan of Care (Signed)
Pt failed swallow eval in the ER in [facility]    DNR band placed on pt

## 2019-08-19 LAB — CBC WITH DIFFERENTIAL/PLATELET
Abs Immature Granulocytes: 0.33 10*3/uL — ABNORMAL HIGH (ref 0.00–0.07)
Basophils Absolute: 0 10*3/uL (ref 0.0–0.1)
Basophils Relative: 1 %
Eosinophils Absolute: 0 10*3/uL (ref 0.0–0.5)
Eosinophils Relative: 0 %
HCT: 32.5 % — ABNORMAL LOW (ref 36.0–46.0)
Hemoglobin: 10.1 g/dL — ABNORMAL LOW (ref 12.0–15.0)
Immature Granulocytes: 6 %
Lymphocytes Relative: 14 %
Lymphs Abs: 0.7 10*3/uL (ref 0.7–4.0)
MCH: 30.6 pg (ref 26.0–34.0)
MCHC: 31.1 g/dL (ref 30.0–36.0)
MCV: 98.5 fL (ref 80.0–100.0)
Monocytes Absolute: 0.4 10*3/uL (ref 0.1–1.0)
Monocytes Relative: 8 %
Neutro Abs: 3.6 10*3/uL (ref 1.7–7.7)
Neutrophils Relative %: 71 %
Platelets: 197 10*3/uL (ref 150–400)
RBC: 3.3 MIL/uL — ABNORMAL LOW (ref 3.87–5.11)
RDW: 13.9 % (ref 11.5–15.5)
WBC: 5.1 10*3/uL (ref 4.0–10.5)
nRBC: 0 % (ref 0.0–0.2)

## 2019-08-19 LAB — COMPREHENSIVE METABOLIC PANEL
ALT: 14 U/L (ref 0–44)
AST: 20 U/L (ref 15–41)
Albumin: 2.4 g/dL — ABNORMAL LOW (ref 3.5–5.0)
Alkaline Phosphatase: 71 U/L (ref 38–126)
Anion gap: 11 (ref 5–15)
BUN: 68 mg/dL — ABNORMAL HIGH (ref 8–23)
CO2: 16 mmol/L — ABNORMAL LOW (ref 22–32)
Calcium: 7.8 mg/dL — ABNORMAL LOW (ref 8.9–10.3)
Chloride: 120 mmol/L — ABNORMAL HIGH (ref 98–111)
Creatinine, Ser: 2.01 mg/dL — ABNORMAL HIGH (ref 0.44–1.00)
GFR calc Af Amer: 26 mL/min — ABNORMAL LOW (ref >60)
GFR calc non Af Amer: 22 mL/min — ABNORMAL LOW (ref >60)
Glucose, Bld: 157 mg/dL — ABNORMAL HIGH (ref 70–99)
Potassium: 4.5 mmol/L (ref 3.5–5.1)
Sodium: 147 mmol/L — ABNORMAL HIGH (ref 135–145)
Total Bilirubin: 0.1 mg/dL — ABNORMAL LOW (ref 0.3–1.2)
Total Protein: 6.8 g/dL (ref 6.5–8.1)

## 2019-08-19 LAB — FERRITIN: Ferritin: 1026 ng/mL — ABNORMAL HIGH (ref 11–307)

## 2019-08-19 LAB — PHOSPHORUS: Phosphorus: 4.2 mg/dL (ref 2.5–4.6)

## 2019-08-19 LAB — MAGNESIUM: Magnesium: 2.4 mg/dL (ref 1.7–2.4)

## 2019-08-19 LAB — D-DIMER, QUANTITATIVE: D-Dimer, Quant: 1.02 ug/mL-FEU — ABNORMAL HIGH (ref 0.00–0.50)

## 2019-08-19 LAB — C-REACTIVE PROTEIN: CRP: 5.5 mg/dL — ABNORMAL HIGH (ref <1.0)

## 2019-08-19 MED ORDER — SODIUM BICARBONATE 8.4 % IV SOLN
100.0000 meq | Freq: Once | INTRAVENOUS | Status: AC
  Administered 2019-08-19: 09:00:00 100 meq via INTRAVENOUS
  Filled 2019-08-19: qty 50

## 2019-08-19 MED ORDER — SODIUM BICARBONATE 8.4 % IV SOLN
INTRAVENOUS | Status: AC
  Filled 2019-08-19: qty 50

## 2019-08-19 MED ORDER — DEXTROSE 5 % IV SOLN: INTRAVENOUS | Status: DC

## 2019-08-19 NOTE — Progress Notes (Signed)
PROGRESS NOTE    Leah Waller  [id number] DOB: [birth year] DOA: 08/18/2019 PCP: Orvis Brill, [facility]   Brief Narrative:  Leah Waller is a 85 y.o. BF PMHx Dementia, Bipolar, Anxiety, HTN, iron deficiency anemia, CKD stage III  Presents with fall and weakness.  Patient has dementia, and is unable to provide accurate medical history. I called her daughter who also dose not know what happened to patient in detail since that she is not living with patient, therefore, most of the history is obtained by discussing the case with ED physician, per EMS report, and with the nursing staff.  Per report, pt was found to have decreased level of alertness, generalized weakness.  Normally she is ambulatory and conversant, however at bedside notes that patient has been more confused and somnolent for the past 2 days. She is unable to stand or ambulate.  Has decreased oral intake. Patient has had multiple falls over the last week or 2.They note that she appears to stop moving her left leg since yesterday. Patient indicates some throat discomfort but unable to describe any other specific symptoms.  Does not seem to have chest pain or abdominal pain.  No active nausea vomiting, diarrhea noted.  No active cough noted.  ED Course: pt was found to have positive Covid 19 Ag test, WBC 7.0, worsening renal function, temperature normal, blood pressure 123/61, heart rate 63, RR 25, oxygen saturation 93-95% on RA, chest x-ray showed bilateral infiltration.  CT of the head is negative for acute intracranial abnormalities.  CT of C-spine is negative for bony fracture.  X-ray of left hip/pelvis is negative for bony fracture.  Patient is [facility] as inpatient.   Subjective: 2/18 afebrile overnight.  A/O x1 (does not know where, when, why).  Negative CP, negative S OB   Assessment & Plan:   Principal Problem:   Pneumonia due to COVID-19 virus Active Problems:   Alzheimer's  dementia with behavioral disturbance ([place])   Essential hypertension   CKD (chronic kidney disease) stage 3, GFR 30-59 ml/min   COVID-19 virus infection   Acute metabolic encephalopathy   Frequent falls   Acute renal failure superimposed on stage 3a chronic kidney disease (HCC)   Acute respiratory disease due to COVID-19 virus   Dehydration with hypernatremia   Acute cystitis   DNR (do not resuscitate)   Dementia without behavioral disturbance ([place])   Bipolar 1 disorder ([place])   Pneumonia due to COVID-19 virus -Decadron 6 mg daily -Remdesivir per pharmacy protocol -Vitamins per Covid protocol -Combivent -Titrate O2 to maintain SPO2 > 88% -Prone patient 16 hours/day; if cannot tolerate prone patient 2 to 3 hours per shift.  Acute metabolic encephalopathy -Likely due to AKI and covid-19.  -DC all sedating medication  Acute on CKD stage IIIa (baseline Cr 1.73) Recent Labs  Lab 08/17/19 1201 08/18/19 0520 08/19/19 0224  CREATININE 3.09* 2.10*  2.08* 2.01*  -2/18 change to D5 W 75 ml/hr -2/18 2 amp sodium bicarb -Improvement with hydration.  Acute cystitis -UA suggestive of acute cystitis. -Empiric antibiotic started, awaiting culture results   Dehydration with hypernatremia -Resolved -See acute on CKD stage III.  Essential hypertension -Hold BP meds due to dehydration.  -Holding ARB due to AKI.  Frequent falls -2/18 PT/OT consult A/O x1 (does not know where, when, why).  States lives alone.  Evaluate for SNF   Alzheimer's dementia with behavioral disturbance ([place]) -Chronic Will need updated MAR from [facility].  DVT prophylaxis: Heparin subcu Code Status: DNR Family Communication:  2/18 attempted to contact Leah Waller (daughter) phone mailbox full Disposition Plan:    Consultants:    Procedures/Significant Events:     I have personally reviewed and interpreted all radiology studies and my findings are as above.  VENTILATOR  SETTINGS: Room air 2/18 SPO2; 95%   Cultures 2/16 blood right AC NGTD 2/16 blood LEFT hand NGTD 2/16 urine pending   Antimicrobials: Anti-infectives (From admission, onward)   Start     Dose/Rate Stop   08/18/19 1000  remdesivir 100 mg in sodium chloride 0.9 % 100 mL IVPB     100 mg 200 mL/hr over 30 Minutes 08/22/19 0959   08/18/19 0600  cefTRIAXone (ROCEPHIN) 1 g in sodium chloride 0.9 % 100 mL IVPB     1 g 200 mL/hr over 30 Minutes 08/23/19 0559       Devices    LINES / TUBES:     Continuous Infusions: . cefTRIAXone (ROCEPHIN)  IV 1 g (08/19/19 0647)  . remdesivir 100 mg in NS 100 mL Stopped (08/18/19 0953)     Objective: Vitals:   08/19/19 0003 08/19/19 0500 08/19/19 0643 08/19/19 0710  BP: 136/74 (!) 113/46 (!) 166/58 (!) 152/71  Pulse: 61 (!) 53    Resp: 15 20 18    Temp: 97.6 F (36.4 C) 97.8 F (36.6 C) 97.6 F (36.4 C)   TempSrc: Oral Oral Oral   SpO2: 95% 96% 93%     Intake/Output Summary (Last 24 hours) at 08/19/2019 0759 Last data filed at 08/19/2019 [id number] Gross per 24 hour  Intake 1116.71 ml  Output 900 ml  Net 216.71 ml   There were no vitals filed for this visit.   Physical Exam:  General: A/O x1 (does not know where, when, why), no acute respiratory distress Eyes: negative scleral hemorrhage, negative anisocoria, negative icterus ENT: Negative Runny nose, negative gingival bleeding, Neck:  Negative scars, masses, torticollis, lymphadenopathy, JVD Lungs: Clear to auscultation bilaterally without wheezes or crackles Cardiovascular: Bradycardic (asymptomatic) without murmur gallop or rub normal S1 and S2 Abdomen: negative abdominal pain, nondistended, positive soft, bowel sounds, no rebound, no ascites, no appreciable mass Extremities: No significant cyanosis, clubbing, or edema bilateral lower extremities Skin: Negative rashes, lesions, ulcers Psychiatric: Not assess altered mental status.  Unsure of baseline.   Central nervous  system:  Cranial nerves II through XII intact, tongue/uvula midline, all extremities muscle strength 5/5, sensation intact throughout, negative expressive aphasia, negative receptive aphasia. .     Data Reviewed: Care during the described time interval was provided by me .  I have reviewed this patient's available data, including medical history, events of note, physical examination, and all test results as part of my evaluation.   CBC: Recent Labs  Lab 08/17/19 1201 08/18/19 0520 08/19/19 0224  WBC 7.0 5.1 5.1  NEUTROABS  --  4.1 3.6  HGB 11.4* 11.2* 10.1*  HCT 36.4 36.2 32.5*  MCV 98.4 98.6 98.5  PLT 176 171 [id number]   Basic Metabolic Panel: Recent Labs  Lab 08/17/19 1201 08/18/19 0520 08/19/19 0224  NA 147* 142 147*  K 4.8 4.7 4.5  CL 114* 114* 120*  CO2 20* 17* 16*  GLUCOSE 121* 135* 157*  BUN 62* 59* 68*  CREATININE 3.09* 2.10*  2.08* 2.01*  CALCIUM 8.3* 8.0* 7.8*  MG  --  2.6* 2.4  PHOS  --  4.7* 4.2   GFR: CrCl cannot be calculated (Unknown ideal weight.). Liver Function  Tests: Recent Labs  Lab 08/17/19 1201 08/18/19 0520 08/19/19 0224  AST 26 24 20   ALT 16 17 14   ALKPHOS 79 80 71  BILITOT 0.7 0.4 0.1*  PROT 7.9 7.8 6.8  ALBUMIN 2.9* 2.8* 2.4*   Recent Labs  Lab 08/17/19 1201  LIPASE 33   No results for input(s): AMMONIA in the last 168 hours. Coagulation Profile: No results for input(s): INR, PROTIME in the last 168 hours. Cardiac Enzymes: No results for input(s): CKTOTAL, CKMB, CKMBINDEX, TROPONINI in the last 168 hours. BNP (last 3 results) No results for input(s): PROBNP in the last 8760 hours. HbA1C: No results for input(s): HGBA1C in the last 72 hours. CBG: No results for input(s): GLUCAP in the last 168 hours. Lipid Profile: Recent Labs    08/17/19 1909  TRIG 104   Thyroid Function Tests: No results for input(s): TSH, T4TOTAL, FREET4, T3FREE, THYROIDAB in the last 72 hours. Anemia Panel: Recent Labs    08/18/19 0520  08/19/19 0224  FERRITIN 1,051* 1,026*   Urine analysis:    Component Value Date/Time   COLORURINE YELLOW (A) 08/17/2019 1909   APPEARANCEUR HAZY (A) 08/17/2019 1909   LABSPEC 1.015 08/17/2019 1909   PHURINE 5.0 08/17/2019 1909   GLUCOSEU NEGATIVE 08/17/2019 1909   HGBUR NEGATIVE 08/17/2019 1909   BILIRUBINUR NEGATIVE 08/17/2019 [place] NEGATIVE 08/17/2019 1909   PROTEINUR 30 (A) 08/17/2019 1909   NITRITE POSITIVE (A) 08/17/2019 1909   LEUKOCYTESUR MODERATE (A) 08/17/2019 1909   Sepsis Labs: @LABRCNTIP (procalcitonin:4,lacticidven:4)  ) Recent Results (from the past 240 hour(s))  Culture, blood (Routine X 2) w Reflex to ID Panel     Status: None (Preliminary result)   Collection Time: 08/17/19  7:08 PM   Specimen: BLOOD  Result Value Ref Range Status   Specimen Description BLOOD RIGHT ANTECUBITAL  Final   Special Requests   Final    BOTTLES DRAWN AEROBIC AND ANAEROBIC Blood Culture adequate volume   Culture   Final    NO GROWTH 2 DAYS Performed at [facility], [address] [place], [place] [address]    Report Status PENDING  Incomplete  Urine culture     Status: Abnormal (Preliminary result)   Collection Time: 08/17/19  7:09 PM   Specimen: Urine, Random  Result Value Ref Range Status   Specimen Description   Final    URINE, RANDOM Performed at [facility], [address] [place], [place] [address]    Special Requests   Final    NONE Performed at [facility], [address] [place], [place] [address]    Culture >=100,000 COLONIES/mL GRAM NEGATIVE RODS (A)  Final   Report Status PENDING  Incomplete  Culture, blood (Routine X 2) w Reflex to ID Panel     Status: None (Preliminary result)   Collection Time: 08/17/19  7:09 PM   Specimen: BLOOD  Result Value Ref Range Status   Specimen Description BLOOD BLOOD LEFT HAND  Final   Special Requests   Final    BOTTLES DRAWN AEROBIC AND ANAEROBIC Blood Culture adequate volume    Culture   Final    NO GROWTH 2 DAYS Performed at [facility] [address] [place], [place] [address]    Report Status PENDING  Incomplete  Culture, Urine     Status: Abnormal (Preliminary result)   Collection Time: 08/18/19  9:12 AM   Specimen: Urine, Clean Catch  Result Value Ref Range Status   Specimen Description  Final    URINE, CLEAN CATCH Performed at [facility], [place] [address], [place], [place] [address]    Special Requests   Final    NONE Performed at [facility], [place] [address], [place], [place] [address]    Culture 50,000 COLONIES/mL GRAM NEGATIVE RODS (A)  Final   Report Status PENDING  Incomplete         Radiology Studies: CT HEAD WO CONTRAST  Result Date: 08/17/2019 CLINICAL DATA:  Altered mental status for 2 days. EXAM: CT HEAD WITHOUT CONTRAST CT CERVICAL SPINE WITHOUT CONTRAST TECHNIQUE: Multidetector CT imaging of the head and cervical spine was performed following the standard protocol without intravenous contrast. Multiplanar CT image reconstructions of the cervical spine were also generated. COMPARISON:  Cervical spine MRI 09/19/2008. Head CT scan 04/30/2019. FINDINGS: CT HEAD FINDINGS Brain: No evidence of acute infarction, hemorrhage, hydrocephalus, extra-axial collection or mass lesion/mass effect. Mild chronic microvascular ischemic change again seen. Vascular: Atherosclerosis. Skull: Intact.  No focal lesion. Sinuses/Orbits: Status post cataract surgery on the right. The patient is also status post right maxillary antrostomy. Other: None. Exaggeration of the normal cervical lordosis. 0.3 cm anterolisthesis C4 on C5 due to facet arthropathy. CT CERVICAL SPINE FINDINGS Alignment: There is some exaggeration of the normal cervical lordosis. 0.3 cm anterolisthesis C4 on C5 due to facet arthropathy. Skull base and vertebrae: No acute fracture. No primary bone lesion or focal pathologic process. Soft tissues and  spinal canal: No prevertebral fluid or swelling. No visible canal hematoma. Disc levels: There is some loss of disc space height at C4-5. Small disc bulges are seen at C5-6 and C6-7. Upper chest: Lung apices clear. Other: None. IMPRESSION: No acute abnormality head or cervical spine. Chronic microvascular change. Mild cervical spondylosis. Electronically Signed   By: Inge Rise M.D.   On: 08/17/2019 13:27   CT CERVICAL SPINE WO CONTRAST  Result Date: 08/17/2019 CLINICAL DATA:  Altered mental status for 2 days. EXAM: CT HEAD WITHOUT CONTRAST CT CERVICAL SPINE WITHOUT CONTRAST TECHNIQUE: Multidetector CT imaging of the head and cervical spine was performed following the standard protocol without intravenous contrast. Multiplanar CT image reconstructions of the cervical spine were also generated. COMPARISON:  Cervical spine MRI 09/19/2008. Head CT scan 04/30/2019. FINDINGS: CT HEAD FINDINGS Brain: No evidence of acute infarction, hemorrhage, hydrocephalus, extra-axial collection or mass lesion/mass effect. Mild chronic microvascular ischemic change again seen. Vascular: Atherosclerosis. Skull: Intact.  No focal lesion. Sinuses/Orbits: Status post cataract surgery on the right. The patient is also status post right maxillary antrostomy. Other: None. Exaggeration of the normal cervical lordosis. 0.3 cm anterolisthesis C4 on C5 due to facet arthropathy. CT CERVICAL SPINE FINDINGS Alignment: There is some exaggeration of the normal cervical lordosis. 0.3 cm anterolisthesis C4 on C5 due to facet arthropathy. Skull base and vertebrae: No acute fracture. No primary bone lesion or focal pathologic process. Soft tissues and spinal canal: No prevertebral fluid or swelling. No visible canal hematoma. Disc levels: There is some loss of disc space height at C4-5. Small disc bulges are seen at C5-6 and C6-7. Upper chest: Lung apices clear. Other: None. IMPRESSION: No acute abnormality head or cervical spine. Chronic  microvascular change. Mild cervical spondylosis. Electronically Signed   By: Inge Rise M.D.   On: 08/17/2019 13:27   DG Chest Portable 1 View  Result Date: 08/17/2019 CLINICAL DATA:  Altered mental status for 2 days. EXAM: PORTABLE CHEST 1 VIEW COMPARISON:  Single-view of the chest 07/08/2009. FINDINGS:  Hazy airspace disease is seen bilaterally, most notable in the upper lung zones and left lung base. No pneumothorax or pleural effusion. Heart size is mildly enlarged. No acute or focal bony abnormality. IMPRESSION: Hazy bilateral airspace disease has an appearance most worrisome for pneumonia. Electronically Signed   By: Inge Rise M.D.   On: 08/17/2019 13:16   DG Hip Unilat W or Wo Pelvis 2-3 Views Left  Result Date: 08/17/2019 CLINICAL DATA:  Altered mental status. The patient refuses to ambulate. No known injury. EXAM: DG HIP (WITH OR WITHOUT PELVIS) 2-3V LEFT COMPARISON:  None. FINDINGS: There is no evidence of hip fracture or dislocation. There is no evidence of arthropathy or other focal bone abnormality. IMPRESSION: Negative exam. Electronically Signed   By: Inge Rise M.D.   On: 08/17/2019 13:15        Scheduled Meds: . vitamin C  500 mg Oral Daily  . dexamethasone  6 mg Oral Q24H  . heparin  5,000 Units Subcutaneous Q8H  . Ipratropium-Albuterol  1 puff Inhalation QID  . zinc sulfate  220 mg Oral Daily   Continuous Infusions: . cefTRIAXone (ROCEPHIN)  IV 1 g (08/19/19 0647)  . remdesivir 100 mg in NS 100 mL Stopped (08/18/19 0953)     LOS: 1 day   The patient is critically ill with multiple organ systems failure and requires high complexity decision making for assessment and support, frequent evaluation and titration of therapies, application of advanced monitoring technologies and extensive interpretation of multiple databases. Critical Care Time devoted to patient care services described in this note  Time spent: 40 minutes     Tkeyah Burkman, Geraldo Docker,  MD [facility] Pager [phone]  If 7PM-7AM, please contact night-coverage [email or web address] Password [facility] 08/19/2019, 7:59 AM

## 2019-08-19 NOTE — Progress Notes (Signed)
Called patients legal guardian, [name], and received no answer. Left voice message and awaiting callback. Will attempt to call again later in the shift.

## 2019-08-19 NOTE — Plan of Care (Signed)
PATIENT CARE PLAN  Problem: Coping: Goal: Psychosocial and spiritual needs will be supported Outcome: Progressing   Problem: Respiratory: Goal: Will maintain a patent airway Outcome: Progressing Goal: Complications related to the disease process, condition or treatment will be avoided or minimized Outcome: Progressing   Problem: Clinical Measurements: Goal: Ability to maintain clinical measurements within normal limits will improve Outcome: Progressing Goal: Will remain free from infection Outcome: Progressing Goal: Diagnostic test results will improve Outcome: Progressing Goal: Respiratory complications will improve Outcome: Progressing Goal: Cardiovascular complication will be avoided Outcome: Progressing   Problem: Activity: Goal: Risk for activity intolerance will decrease Outcome: Progressing   Problem: Nutrition: Goal: Adequate nutrition will be maintained Outcome: Progressing   Problem: Coping: Goal: Level of anxiety will decrease Outcome: Progressing   Problem: Elimination: Goal: Will not experience complications related to bowel motility Outcome: Progressing   Problem: Pain Managment: Goal: General experience of comfort will improve Outcome: Progressing   Problem: Safety: Goal: Ability to remain free from injury will improve Outcome: Progressing   Problem: Skin Integrity: Goal: Risk for impaired skin integrity will decrease Outcome: Progressing   Problem: Education: Goal: Knowledge of risk factors and measures for prevention of condition will improve Outcome: Not Progressing   Problem: Education: Goal: Knowledge of General Education information will improve Description: Including pain rating scale, medication(s)/side effects and non-pharmacologic comfort measures Outcome: Not Progressing   Problem: Health Behavior/Discharge Planning: Goal: Ability to manage health-related needs will improve Outcome: Not Progressing

## 2019-08-19 NOTE — Progress Notes (Signed)
Telemetry called and notified nurse that patients HR got down to 38 earlier during the shift. MD notified.

## 2019-08-19 NOTE — Progress Notes (Signed)
This note also relates to the following rows which could not be included: Pulse Rate - Cannot attach notes to unvalidated device data ECG Heart Rate - Cannot attach notes to unvalidated device data  Did vitals for patient 89 but wasn't put in so redone them for after 5am...Marland KitchenLDR

## 2019-08-19 NOTE — Progress Notes (Signed)
Spoke with patients granddaughter, [employer], and gave update on patients status, POC, and discharge plan. All questions answered at this time.

## 2019-08-20 ENCOUNTER — Inpatient Hospital Stay (HOSPITAL_COMMUNITY): Payer: Medicare HMO

## 2019-08-20 DIAGNOSIS — I351 Nonrheumatic aortic (valve) insufficiency: Secondary | ICD-10-CM

## 2019-08-20 DIAGNOSIS — I5042 Chronic combined systolic (congestive) and diastolic (congestive) heart failure: Secondary | ICD-10-CM

## 2019-08-20 DIAGNOSIS — I340 Nonrheumatic mitral (valve) insufficiency: Secondary | ICD-10-CM

## 2019-08-20 LAB — COMPREHENSIVE METABOLIC PANEL
ALT: 48 U/L — ABNORMAL HIGH (ref 0–44)
AST: 51 U/L — ABNORMAL HIGH (ref 15–41)
Albumin: 2.4 g/dL — ABNORMAL LOW (ref 3.5–5.0)
Alkaline Phosphatase: 72 U/L (ref 38–126)
Anion gap: 11 (ref 5–15)
BUN: 64 mg/dL — ABNORMAL HIGH (ref 8–23)
CO2: 19 mmol/L — ABNORMAL LOW (ref 22–32)
Calcium: 7.6 mg/dL — ABNORMAL LOW (ref 8.9–10.3)
Chloride: 114 mmol/L — ABNORMAL HIGH (ref 98–111)
Creatinine, Ser: 1.58 mg/dL — ABNORMAL HIGH (ref 0.44–1.00)
GFR calc Af Amer: 35 mL/min — ABNORMAL LOW (ref >60)
GFR calc non Af Amer: 30 mL/min — ABNORMAL LOW (ref >60)
Glucose, Bld: 289 mg/dL — ABNORMAL HIGH (ref 70–99)
Potassium: 3.8 mmol/L (ref 3.5–5.1)
Sodium: 144 mmol/L (ref 135–145)
Total Bilirubin: 0.5 mg/dL (ref 0.3–1.2)
Total Protein: 6.6 g/dL (ref 6.5–8.1)

## 2019-08-20 LAB — CBC WITH DIFFERENTIAL/PLATELET
Abs Immature Granulocytes: 0.5 10*3/uL — ABNORMAL HIGH (ref 0.00–0.07)
Basophils Absolute: 0.1 10*3/uL (ref 0.0–0.1)
Basophils Relative: 1 %
Eosinophils Absolute: 0 10*3/uL (ref 0.0–0.5)
Eosinophils Relative: 0 %
HCT: 31.6 % — ABNORMAL LOW (ref 36.0–46.0)
Hemoglobin: 10 g/dL — ABNORMAL LOW (ref 12.0–15.0)
Immature Granulocytes: 8 %
Lymphocytes Relative: 11 %
Lymphs Abs: 0.7 10*3/uL (ref 0.7–4.0)
MCH: 30.6 pg (ref 26.0–34.0)
MCHC: 31.6 g/dL (ref 30.0–36.0)
MCV: 96.6 fL (ref 80.0–100.0)
Monocytes Absolute: 0.4 10*3/uL (ref 0.1–1.0)
Monocytes Relative: 7 %
Neutro Abs: 4.6 10*3/uL (ref 1.7–7.7)
Neutrophils Relative %: 73 %
Platelets: 201 10*3/uL (ref 150–400)
RBC: 3.27 MIL/uL — ABNORMAL LOW (ref 3.87–5.11)
RDW: 13.5 % (ref 11.5–15.5)
WBC: 6.3 10*3/uL (ref 4.0–10.5)
nRBC: 0 % (ref 0.0–0.2)

## 2019-08-20 LAB — ECHOCARDIOGRAM COMPLETE

## 2019-08-20 LAB — C-REACTIVE PROTEIN: CRP: 2.7 mg/dL — ABNORMAL HIGH (ref <1.0)

## 2019-08-20 LAB — PHOSPHORUS: Phosphorus: 2.7 mg/dL (ref 2.5–4.6)

## 2019-08-20 LAB — MAGNESIUM: Magnesium: 2.3 mg/dL (ref 1.7–2.4)

## 2019-08-20 LAB — D-DIMER, QUANTITATIVE: D-Dimer, Quant: 0.7 ug/mL-FEU — ABNORMAL HIGH (ref 0.00–0.50)

## 2019-08-20 LAB — FERRITIN: Ferritin: 831 ng/mL — ABNORMAL HIGH (ref 11–307)

## 2019-08-20 MED ORDER — RISPERIDONE 0.5 MG PO TBDP
0.5000 mg | ORAL_TABLET | Freq: Every day | ORAL | Status: DC
  Administered 2019-08-22 – 2019-08-24 (×3): 0.5 mg via ORAL
  Filled 2019-08-20 (×4): qty 1

## 2019-08-20 MED ORDER — MIRTAZAPINE 15 MG PO TABS
7.5000 mg | ORAL_TABLET | Freq: Every day | ORAL | Status: DC
  Administered 2019-08-20 – 2019-08-23 (×4): 7.5 mg via ORAL
  Filled 2019-08-20 (×4): qty 1

## 2019-08-20 MED ORDER — AMLODIPINE BESYLATE 5 MG PO TABS
2.5000 mg | ORAL_TABLET | Freq: Every day | ORAL | Status: DC
  Administered 2019-08-20 – 2019-08-22 (×3): 2.5 mg via ORAL
  Filled 2019-08-20 (×3): qty 1

## 2019-08-20 MED ORDER — RISPERIDONE 0.5 MG PO TBDP
0.5000 mg | ORAL_TABLET | Freq: Every day | ORAL | Status: DC
  Filled 2019-08-20 (×2): qty 1

## 2019-08-20 MED ORDER — RISPERIDONE 0.5 MG PO TBDP
0.7500 mg | ORAL_TABLET | Freq: Every day | ORAL | Status: DC
  Administered 2019-08-21 – 2019-08-23 (×3): 0.75 mg via ORAL
  Filled 2019-08-20 (×4): qty 1.5

## 2019-08-20 MED ORDER — SODIUM CHLORIDE 0.9 % IV SOLN
100.0000 mg | Freq: Every day | INTRAVENOUS | Status: AC
  Administered 2019-08-21: 09:00:00 100 mg via INTRAVENOUS
  Filled 2019-08-20: qty 20

## 2019-08-20 MED ORDER — RISPERIDONE 0.5 MG PO TBDP
0.7500 mg | ORAL_TABLET | Freq: Every day | ORAL | Status: DC
  Filled 2019-08-20: qty 1.5

## 2019-08-20 MED ORDER — DONEPEZIL HCL 10 MG PO TABS
5.0000 mg | ORAL_TABLET | Freq: Every day | ORAL | Status: DC
  Administered 2019-08-20 – 2019-08-23 (×4): 5 mg via ORAL
  Filled 2019-08-20 (×4): qty 1

## 2019-08-20 MED ORDER — CEPHALEXIN 250 MG PO CAPS
250.0000 mg | ORAL_CAPSULE | Freq: Three times a day (TID) | ORAL | Status: AC
  Administered 2019-08-21 – 2019-08-22 (×6): 250 mg via ORAL
  Filled 2019-08-20 (×7): qty 1

## 2019-08-20 MED ORDER — LOSARTAN POTASSIUM 25 MG PO TABS
50.0000 mg | ORAL_TABLET | Freq: Every day | ORAL | Status: DC
  Administered 2019-08-20 – 2019-08-24 (×5): 50 mg via ORAL
  Filled 2019-08-20 (×5): qty 2

## 2019-08-20 MED ORDER — RISPERIDONE 0.5 MG PO TABS
0.7500 mg | ORAL_TABLET | Freq: Once | ORAL | Status: AC
  Administered 2019-08-20: 0.75 mg via ORAL
  Filled 2019-08-20: qty 1

## 2019-08-20 MED ORDER — RISPERIDONE 0.5 MG PO TBDP
0.5000 mg | ORAL_TABLET | Freq: Every day | ORAL | Status: DC
  Filled 2019-08-20: qty 1

## 2019-08-20 MED ORDER — LORAZEPAM 0.5 MG PO TABS: 0.5000 mg | ORAL_TABLET | Freq: Two times a day (BID) | ORAL | Status: DC | PRN

## 2019-08-20 NOTE — Plan of Care (Signed)
  Problem: Coping: Goal: Psychosocial and spiritual needs will be supported Outcome: Progressing   Problem: Respiratory: Goal: Will maintain a patent airway Outcome: Progressing   

## 2019-08-20 NOTE — Evaluation (Signed)
Physical Therapy Evaluation Patient Details Name: Leah Waller MRN: [id number] DOB: 1935/06/22 Today's Date: 08/20/2019   History of Present Illness  85 y.o. female admitted on 08/18/19 for fall and weakness.  Found to have COVID 19 PNA.  CT of head and c-spine negative for fx, x-ray of L hip/pelvis also negative for fx. Other dx include acute metabolic encepholopathy superimposed on baseline Alzheimer's dementia, AKI, acute cystitis (positive e-coli), dehydration with hypernatremia, bradycardia.  Pt with other significant PMH of HTN, bipolar d/o, CKD III, chronic systolic and diastolic CHF.   Clinical Impression  Pt seen for ambulation and she was able to walk a short distance from the chair to the door in her room and back to bed with the support of the therapist and RW.  She needs hands on support anytime she is standing as she is very unsteady (even with the support of the RW).  She is unable to provide an accurate description of her PLOF and her granddaughter, Leah Waller, who staff have been updating did not answer the phone.  We will need to talk with the family about PLOF and home situation as well as their thoughts on d/c plans.  She is appropriate for SNF level rehab at discharge.   PT to follow acutely for deficits listed below.      Follow Up Recommendations SNF;Supervision/Assistance - 24 hour    Equipment Recommendations  Rolling walker with 5" wheels;3in1 (PT)    Recommendations for Other Services   NA     Precautions / Restrictions Precautions Precautions: Fall Precaution Comments: reportedly increased falls at home      Mobility  Bed Mobility Overal bed mobility: Needs Assistance Bed Mobility: Sit to Supine       Sit to supine: Mod assist   General bed mobility comments: Mod assist to help lift both legs back into bed.   Transfers Overall transfer level: Needs assistance Equipment used: Rolling walker (2 wheeled) Transfers: Sit to/from Stand Sit to Stand: Min  assist         General transfer comment: Heavy min assist to stand from elevated recliner and lower BSC.   Ambulation/Gait Ambulation/Gait assistance: Min assist Gait Distance (Feet): 20 Feet Assistive device: Rolling walker (2 wheeled) Gait Pattern/deviations: Step-through pattern;Shuffle;Staggering left;Staggering right     General Gait Details: Even with RW pt with staggering gait pattern, hand over hand assist for placement of hands on RW handles, pt very unsteady even with support of the RW.        Balance Overall balance assessment: Needs assistance Sitting-balance support: Feet supported;Bilateral upper extremity supported Sitting balance-Leahy Scale: Fair     Standing balance support: Bilateral upper extremity supported Standing balance-Leahy Scale: Poor Standing balance comment: needs AD and support from therapist.                              Pertinent Vitals/Pain Pain Assessment: No/denies pain    Home Living Family/patient expects to be discharged to:: Private residence Living Arrangements: Alone Available Help at Discharge: Family;Available PRN/intermittently             Additional Comments: Pt is a poor historian, and primary family contact did not answer when I called    Prior Function Level of Independence: Independent         Comments: per pt report she walks unassisted without a device, she reports she still drives.      Hand Dominance  Dominant Hand: Right    Extremity/Trunk Assessment   Upper Extremity Assessment Upper Extremity Assessment: Defer to OT evaluation    Lower Extremity Assessment Lower Extremity Assessment: Generalized weakness    Cervical / Trunk Assessment Cervical / Trunk Assessment: Kyphotic  Communication   Communication: No difficulties  Cognition Arousal/Alertness: Awake/alert Behavior During Therapy: Flat affect Overall Cognitive Status: History of cognitive impairments - at baseline                                  General Comments: No one to determine if current affect and presentation is normal, but baseline Alzheimer's dementia.       General Comments General comments (skin integrity, edema, etc.): HR in the 50s, BP mildly elevated, O2 sats stable on RA in the 90s during mobility.          Assessment/Plan    PT Assessment Patient needs continued PT services  PT Problem List Decreased strength;Decreased activity tolerance;Decreased balance;Decreased mobility;Decreased cognition;Decreased knowledge of use of DME;Decreased safety awareness;Decreased knowledge of precautions;Cardiopulmonary status limiting activity       PT Treatment Interventions DME instruction;Gait training;Functional mobility training;Stair training;Therapeutic activities;Therapeutic exercise;Balance training;Neuromuscular re-education;Patient/family education;Cognitive remediation    PT Goals (Current goals can be found in the Care Plan section)  Acute Rehab PT Goals Patient Stated Goal: pt wanted to use the [facility] PT Goal Formulation: Patient unable to participate in goal setting Time For Goal Achievement: 09/03/19 Potential to Achieve Goals: Good    Frequency Min 3X/week           AM-PAC PT "6 Clicks" Mobility  Outcome Measure Help needed turning from your back to your side while in a flat bed without using bedrails?: A Lot Help needed moving from lying on your back to sitting on the side of a flat bed without using bedrails?: A Little Help needed moving to and from a bed to a chair (including a wheelchair)?: A Little Help needed standing up from a chair using your arms (e.g., wheelchair or bedside chair)?: A Little Help needed to walk in hospital room?: A Little Help needed climbing 3-5 steps with a railing? : A Lot 6 Click Score: 16    End of Session Equipment Utilized During Treatment: Gait belt Activity Tolerance: Patient limited by fatigue Patient left: in bed;with  call bell/phone within reach;with bed alarm set Nurse Communication: Mobility status PT Visit Diagnosis: Muscle weakness (generalized) (M62.81);Difficulty in walking, not elsewhere classified (R26.2);Repeated falls (R29.6)    Time: [id number] PT Time Calculation (min) (ACUTE ONLY): 32 min   Charges:         Verdene Lennert, PT, DPT  Acute Rehabilitation [phone] pager #[phone] office  @ [facility]: [phone]   PT Evaluation $PT Eval Moderate Complexity: 1 Mod PT Treatments $Gait Training: 8-22 mins      08/20/2019, 5:40 PM

## 2019-08-20 NOTE — Progress Notes (Signed)
RN notified of VS

## 2019-08-20 NOTE — Progress Notes (Addendum)
PROGRESS NOTE    Larimar Pepitone  [id number] DOB: 01-18-1935 DOA: 08/18/2019 PCP: Orvis Brill, [facility]   Brief Narrative:  Dianara Forsey is a 85 y.o. BF PMHx Dementia, Bipolar, Anxiety, HTN, iron deficiency anemia, CKD stage III  Presents with fall and weakness.  Patient has dementia, and is unable to provide accurate medical history. I called her daughter who also dose not know what happened to patient in detail since that she is not living with patient, therefore, most of the history is obtained by discussing the case with ED physician, per EMS report, and with the nursing staff.  Per report, pt was found to have decreased level of alertness, generalized weakness.  Normally she is ambulatory and conversant, however at bedside notes that patient has been more confused and somnolent for the past 2 days. She is unable to stand or ambulate.  Has decreased oral intake. Patient has had multiple falls over the last week or 2.They note that she appears to stop moving her left leg since yesterday. Patient indicates some throat discomfort but unable to describe any other specific symptoms.  Does not seem to have chest pain or abdominal pain.  No active nausea vomiting, diarrhea noted.  No active cough noted.  ED Course: pt was found to have positive Covid 19 Ag test, WBC 7.0, worsening renal function, temperature normal, blood pressure 123/61, heart rate 63, RR 25, oxygen saturation 93-95% on RA, chest x-ray showed bilateral infiltration.  CT of the head is negative for acute intracranial abnormalities.  CT of C-spine is negative for bony fracture.  X-ray of left hip/pelvis is negative for bony fracture.  Patient is admitted to [facility] bed as inpatient.   Subjective: 2/19 afebrile overnight A/O x1 (does not know where, when, why).  Negative CP, negative S OB   Assessment & Plan:   Principal Problem:   Pneumonia due to COVID-19 virus Active Problems:   Alzheimer's  dementia with behavioral disturbance ([place])   Essential hypertension   CKD (chronic kidney disease) stage 3, GFR 30-59 ml/min   COVID-19 virus infection   Acute metabolic encephalopathy   Frequent falls   Acute renal failure superimposed on stage 3a chronic kidney disease (HCC)   Acute respiratory disease due to COVID-19 virus   Dehydration with hypernatremia   Acute cystitis   DNR (do not resuscitate)   Dementia without behavioral disturbance ([place])   Bipolar 1 disorder ([place])   Pneumonia due to COVID-19 virus -Decadron 6 mg daily -Remdesivir per pharmacy protocol -Vitamins per Covid protocol -Combivent -Titrate O2 to maintain SPO2 > 88% -Prone patient 16 hours/day; if cannot tolerate prone patient 2 to 3 hours per shift.  Acute metabolic encephalopathy -Likely due to AKI and covid-19.  -DC all sedating medication  Chronic systolic and diastolic CHF -[id number] echocardiogram consistent with above diagnosis see results below -Strict in and out -Daily weight  Acute on CKD stage IIIa (baseline Cr 1.73) Recent Labs  Lab 08/17/19 1201 08/18/19 0520 08/19/19 0224 08/20/19 0025  CREATININE 3.09* 2.10*  2.08* 2.01* 1.58*  -2/18 change to D5 W 75 ml/hr -2/18 2 amp sodium bicarb -Better than baseline.  Acute cystitis positive E. Coli -UA suggestive of acute cystitis. -Complete 5-day course antibiotics  Dehydration with hypernatremia -Resolved -See acute on CKD stage III.  Essential hypertension -Hold BP meds due to dehydration.  -Holding ARB due to AKI.  Frequent falls -2/18 PT/OT consult A/O x1 (does not know where, when, why).  States lives alone.  Evaluate for SNF   Bradycardia -Possible cause of her frequent falls? -2/19 echocardiogram; consistent with systolic and diastolic CHF -Discussed case with Dr. Thompson Grayer, EP who feels that most likely secondary to patient's acute Covid illness will follow-up with a virtual visit with patient and a couple weeks post  discharge.  Alzheimer's dementia with behavioral disturbance ([place]) -Chronic Will need updated MAR from [facility].   Goals of care -2/19 PT/OT consult eval for discharge to SNF  Vs home health on 2/20       DVT prophylaxis: Heparin subcu Code Status: DNR Family Communication:  2/19 spoke with Otila Kluver (granddaughter) counseled on plan of care answered all questions.  Disposition Plan:    Consultants:  Phone consult; Discussed case with Dr. Thompson Grayer, EP    Procedures/Significant Events:  2/19 echocardiogram;Left Ventricle: EF =45 to 50%.-The left ventricle demonstrates global hypokinesis. -Grade I diastolic dysfunction (impaired relaxation).  -Pericardium: A small pericardial effusion is present. The pericardial effusion is circumferential. Presence of pericardial fat pad.     I have personally reviewed and interpreted all radiology studies and my findings are as above.  VENTILATOR SETTINGS: Room air 2/19  SPO2; 97%   Cultures 2/16 blood right AC NGTD 2/16 blood LEFT hand NGTD 2/16 urine positive E. Coli 2/17 urine positive E. coli   Antimicrobials: Anti-infectives (From admission, onward)   Start     Dose/Rate Stop   08/18/19 1000  remdesivir 100 mg in sodium chloride 0.9 % 100 mL IVPB     100 mg 200 mL/hr over 30 Minutes 08/22/19 0959   08/18/19 0600  cefTRIAXone (ROCEPHIN) 1 g in sodium chloride 0.9 % 100 mL IVPB     1 g 200 mL/hr over 30 Minutes 08/23/19 0559       Devices    LINES / TUBES:     Continuous Infusions: . cefTRIAXone (ROCEPHIN)  IV 1 g (08/20/19 [id number])  . dextrose Stopped (08/20/19 0603)  . remdesivir 100 mg in NS 100 mL Stopped (08/19/19 1931)     Objective: Vitals:   08/19/19 2105 08/20/19 0210 08/20/19 0405 08/20/19 0755  BP:  (!) 158/63 (!) 145/85 (!) 157/58  Pulse: (!) 52 (!) 49 (!) 43   Resp: 19 19 15    Temp:   (!) 97.4 F (36.3 C) 97.6 F (36.4 C)  TempSrc:   Oral Oral  SpO2: 94% 96% 97%     Intake/Output  Summary (Last 24 hours) at 08/20/2019 0830 Last data filed at 08/20/2019 [id number] Gross per 24 hour  Intake 275 ml  Output 700 ml  Net -425 ml   There were no vitals filed for this visit.  Physical Exam:  General: A/O x1 (does not know where, when, why) no acute respiratory distress Eyes: negative scleral hemorrhage, negative anisocoria, negative icterus ENT: Negative Runny nose, negative gingival bleeding, Neck:  Negative scars, masses, torticollis, lymphadenopathy, JVD Lungs: Clear to auscultation bilaterally without wheezes or crackles Cardiovascular: Regular rate and rhythm without murmur gallop or rub normal S1 and S2 Abdomen: negative abdominal pain, nondistended, positive soft, bowel sounds, no rebound, no ascites, no appreciable mass Extremities: No significant cyanosis, clubbing, or edema bilateral lower extremities Skin: Negative rashes, lesions, ulcers Psychiatric: Cannot fully assess secondary to dementia Central nervous system:  Cranial nerves II through XII intact, tongue/uvula midline, all extremities muscle strength 5/5, sensation intact throughout, negative dysarthria, negative expressive aphasia, negative receptive aphasia. .     Data Reviewed: Care during the described time interval was provided  by me .  I have reviewed this patient's available data, including medical history, events of note, physical examination, and all test results as part of my evaluation.   CBC: Recent Labs  Lab 08/17/19 1201 08/18/19 0520 08/19/19 0224 08/20/19 0025  WBC 7.0 5.1 5.1 6.3  NEUTROABS  --  4.1 3.6 4.6  HGB 11.4* 11.2* 10.1* 10.0*  HCT 36.4 36.2 32.5* 31.6*  MCV 98.4 98.6 98.5 96.6  PLT 176 171 197 [id number]   Basic Metabolic Panel: Recent Labs  Lab 08/17/19 1201 08/18/19 0520 08/19/19 0224 08/20/19 0025  NA 147* 142 147* 144  K 4.8 4.7 4.5 3.8  CL 114* 114* 120* 114*  CO2 20* 17* 16* 19*  GLUCOSE 121* 135* 157* 289*  BUN 62* 59* 68* 64*  CREATININE 3.09* 2.10*  2.08*  2.01* 1.58*  CALCIUM 8.3* 8.0* 7.8* 7.6*  MG  --  2.6* 2.4 2.3  PHOS  --  4.7* 4.2 2.7   GFR: CrCl cannot be calculated (Unknown ideal weight.). Liver Function Tests: Recent Labs  Lab 08/17/19 1201 08/18/19 0520 08/19/19 0224 08/20/19 0025  AST 26 24 20  51*  ALT 16 17 14  48*  ALKPHOS 79 80 71 72  BILITOT 0.7 0.4 0.1* 0.5  PROT 7.9 7.8 6.8 6.6  ALBUMIN 2.9* 2.8* 2.4* 2.4*   Recent Labs  Lab 08/17/19 1201  LIPASE 33   No results for input(s): AMMONIA in the last 168 hours. Coagulation Profile: No results for input(s): INR, PROTIME in the last 168 hours. Cardiac Enzymes: No results for input(s): CKTOTAL, CKMB, CKMBINDEX, TROPONINI in the last 168 hours. BNP (last 3 results) No results for input(s): PROBNP in the last 8760 hours. HbA1C: No results for input(s): HGBA1C in the last 72 hours. CBG: No results for input(s): GLUCAP in the last 168 hours. Lipid Profile: Recent Labs    08/17/19 1909  TRIG 104   Thyroid Function Tests: No results for input(s): TSH, T4TOTAL, FREET4, T3FREE, THYROIDAB in the last 72 hours. Anemia Panel: Recent Labs    08/19/19 0224 08/20/19 0025  FERRITIN 1,026* 831*   Urine analysis:    Component Value Date/Time   COLORURINE YELLOW (A) 08/17/2019 1909   APPEARANCEUR HAZY (A) 08/17/2019 1909   LABSPEC 1.015 08/17/2019 1909   PHURINE 5.0 08/17/2019 1909   GLUCOSEU NEGATIVE 08/17/2019 1909   HGBUR NEGATIVE 08/17/2019 1909   BILIRUBINUR NEGATIVE 08/17/2019 [place] NEGATIVE 08/17/2019 1909   PROTEINUR 30 (A) 08/17/2019 1909   NITRITE POSITIVE (A) 08/17/2019 1909   LEUKOCYTESUR MODERATE (A) 08/17/2019 1909   Sepsis Labs: @LABRCNTIP (procalcitonin:4,lacticidven:4)  ) Recent Results (from the past 240 hour(s))  Culture, blood (Routine X 2) w Reflex to ID Panel     Status: None (Preliminary result)   Collection Time: 08/17/19  7:08 PM   Specimen: BLOOD  Result Value Ref Range Status   Specimen Description BLOOD RIGHT  ANTECUBITAL  Final   Special Requests   Final    BOTTLES DRAWN AEROBIC AND ANAEROBIC Blood Culture adequate volume   Culture   Final    NO GROWTH 3 DAYS Performed at [facility], [address] [place], [place] [address]    Report Status PENDING  Incomplete  Urine culture     Status: Abnormal (Preliminary result)   Collection Time: 08/17/19  7:09 PM   Specimen: Urine, Random  Result Value Ref Range Status   Specimen Description   Final    URINE, RANDOM Performed at [facility],  [place], [place] [address]    Special Requests   Final    NONE Performed at [facility], [address] [place], [place] [address]    Culture (A)  Final    >=100,000 COLONIES/mL ESCHERICHIA COLI SUSCEPTIBILITIES TO FOLLOW Performed at [facility], [place] [address], [place], [place] [address]    Report Status PENDING  Incomplete  Culture, blood (Routine X 2) w Reflex to ID Panel     Status: None (Preliminary result)   Collection Time: 08/17/19  7:09 PM   Specimen: BLOOD  Result Value Ref Range Status   Specimen Description BLOOD BLOOD LEFT HAND  Final   Special Requests   Final    BOTTLES DRAWN AEROBIC AND ANAEROBIC Blood Culture adequate volume   Culture   Final    NO GROWTH 3 DAYS Performed at [facility], [address] [place], [place] [address]    Report Status PENDING  Incomplete  Culture, Urine     Status: Abnormal (Preliminary result)   Collection Time: 08/18/19  9:12 AM   Specimen: Urine, Clean Catch  Result Value Ref Range Status   Specimen Description   Final    URINE, CLEAN CATCH Performed at [facility], [place] [address] [place], [place] [address]    Special Requests   Final    NONE Performed at [facility], [place] [address], [place],  [address]    Culture (A)  Final    50,000 COLONIES/mL ESCHERICHIA COLI SUSCEPTIBILITIES TO FOLLOW CULTURE REINCUBATED FOR BETTER  GROWTH Performed at [facility], [place] [address], [place],  [address]    Report Status PENDING  Incomplete         Radiology Studies: No results found.      Scheduled Meds: . vitamin C  500 mg Oral Daily  . dexamethasone  6 mg Oral Q24H  . heparin  5,000 Units Subcutaneous Q8H  . Ipratropium-Albuterol  1 puff Inhalation QID  . zinc sulfate  220 mg Oral Daily   Continuous Infusions: . cefTRIAXone (ROCEPHIN)  IV 1 g (08/20/19 [id number])  . dextrose Stopped (08/20/19 0603)  . remdesivir 100 mg in NS 100 mL Stopped (08/19/19 1931)     LOS: 2 days   The patient is critically ill with multiple organ systems failure and requires high complexity decision making for assessment and support, frequent evaluation and titration of therapies, application of advanced monitoring technologies and extensive interpretation of multiple databases. Critical Care Time devoted to patient care services described in this note  Time spent: 40 minutes     Dayanis Bergquist, Geraldo Docker, MD [facility] Pager [phone]  If 7PM-7AM, please contact night-coverage [email or web address] Password [email or web address] 08/20/2019, 8:30 AM

## 2019-08-20 NOTE — Consult Note (Signed)
   [facility] CM Inpatient Consult   08/20/2019  Leah Waller [facility] 09-07-1934 [id number]   Patient screened for high risk score for unplanned readmission score and showing in the Leah Waller with [facility].     Review of patient's medical record briefly reveals patient is a resident at [facility] and admitted with COVID-19 and per MD progress notes 08/19/2019 which includes but not limited to:  Leah Waller is a 85 y.o. BF PMHx Dementia, Bipolar, Anxiety, HTN, iron deficiency anemia, CKD stage III  Primary Care Provider is  [facility], is not a [facility] provider.   Plan:  No [facility] Care Management needs identified. Will sign off.  Patient is a resident in a facility. Patient will receive services needed in the facility as appropriate for disposition.  For questions contact:   Natividad Brood, RN BSN [facility] Liaison  [phone] business mobile phone Toll free office [phone]  Fax number: [phone] [email or web address]

## 2019-08-20 NOTE — Progress Notes (Signed)
Spoke with patients granddaughter, [employer], and gave update on patients status, POC, and discharge plan. All questions answered at this time.

## 2019-08-20 NOTE — Progress Notes (Signed)
Ok to change ceftriaxone to Keflex to complete a total of 5 days for pan sensitive e.coli per Dr. Sherral Hammers.  Onnie Boer, PharmD, BCIDP, AAHIVP, CPP Infectious Disease Pharmacist 08/20/2019 6:23 PM

## 2019-08-20 NOTE — Progress Notes (Signed)
Page sent to Dr. Vanita Ingles concerning patient's HR dropping to 38. HR has been in the 40s-50s most of this shift. Also had a 2.07 & 2.09 second pauses.

## 2019-08-20 NOTE — Progress Notes (Signed)
RN notified of MEWS

## 2019-08-20 NOTE — Progress Notes (Signed)
Spoke with patients daughter, Marinus Maw, and gave update on patients status, POC, and discharge plan. Questions answered at that time. Patient to discharge tomorrow to [facility].

## 2019-08-20 NOTE — Progress Notes (Signed)
   08/20/19 1946  Vitals  Temp (!) 94.2 F (34.6 C)  Temp Source Axillary  BP (!) 181/78  BP Location Left Arm  BP Method Automatic  Patient Position (if appropriate) Lying  Pulse Rate (!) 53  Pulse Rate Source Monitor  Resp 19  Level of Consciousness  Level of Consciousness Alert  sent message to Dr Vanita Ingles concerning pt vitals. 125, [place] pt temp 94.2 axillary unable to get oral. pt warm to touch and alert. BP 181/78 hr 53

## 2019-08-20 NOTE — Progress Notes (Signed)
  Echocardiogram 2D Echocardiogram has been performed.  Dustine Stickler A Evalisse Prajapati 08/20/2019, 9:35 AM

## 2019-08-21 DIAGNOSIS — J069 Acute upper respiratory infection, unspecified: Secondary | ICD-10-CM

## 2019-08-21 DIAGNOSIS — R001 Bradycardia, unspecified: Secondary | ICD-10-CM

## 2019-08-21 LAB — COMPREHENSIVE METABOLIC PANEL
ALT: 48 U/L — ABNORMAL HIGH (ref 0–44)
AST: 36 U/L (ref 15–41)
Albumin: 2.5 g/dL — ABNORMAL LOW (ref 3.5–5.0)
Alkaline Phosphatase: 68 U/L (ref 38–126)
Anion gap: 11 (ref 5–15)
BUN: 56 mg/dL — ABNORMAL HIGH (ref 8–23)
CO2: 19 mmol/L — ABNORMAL LOW (ref 22–32)
Calcium: 7.6 mg/dL — ABNORMAL LOW (ref 8.9–10.3)
Chloride: 111 mmol/L (ref 98–111)
Creatinine, Ser: 1.27 mg/dL — ABNORMAL HIGH (ref 0.44–1.00)
GFR calc Af Amer: 45 mL/min — ABNORMAL LOW (ref >60)
GFR calc non Af Amer: 39 mL/min — ABNORMAL LOW (ref >60)
Glucose, Bld: 235 mg/dL — ABNORMAL HIGH (ref 70–99)
Potassium: 4.1 mmol/L (ref 3.5–5.1)
Sodium: 141 mmol/L (ref 135–145)
Total Bilirubin: 0.4 mg/dL (ref 0.3–1.2)
Total Protein: 6.5 g/dL (ref 6.5–8.1)

## 2019-08-21 LAB — CBC WITH DIFFERENTIAL/PLATELET
Abs Immature Granulocytes: 0.78 10*3/uL — ABNORMAL HIGH (ref 0.00–0.07)
Basophils Absolute: 0.1 10*3/uL (ref 0.0–0.1)
Basophils Relative: 1 %
Eosinophils Absolute: 0 10*3/uL (ref 0.0–0.5)
Eosinophils Relative: 0 %
HCT: 31.5 % — ABNORMAL LOW (ref 36.0–46.0)
Hemoglobin: 10 g/dL — ABNORMAL LOW (ref 12.0–15.0)
Immature Granulocytes: 11 %
Lymphocytes Relative: 10 %
Lymphs Abs: 0.7 10*3/uL (ref 0.7–4.0)
MCH: 30.5 pg (ref 26.0–34.0)
MCHC: 31.7 g/dL (ref 30.0–36.0)
MCV: 96 fL (ref 80.0–100.0)
Monocytes Absolute: 0.4 10*3/uL (ref 0.1–1.0)
Monocytes Relative: 6 %
Neutro Abs: 4.9 10*3/uL (ref 1.7–7.7)
Neutrophils Relative %: 72 %
Platelets: 227 10*3/uL (ref 150–400)
RBC: 3.28 MIL/uL — ABNORMAL LOW (ref 3.87–5.11)
RDW: 13.2 % (ref 11.5–15.5)
WBC: 6.9 10*3/uL (ref 4.0–10.5)
nRBC: 0.3 % — ABNORMAL HIGH (ref 0.0–0.2)

## 2019-08-21 LAB — URINE CULTURE: Culture: 50000 — AB

## 2019-08-21 LAB — C-REACTIVE PROTEIN: CRP: 2.2 mg/dL — ABNORMAL HIGH (ref <1.0)

## 2019-08-21 LAB — FERRITIN: Ferritin: 658 ng/mL — ABNORMAL HIGH (ref 11–307)

## 2019-08-21 LAB — D-DIMER, QUANTITATIVE: D-Dimer, Quant: 0.59 ug/mL-FEU — ABNORMAL HIGH (ref 0.00–0.50)

## 2019-08-21 LAB — MAGNESIUM: Magnesium: 2.3 mg/dL (ref 1.7–2.4)

## 2019-08-21 LAB — PHOSPHORUS: Phosphorus: 3.5 mg/dL (ref 2.5–4.6)

## 2019-08-21 MED ORDER — CEPHALEXIN 250 MG PO CAPS: 250.0000 mg | ORAL_CAPSULE | Freq: Three times a day (TID) | ORAL | 0 refills | Status: DC

## 2019-08-21 MED ORDER — DEXAMETHASONE 6 MG PO TABS: 6.0000 mg | ORAL_TABLET | ORAL | 0 refills | Status: DC

## 2019-08-21 MED ORDER — IPRATROPIUM-ALBUTEROL 20-100 MCG/ACT IN AERS: 1.0000 | INHALATION_SPRAY | Freq: Four times a day (QID) | RESPIRATORY_TRACT | 0 refills | Status: DC

## 2019-08-21 MED ORDER — ONDANSETRON HCL 4 MG PO TABS: 4.0000 mg | ORAL_TABLET | Freq: Four times a day (QID) | ORAL | 0 refills | Status: DC | PRN

## 2019-08-21 MED ORDER — ASCORBIC ACID 500 MG PO TABS: 500.0000 mg | ORAL_TABLET | Freq: Every day | ORAL | 0 refills | Status: DC

## 2019-08-21 MED ORDER — ZINC SULFATE 220 (50 ZN) MG PO CAPS: 220.0000 mg | ORAL_CAPSULE | Freq: Every day | ORAL | 0 refills | Status: DC

## 2019-08-21 NOTE — TOC Initial Note (Signed)
Transition of Care [facility]) - Initial/Assessment Note    Patient Details  Name: Leah Waller MRN: [id number] Date of Birth: 1935/01/20  Transition of Care [facility]) CM/SW Contact:    Leah Ochs, LCSW Phone Number: 08/21/2019, 10:29 AM  Clinical Narrative:     CSW alerted by RN this morning that patient has discharge order to SNF, but no workup had been initiated. TOC consult was never placed for patient to be worked up. CSW reviewed chart and contacted family. CSW attempted to contact Leah Waller, who did not answer and unable to leave a voicemail. CSW spoke with Leah Waller, discussed that patient was admitted from [facility], and Leah Waller is unaware of patient's prior level of function as they have not been able to see the patient for quite some time. Leah Waller deferred discharge planning discussion to patient's daughter, Leah Waller.  CSW called Leah Waller back and discussed patient's current functioning with PT and concerns that ALF cannot provide level of assist that the patient needs at this time. Leah Waller in agreement, and asked about SNF placement in [place]. Due to patient's COVID status, no SNFs in [place] are currently accepting COVID patients, closest SNF would be [facility] Leah Waller in agreement. CSW completed referral and sent to [facility] for review.   Patient will need prior authorization with [facility], who is closed on the weekend, and patient's PASRR is under manual review due to patient's Bipolar I and Alzheimer's diagnoses. Patient will be unable to discharge to SNF until Monday, at the earliest, pending authorization and PASRR. CSW to follow.           Expected Discharge Plan: Skilled Nursing Facility Barriers to Discharge: Ship broker, Awaiting State Approval (PASRR)   Patient Goals and CMS Choice Patient states their goals for this hospitalization and ongoing recovery are:: patient unable to participate in goal setting due to disorientation CMS Medicare.gov Compare  Post Acute Care list provided to:: Patient Represenative (must comment) Choice offered to / list presented to : Adult Children  Expected Discharge Plan and Services Expected Discharge Plan: [place] Acute Care Choice: [place] arrangements for the past 2 months: [place] Expected Discharge Date: 08/21/19                                    Prior Living Arrangements/Services Living arrangements for the past 2 months: [place] Lives with:: Facility Resident Patient language and need for interpreter reviewed:: No Do you feel safe going back to the place where you live?: Yes      Need for Family Participation in Patient Care: Yes (Comment) Care giver support system in place?: No (comment)   Criminal Activity/Legal Involvement Pertinent to Current Situation/Hospitalization: No - Comment as needed  Activities of Daily Living Home Assistive Devices/Equipment: Other (Comment)(unknown by patient or family) ADL Screening (condition at time of admission) Patient's cognitive ability adequate to safely complete daily activities?: No Is the patient deaf or have difficulty hearing?: No Does the patient have difficulty seeing, even when wearing glasses/contacts?: No Does the patient have difficulty concentrating, remembering, or making decisions?: Yes Patient able to express need for assistance with ADLs?: Yes Does the patient have difficulty dressing or bathing?: Yes Independently performs ADLs?: No Communication: Needs assistance Is this a change from baseline?: Pre-admission baseline Does the patient have difficulty walking or climbing stairs?: Yes Weakness of Legs: Both Weakness  of Arms/Hands: None  Permission Sought/Granted Permission sought to share information with : Facility Sport and exercise psychologist, Family Supports Permission granted to share information with : Yes, Verbal Permission Granted  Share  Information with NAME: Leah Waller  Permission granted to share info w AGENCY: SNF, [place] granted to share info w Relationship: Daughter, Granddaughter     Emotional Assessment   Attitude/Demeanor/Rapport: Unable to Assess Affect (typically observed): Unable to Assess Orientation: : Oriented to Self Alcohol / Substance Use: Not Applicable Psych Involvement: No (comment)  Admission diagnosis:  COVID-19 virus infection [U07.1] Patient Active Problem List   Diagnosis Date Noted  . Dementia without behavioral disturbance ([place]) 08/18/2019  . Bipolar 1 disorder ([place]) 08/18/2019  . COVID-19 virus infection 08/17/2019  . Acute metabolic encephalopathy 0000000  . Frequent falls 08/17/2019  . Depression with anxiety 08/17/2019  . Iron deficiency anemia 08/17/2019  . Acute renal failure superimposed on stage 3a chronic kidney disease ([place]) 08/17/2019  . Acute respiratory disease due to COVID-19 virus 08/17/2019  . Pneumonia due to COVID-19 virus 08/17/2019  . Dehydration with hypernatremia 08/17/2019  . Acute cystitis 08/17/2019  . DNR (do not resuscitate) 08/17/2019  . Memory deficit 04/14/2018  . Essential hypertension 04/14/2018  . Hyperlipidemia 04/14/2018  . CKD (chronic kidney disease) stage 3, GFR 30-59 ml/min 04/14/2018  . Anxiety 04/14/2018  . Alzheimer's dementia with behavioral disturbance ([place]) 12/22/2017  . Low vitamin B12 level 10/11/2014   PCP:  [facility]:   Ardeth Perfect, [place] [id number] [place] [address] Phone: [phone] Fax: [phone]     Social Determinants of Health (SDOH) Interventions    Readmission Risk Interventions No flowsheet data found.

## 2019-08-21 NOTE — Progress Notes (Addendum)
Occupational Therapy Evaluation Patient Details Name: Leah Waller MRN: [id number] DOB: December 02, 1934 Today's Date: 08/21/2019    History of Present Illness 85 y.o. female admitted on 08/18/19 for fall and weakness.  Found to have COVID 19 PNA.  CT of head and c-spine negative for fx, x-ray of L hip/pelvis also negative for fx. Other dx include acute metabolic encepholopathy superimposed on baseline Alzheimer's dementia, AKI, acute cystitis (positive e-coli), dehydration with hypernatremia, bradycardia.  Pt with other significant PMH of HTN, bipolar d/o, CKD III, chronic systolic and diastolic CHF.    Clinical Impression   Patient in bed on arrival.  She was only oriented to self, though appears this is baseline.  Per RN patient is on memory care unit at an ALF.  Patient, though poor historian, states staff helps her with showering/toileting/eating.  She required mod assist with bed mobility and mod assist with transfer to [facility].  She takes very small steps and needed verbal and tactile cues.  Patient able to stand for short periods of time with walker support while therapist gave max assist for toileting and LE bath.  Required 5 rest breaks during.   Patient remained on room air throughout session and SpO2> 95.  Will continue to follow with OT acutely to address the deficits listed below.      Follow Up Recommendations  SNF;Supervision/Assistance - 24 hour    Equipment Recommendations       Recommendations for Other Services       Precautions / Restrictions Precautions Precautions: Fall Restrictions Weight Bearing Restrictions: No      Mobility Bed Mobility Overal bed mobility: Needs Assistance Bed Mobility: Supine to Sit     Supine to sit: Mod assist        Transfers Overall transfer level: Needs assistance Equipment used: Rolling walker (2 wheeled) Transfers: Sit to/from Stand Sit to Stand: Min assist         General transfer comment: Heavy min assist to stand  from elevated recliner and lower BSC.     Balance Overall balance assessment: Needs assistance Sitting-balance support: Feet supported;Bilateral upper extremity supported Sitting balance-Leahy Scale: Fair     Standing balance support: Bilateral upper extremity supported Standing balance-Leahy Scale: Poor Standing balance comment: needs AD and support from therapist.                            ADL either performed or assessed with clinical judgement   ADL Overall ADL's : Needs assistance/impaired Eating/Feeding: Minimal assistance;Sitting   Grooming: Minimal assistance;Sitting   Upper Body Bathing: Moderate assistance;Standing   Lower Body Bathing: Maximal assistance;Sit to/from stand   Upper Body Dressing : Minimal assistance;Sitting   Lower Body Dressing: Maximal assistance;Sit to/from stand   Toilet Transfer: Moderate assistance;Stand-pivot;RW;BSC   Toileting- Clothing Manipulation and Hygiene: Maximal assistance;Sit to/from stand       Functional mobility during ADLs: Moderate assistance;Rolling walker       Vision         Perception     Praxis      Pertinent Vitals/Pain Pain Assessment: No/denies pain     Hand Dominance Right   Extremity/Trunk Assessment Upper Extremity Assessment Upper Extremity Assessment: Generalized weakness           Communication Communication Communication: No difficulties   Cognition Arousal/Alertness: Lethargic Behavior During Therapy: Flat affect Overall Cognitive Status: History of cognitive impairments - at baseline  General Comments: No one to determine if current affect and presentation is normal, but baseline Alzheimer's dementia.    General Comments       Exercises     Shoulder Instructions      Home Living Family/patient expects to be discharged to:: Assisted living Living Arrangements: Alone                                Additional Comments: Pt is poor historian. RN reports she lives at an ALF      Prior Functioning/Environment Level of Independence: Needs assistance        Comments: Patient reports staff helps her with showers/dressing/eating        OT Problem List: Decreased strength;Decreased activity tolerance;Impaired balance (sitting and/or standing);Decreased cognition;Decreased safety awareness;Decreased knowledge of precautions;Cardiopulmonary status limiting activity      OT Treatment/Interventions: Self-care/ADL training;Therapeutic exercise;Energy conservation;Therapeutic activities;Cognitive remediation/compensation;Balance training;Patient/family education    OT Goals(Current goals can be found in the care plan section) Acute Rehab OT Goals Patient Stated Goal: pt wanted to use the [facility] OT Goal Formulation: With patient Time For Goal Achievement: 09/04/19 Potential to Achieve Goals: Good ADL Goals Pt Will Perform Grooming: with set-up;sitting Pt Will Perform Upper Body Bathing: with set-up;sitting Pt Will Perform Upper Body Dressing: with set-up;sitting Pt Will Transfer to Toilet: with supervision;ambulating  OT Frequency: Min 2X/week   Barriers to D/C:            Co-evaluation              AM-PAC OT "6 Clicks" Daily Activity     Outcome Measure Help from another person eating meals?: A Little Help from another person taking care of personal grooming?: A Little Help from another person toileting, which includes using toliet, bedpan, or urinal?: A Lot Help from another person bathing (including washing, rinsing, drying)?: A Lot Help from another person to put on and taking off regular upper body clothing?: A Little Help from another person to put on and taking off regular lower body clothing?: A Lot 6 Click Score: 15   End of Session Equipment Utilized During Treatment: Surveyor, mining Communication: Mobility status  Activity Tolerance: Patient limited by  fatigue Patient left: in chair;with call bell/phone within reach;with chair alarm set;with nursing/sitter in room  OT Visit Diagnosis: Unsteadiness on feet (R26.81);Muscle weakness (generalized) (M62.81);History of falling (Z91.81);Other symptoms and signs involving cognitive function                Time: [id number] OT Time Calculation (min): 55 min Charges:  OT General Charges $OT Visit: 1 Visit OT Evaluation $OT Eval Moderate Complexity: 1 Mod OT Treatments $Therapeutic Activity: 38-52 mins  August Luz, OTR/L    Phylliss Bob 08/21/2019, 1:26 PM

## 2019-08-21 NOTE — NC FL2 (Signed)
[place] LEVEL OF CARE SCREENING TOOL     IDENTIFICATION  Patient Name: Leah Waller Birthdate: [birth year] Sex: female Admission Date (Current Location): 08/18/2019  [facility] and [place] Number:  Engineering geologist and Address:  ([place])      Provider Number: [id number]  Attending Physician Name and Address:  Allie Bossier, MD  Relative Name and Phone Number:       Current Level of Care: Hospital Recommended Level of Care: [place] Prior Approval Number:    Date Approved/Denied:   PASRR Number: Manual review  Discharge Plan: SNF    Current Diagnoses: Patient Active Problem List   Diagnosis Date Noted  . Dementia without behavioral disturbance ([place]) 08/18/2019  . Bipolar 1 disorder ([place]) 08/18/2019  . COVID-19 virus infection 08/17/2019  . Acute metabolic encephalopathy 0000000  . Frequent falls 08/17/2019  . Depression with anxiety 08/17/2019  . Iron deficiency anemia 08/17/2019  . Acute renal failure superimposed on stage 3a chronic kidney disease ([place]) 08/17/2019  . Acute respiratory disease due to COVID-19 virus 08/17/2019  . Pneumonia due to COVID-19 virus 08/17/2019  . Dehydration with hypernatremia 08/17/2019  . Acute cystitis 08/17/2019  . DNR (do not resuscitate) 08/17/2019  . Memory deficit 04/14/2018  . Essential hypertension 04/14/2018  . Hyperlipidemia 04/14/2018  . CKD (chronic kidney disease) stage 3, GFR 30-59 ml/min 04/14/2018  . Anxiety 04/14/2018  . Alzheimer's dementia with behavioral disturbance ([place]) 12/22/2017  . Low vitamin B12 level 10/11/2014    Orientation RESPIRATION BLADDER Height & Weight     Self  Normal Incontinent Weight: 165 lb (74.8 kg) Height:     BEHAVIORAL SYMPTOMS/MOOD NEUROLOGICAL BOWEL NUTRITION STATUS      Incontinent Diet(see DC Summary)  AMBULATORY STATUS COMMUNICATION OF NEEDS Skin   Limited Assist Verbally Normal                       Personal  Care Assistance Level of Assistance  Bathing, Feeding, Dressing Bathing Assistance: Limited assistance Feeding assistance: Limited assistance Dressing Assistance: Limited assistance     Functional Limitations Info  Sight Sight Info: Impaired        SPECIAL CARE FACTORS FREQUENCY  PT (By licensed PT), OT (By licensed OT)     PT Frequency: 5x/wk OT Frequency: 5x/wk            Contractures Contractures Info: Not present    Additional Factors Info  Code Status, Allergies, Psychotropic, Isolation Precautions Code Status Info: DNR Allergies Info: Aspirin, Lisinopril Psychotropic Info: Aricept 5mg  daily, Remeron 7.5mg  daily at bed   Isolation Precautions Info: Airborne/Contact precautions, COVID-19     Current Medications (08/21/2019):  This is the current hospital active medication list Current Facility-Administered Medications  Medication Dose Route Frequency Provider Last Rate Last Admin  . acetaminophen (TYLENOL) tablet 650 mg  650 mg Oral Q6H PRN Kristopher Oppenheim, DO      . amLODipine (NORVASC) tablet 2.5 mg  2.5 mg Oral Daily Peyton Bottoms, MD   2.5 mg at 08/21/19 [id number]  . ascorbic acid (VITAMIN C) tablet 500 mg  500 mg Oral Daily Allie Bossier, MD   500 mg at 08/21/19 [id number]  . cephALEXin (KEFLEX) capsule 250 mg  250 mg Oral Q8H Allie Bossier, MD   250 mg at 08/21/19 [id number]  . dexamethasone (DECADRON) tablet 6 mg  6 mg Oral Q24H Kristopher Oppenheim, DO   6 mg at 08/21/19 0511  .  dextrose 5 % solution   Intravenous Continuous Allie Bossier, MD 75 mL/hr at 08/21/19 0510 New Bag at 08/21/19 0510  . donepezil (ARICEPT) tablet 5 mg  5 mg Oral QHS Peyton Bottoms, MD   5 mg at 08/20/19 2059  . heparin injection 5,000 Units  5,000 Units Subcutaneous Q8H Kristopher Oppenheim, DO   5,000 Units at 08/21/19 [id number]  . Ipratropium-Albuterol (COMBIVENT) respimat 1 puff  1 puff Inhalation QID Allie Bossier, MD   1 puff at 08/21/19 0914  . LORazepam (ATIVAN) tablet 0.5 mg  0.5 mg Oral BID PRN Peyton Bottoms, MD       . losartan (COZAAR) tablet 50 mg  50 mg Oral Daily Peyton Bottoms, MD   50 mg at 08/21/19 0920  . mirtazapine (REMERON) tablet 7.5 mg  7.5 mg Oral QHS Peyton Bottoms, MD   7.5 mg at 08/20/19 2058  . ondansetron (ZOFRAN) tablet 4 mg  4 mg Oral Q6H PRN Kristopher Oppenheim, DO       Or  . ondansetron [facility]) injection 4 mg  4 mg Intravenous Q6H PRN Kristopher Oppenheim, DO      . risperiDONE (RISPERDAL M-TABS) disintegrating tablet 0.75 mg  0.75 mg Oral QHS Allie Bossier, MD       And  . risperiDONE (RISPERDAL M-TABS) disintegrating tablet 0.5 mg  0.5 mg Oral Daily Allie Bossier, MD      . senna-docusate (Senokot-S) tablet 1 tablet  1 tablet Oral QHS PRN Kristopher Oppenheim, DO      . zinc sulfate capsule 220 mg  220 mg Oral Daily Allie Bossier, MD   220 mg at 08/21/19 [id number]     Discharge Medications: Please see discharge summary for a list of discharge medications.  Relevant Imaging Results:  Relevant Lab Results:   Additional Information SS#: [id number]  Geralynn Ochs, LCSW

## 2019-08-21 NOTE — Discharge Summary (Signed)
Physician Discharge Summary  Leah Waller [id number] DOB: 01-Mar-1935 DOA: 08/18/2019  PCP: Orvis Brill, [facility]  Admit date: 08/18/2019 Discharge date: 08/21/2019  Time spent: 30 minutes  Recommendations for Outpatient Follow-up:   Pneumonia due to COVID-19 virus COVID-19 Labs  Recent Labs    08/19/19 0224 08/20/19 0025 08/21/19 0133  DDIMER 1.02* 0.70* 0.59*  FERRITIN 1,026* 831* 658*  CRP 5.5* 2.7* 2.2*    2/16 Covid positive outside hospital   -Decadron 6 mg daily -Remdesivir per pharmacy protocol -Vitamins per Covid protocol -Combivent -Titrate O2 to maintain SPO2 > 88% -Patient will be considered contagious until 3/9, take appropriate actions such as masking, disinfecting of surfaces frequently, quarantining patient as much as practical, etc.  Acute metabolic encephalopathy -Likely due to AKI and covid-19.  -DC all sedating medication  Chronic systolic and diastolic CHF -[id number] echocardiogram consistent with above diagnosis see results below -Strict in and out -Daily weight  Acute on CKD stage IIIa (baseline Cr 1.73) Recent Labs  Lab 08/17/19 1201 08/18/19 0520 08/19/19 0224 08/20/19 0025 08/21/19 0133  CREATININE 3.09* 2.10*  2.08* 2.01* 1.58* 1.27*  -Better than baseline  Acute cystitis positive E. Coli -UA suggestive of acute cystitis. -Complete 5-day course antibiotics  Dehydration with hypernatremia -Resolved -See acute on CKD stage III.  Essential hypertension -Hold BP meds due to dehydration.  -Holding ARB due to AKI.  Frequent falls -2/18 PT/OT consult A/O x1 (does not know where, when, why).  States lives alone.  Evaluate for SNF   Bradycardia -Possible cause of her frequent falls? -2/19 echocardiogram; consistent with systolic and diastolic CHF -Discussed case with Dr. Thompson Grayer, EP who feels that most likely secondary to patient's acute Covid illness will follow-up with a virtual visit with patient and a  couple weeks post discharge.  Alzheimer's dementia with behavioral disturbance ([place]) -Chronic Will need updated MAR from [facility].     Discharge Diagnoses:  Principal Problem:   Pneumonia due to COVID-19 virus Active Problems:   Alzheimer's dementia with behavioral disturbance ([place])   Essential hypertension   CKD (chronic kidney disease) stage 3, GFR 30-59 ml/min   COVID-19 virus infection   Acute metabolic encephalopathy   Frequent falls   Acute renal failure superimposed on stage 3a chronic kidney disease (HCC)   Acute respiratory disease due to COVID-19 virus   Dehydration with hypernatremia   Acute cystitis   DNR (do not resuscitate)   Dementia without behavioral disturbance ([place])   Bipolar 1 disorder ([place])   Discharge Condition: Stable  Diet recommendation: Soft fluid consistency thin  Filed Weights   08/20/19 1352  Weight: 74.8 kg    History of present illness:  Leah Waller a 85 y.o.BF PMHx Dementia, Bipolar, Anxiety, HTN, iron deficiency anemia, CKD stage III  Presents withfall andweakness.  Patient hasdementia, andis unable to provide accurate medical history. I called her daughter who also dose not knowwhat happened to patient in detailsince that she is not living with patient, therefore, most of the history is obtained by discussing the case with ED physician, per EMS report, and with the nursing staff.  Per report, pt was found to havedecreased level of alertness,generalized weakness. Normally she is ambulatory and conversant, however at bedside notes that patient has beenmoreconfused and somnolent for the past 2 days. She isunable to stand or ambulate. Has decreased oral intake. Patient has had multiple falls over the last week or 2.They note that she appears to stop moving her left leg since yesterday.  Patient indicates some throat discomfort but unable to describe any other specific symptoms.Does not seem to have chest  pain or abdominal pain. No active nausea vomiting, diarrhea noted. No active cough noted.  ED Course:pt was found to have positive Covid 19 Ag test, WBC 7.0, worsening renal function, temperature normal, blood pressure 123/61, heart rate 63, RR 25, oxygen saturation 93-95% on RA,chest x-ray showed bilateral infiltration. CT of the head is negative for acute intracranial abnormalities. CT of C-spine is negative for bony fracture. X-ray of left hip/pelvis is negative for bony fracture. Patient is admitted to [facility] bed as inpatient.  Hospital Course:  Treated for Covid pneumonia responded well to Covid protocol now on room air.  Was also treated for acute cystitis positive for E. coli again responded well to appropriate antibiotics.  Patient was found to have significant bradycardia secondary to Covid (Covid heart) asymptomatic.Discussed case with Dr. Thompson Grayer, EP who feels that most likely secondary to patient's acute Covid illness will follow-up with a virtual visit with patient and a couple weeks post discharge.  Stable for discharge   Procedures: 2/19 echocardiogram;Left Ventricle: EF =45 to 50%.-The left ventricle demonstrates global hypokinesis. -Grade I diastolic dysfunction (impaired relaxation).  -Pericardium: A small pericardial effusion is present. The pericardial effusion is circumferential. Presence of pericardial fat pad.     Consultations: Phone consult; Discussed case with Dr. Thompson Grayer, EP   Cultures  2/16 Covid positive outside hospital 2/16 blood right AC NGTD 2/16 blood LEFT hand NGTD 2/16 urine positive E. Coli 2/17 urine positive E. Coli    Antibiotics Anti-infectives (From admission, onward)   Start     Dose/Rate Stop   08/21/19 0800  remdesivir 100 mg in sodium chloride 0.9 % 100 mL IVPB     100 mg 200 mL/hr over 30 Minutes 08/22/19 0959   08/21/19 0600  cephALEXin (KEFLEX) capsule 250 mg     250 mg 08/23/19 0559   08/18/19 1000  remdesivir  100 mg in sodium chloride 0.9 % 100 mL IVPB  Status:  Discontinued     100 mg 200 mL/hr over 30 Minutes 08/20/19 1427   08/18/19 0600  cefTRIAXone (ROCEPHIN) 1 g in sodium chloride 0.9 % 100 mL IVPB  Status:  Discontinued     1 g 200 mL/hr over 30 Minutes 08/20/19 1822       Discharge Exam: Vitals:   08/20/19 2058 08/20/19 2328 08/21/19 0510 08/21/19 0800  BP:  138/62 (!) 142/70 (!) 149/80  Pulse:  (!) 54 (!) 56 60  Resp:  19 17 12   Temp: (!) 96.1 F (35.6 C) (!) 96 F (35.6 C) (!) 97.3 F (36.3 C) (!) 97.3 F (36.3 C)  TempSrc: Axillary Axillary Axillary Axillary  SpO2:  95% 96% 95%  Weight:        General: A/O x1 (does not know where, when, why) no acute respiratory distress Eyes: negative scleral hemorrhage, negative anisocoria, negative icterus ENT: Negative Runny nose, negative gingival bleeding, Neck:  Negative scars, masses, torticollis, lymphadenopathy, JVD Lungs: Clear to auscultation bilaterally without wheezes or crackles Cardiovascular: Regular rate and rhythm without murmur gallop or rub normal S1 and S2   Discharge Instructions   Allergies as of 08/21/2019      Reactions   Aspirin Other (See Comments)   Unspecified   Lisinopril Other (See Comments)      Medication List    TAKE these medications   acetaminophen 500 MG tablet Commonly known as: TYLENOL Take 500  mg by mouth every 4 (four) hours as needed for mild pain or fever.   Alumina-Magnesia-Simethicone 200-200-20 MG/5ML suspension Generic drug: alum & mag hydroxide-simeth Take 30 mLs by mouth every 6 (six) hours as needed for indigestion or heartburn.   amLODipine 2.5 MG tablet Commonly known as: NORVASC Take 2.5 mg by mouth daily.   ascorbic acid 500 MG tablet Commonly known as: VITAMIN C Take 1 tablet (500 mg total) by mouth daily.   cephALEXin 250 MG capsule Commonly known as: KEFLEX Take 1 capsule (250 mg total) by mouth every 8 (eight) hours for 6 doses.   [employer] VITAMIN B12 1000  MCG tablet Generic drug: cyanocobalamin Take 1,000 mcg by mouth daily.   dexamethasone 6 MG tablet Commonly known as: DECADRON Take 1 tablet (6 mg total) by mouth daily. Start taking on: August 22, 2019   donepezil 5 MG tablet Commonly known as: ARICEPT Take 5 mg by mouth at bedtime.   ferrous sulfate 325 (65 FE) MG tablet Take 325 mg by mouth daily with breakfast.   guaiFENesin 100 MG/5ML liquid Commonly known as: ROBITUSSIN Take 200 mg by mouth every 6 (six) hours as needed for cough.   Ipratropium-Albuterol 20-100 MCG/ACT Aers respimat Commonly known as: COMBIVENT Inhale 1 puff into the lungs 4 (four) times daily.   loperamide 2 MG capsule Commonly known as: IMODIUM Take 2 mg by mouth as needed for diarrhea or loose stools.   LORazepam 0.5 MG tablet Commonly known as: ATIVAN Take 1 tablet (0.5 mg total) by mouth 2 (two) times daily as needed for anxiety.   losartan 50 MG tablet Commonly known as: COZAAR Take 50 mg by mouth daily.   magnesium hydroxide 400 MG/5ML suspension Commonly known as: MILK OF MAGNESIA Take 30 mLs by mouth at bedtime as needed for mild constipation.   mirtazapine 7.5 MG tablet Commonly known as: REMERON Take 1 tablet (7.5 mg total) by mouth at bedtime. For sleep   neomycin-bacitracin-polymyxin [phone] ointment Apply 1 application topically daily as needed (skin tears).   ondansetron 4 MG tablet Commonly known as: ZOFRAN Take 1 tablet (4 mg total) by mouth every 6 (six) hours as needed for nausea.   risperiDONE 0.5 MG tablet Commonly known as: RisperDAL Take 1-1.5 tablets (0.5-0.75 mg total) by mouth as directed. Take 1 tablet in the AM and 1.5 tablets PM What changed:   when to take this  additional instructions  Another medication with the same name was removed. Continue taking this medication, and follow the directions you see here.   zinc sulfate 220 (50 Zn) MG capsule Take 1 capsule (220 mg total) by mouth daily.       Allergies  Allergen Reactions  . Aspirin Other (See Comments)    Unspecified  . Lisinopril Other (See Comments)      The results of significant diagnostics from this hospitalization (including imaging, microbiology, ancillary and laboratory) are listed below for reference.    Significant Diagnostic Studies: CT HEAD WO CONTRAST  Result Date: 08/17/2019 CLINICAL DATA:  Altered mental status for 2 days. EXAM: CT HEAD WITHOUT CONTRAST CT CERVICAL SPINE WITHOUT CONTRAST TECHNIQUE: Multidetector CT imaging of the head and cervical spine was performed following the standard protocol without intravenous contrast. Multiplanar CT image reconstructions of the cervical spine were also generated. COMPARISON:  Cervical spine MRI 09/19/2008. Head CT scan 04/30/2019. FINDINGS: CT HEAD FINDINGS Brain: No evidence of acute infarction, hemorrhage, hydrocephalus, extra-axial collection or mass lesion/mass effect. Mild chronic microvascular ischemic change  again seen. Vascular: Atherosclerosis. Skull: Intact.  No focal lesion. Sinuses/Orbits: Status post cataract surgery on the right. The patient is also status post right maxillary antrostomy. Other: None. Exaggeration of the normal cervical lordosis. 0.3 cm anterolisthesis C4 on C5 due to facet arthropathy. CT CERVICAL SPINE FINDINGS Alignment: There is some exaggeration of the normal cervical lordosis. 0.3 cm anterolisthesis C4 on C5 due to facet arthropathy. Skull base and vertebrae: No acute fracture. No primary bone lesion or focal pathologic process. Soft tissues and spinal canal: No prevertebral fluid or swelling. No visible canal hematoma. Disc levels: There is some loss of disc space height at C4-5. Small disc bulges are seen at C5-6 and C6-7. Upper chest: Lung apices clear. Other: None. IMPRESSION: No acute abnormality head or cervical spine. Chronic microvascular change. Mild cervical spondylosis. Electronically Signed   By: Inge Rise M.D.   On:  08/17/2019 13:27   CT CERVICAL SPINE WO CONTRAST  Result Date: 08/17/2019 CLINICAL DATA:  Altered mental status for 2 days. EXAM: CT HEAD WITHOUT CONTRAST CT CERVICAL SPINE WITHOUT CONTRAST TECHNIQUE: Multidetector CT imaging of the head and cervical spine was performed following the standard protocol without intravenous contrast. Multiplanar CT image reconstructions of the cervical spine were also generated. COMPARISON:  Cervical spine MRI 09/19/2008. Head CT scan 04/30/2019. FINDINGS: CT HEAD FINDINGS Brain: No evidence of acute infarction, hemorrhage, hydrocephalus, extra-axial collection or mass lesion/mass effect. Mild chronic microvascular ischemic change again seen. Vascular: Atherosclerosis. Skull: Intact.  No focal lesion. Sinuses/Orbits: Status post cataract surgery on the right. The patient is also status post right maxillary antrostomy. Other: None. Exaggeration of the normal cervical lordosis. 0.3 cm anterolisthesis C4 on C5 due to facet arthropathy. CT CERVICAL SPINE FINDINGS Alignment: There is some exaggeration of the normal cervical lordosis. 0.3 cm anterolisthesis C4 on C5 due to facet arthropathy. Skull base and vertebrae: No acute fracture. No primary bone lesion or focal pathologic process. Soft tissues and spinal canal: No prevertebral fluid or swelling. No visible canal hematoma. Disc levels: There is some loss of disc space height at C4-5. Small disc bulges are seen at C5-6 and C6-7. Upper chest: Lung apices clear. Other: None. IMPRESSION: No acute abnormality head or cervical spine. Chronic microvascular change. Mild cervical spondylosis. Electronically Signed   By: Inge Rise M.D.   On: 08/17/2019 13:27   DG Chest Portable 1 View  Result Date: 08/17/2019 CLINICAL DATA:  Altered mental status for 2 days. EXAM: PORTABLE CHEST 1 VIEW COMPARISON:  Single-view of the chest 07/08/2009. FINDINGS: Hazy airspace disease is seen bilaterally, most notable in the upper lung zones and left  lung base. No pneumothorax or pleural effusion. Heart size is mildly enlarged. No acute or focal bony abnormality. IMPRESSION: Hazy bilateral airspace disease has an appearance most worrisome for pneumonia. Electronically Signed   By: Inge Rise M.D.   On: 08/17/2019 13:16   ECHOCARDIOGRAM COMPLETE  Result Date: 08/20/2019    ECHOCARDIOGRAM REPORT   Patient Name:   BEIJA BASICH [facility] Date of Exam: 08/20/2019 Medical Rec #:  [id number]            Height:       64.0 in Accession #:    [id number]           Weight:       149.9 lb Date of Birth:  [birth year]             BSA:          1.73 m  Patient Age:    85 years             BP:           157/58 mmHg Patient Gender: F                    HR:           50 bpm. Exam Location:  Inpatient Procedure: 2D Echo Indications:    Bradycardia  History:        Patient has no prior history of Echocardiogram examinations.                 Risk Factors:Hypertension. COVID-19 virus infection                 Chronic kidney disease.  Sonographer:    Vikki Ports Turrentine Referring Phys: [id number] [place] Comments: Suboptimal subcostal window. IMPRESSIONS  1. Left ventricular ejection fraction, by estimation, is 45 to 50%. The left ventricle has mildly decreased function. The left ventricle demonstrates global hypokinesis. Left ventricular diastolic parameters are consistent with Grade I diastolic dysfunction (impaired relaxation).  2. Right ventricular systolic function is normal. The right ventricular size is normal. Tricuspid regurgitation signal is inadequate for assessing PA pressure.  3. Left atrial size was mildly dilated.  4. The mitral valve is grossly normal. Mild mitral valve regurgitation. No evidence of mitral stenosis.  5. The aortic valve is tricuspid. Aortic valve regurgitation is mild. No aortic stenosis is present. Comparison(s): No prior Echocardiogram. FINDINGS  Left Ventricle: Left ventricular ejection fraction, by estimation, is 45 to 50%. The  left ventricle has mildly decreased function. The left ventricle demonstrates global hypokinesis. The left ventricular internal cavity size was normal in size. There is  no left ventricular hypertrophy. Left ventricular diastolic parameters are consistent with Grade I diastolic dysfunction (impaired relaxation). Normal left ventricular filling pressure. Right Ventricle: The right ventricular size is normal. No increase in right ventricular wall thickness. Right ventricular systolic function is normal. Tricuspid regurgitation signal is inadequate for assessing PA pressure. Left Atrium: Left atrial size was mildly dilated. Right Atrium: Right atrial size was normal in size. Pericardium: A small pericardial effusion is present. The pericardial effusion is circumferential. Presence of pericardial fat pad. Mitral Valve: The mitral valve is grossly normal. Mild mitral valve regurgitation. No evidence of mitral valve stenosis. Tricuspid Valve: The tricuspid valve is grossly normal. Tricuspid valve regurgitation is trivial. Aortic Valve: The aortic valve is tricuspid. Aortic valve regurgitation is mild. No aortic stenosis is present. Pulmonic Valve: The pulmonic valve was grossly normal. Pulmonic valve regurgitation is trivial. Aorta: The aortic root is normal in size and structure. Venous: The inferior vena cava was not well visualized. IAS/Shunts: No atrial level shunt detected by color flow Doppler.  LEFT VENTRICLE PLAX 2D LVIDd:         3.92 cm  Diastology LVIDs:         2.82 cm  LV e' lateral:   6.49 cm/s LV PW:         0.94 cm  LV E/e' lateral: 13.4 LV IVS:        0.87 cm  LV e' medial:    7.80 cm/s LVOT diam:     1.90 cm  LV E/e' medial:  11.1 LV SV:         62.66 ml LV SV Index:   20.75 LVOT Area:     2.84 cm  RIGHT VENTRICLE RV S prime:  12.00 cm/s TAPSE (M-mode): 2.7 cm LEFT ATRIUM             Index       RIGHT ATRIUM           Index LA diam:        3.80 cm 2.20 cm/m  RA Area:     17.80 cm LA Vol (A2C):    54.6 ml 31.55 ml/m RA Volume:   48.40 ml  27.96 ml/m LA Vol (A4C):   51.5 ml 29.76 ml/m LA Biplane Vol: 54.4 ml 31.43 ml/m  AORTIC VALVE LVOT Vmax:   73.90 cm/s LVOT Vmean:  53.400 cm/s LVOT VTI:    0.221 m  AORTA Ao Root diam: 2.80 cm MITRAL VALVE MV Area (PHT): 2.87 cm    SHUNTS MV Decel Time: 264 msec    Systemic VTI:  0.22 m MV E velocity: 86.80 cm/s  Systemic Diam: 1.90 cm MV A velocity: 87.80 cm/s MV E/A ratio:  0.99 Eleonore Chiquito MD Electronically signed by Eleonore Chiquito MD Signature Date/Time: 08/20/2019/10:52:05 AM    Final    DG Hip Unilat W or Wo Pelvis 2-3 Views Left  Result Date: 08/17/2019 CLINICAL DATA:  Altered mental status. The patient refuses to ambulate. No known injury. EXAM: DG HIP (WITH OR WITHOUT PELVIS) 2-3V LEFT COMPARISON:  None. FINDINGS: There is no evidence of hip fracture or dislocation. There is no evidence of arthropathy or other focal bone abnormality. IMPRESSION: Negative exam. Electronically Signed   By: Inge Rise M.D.   On: 08/17/2019 13:15    Microbiology: Recent Results (from the past 240 hour(s))  Culture, blood (Routine X 2) w Reflex to ID Panel     Status: None (Preliminary result)   Collection Time: 08/17/19  7:08 PM   Specimen: BLOOD  Result Value Ref Range Status   Specimen Description BLOOD RIGHT ANTECUBITAL  Final   Special Requests   Final    BOTTLES DRAWN AEROBIC AND ANAEROBIC Blood Culture adequate volume   Culture   Final    NO GROWTH 4 DAYS Performed at [facility], [address] [place], [place] [address]    Report Status PENDING  Incomplete  Urine culture     Status: Abnormal (Preliminary result)   Collection Time: 08/17/19  7:09 PM   Specimen: Urine, Random  Result Value Ref Range Status   Specimen Description   Final    URINE, RANDOM Performed at [facility], [address], [place], [place] [address]    Special Requests   Final    NONE Performed at [facility], [address],  [place], [place] [address]    Culture (A)  Final    >=100,000 COLONIES/mL ESCHERICHIA COLI CULTURE REINCUBATED FOR BETTER GROWTH Performed at [facility], [place] [address], [place], [place] [address]    Report Status PENDING  Incomplete   Organism ID, Bacteria ESCHERICHIA COLI (A)  Final      Susceptibility   Escherichia coli - MIC*    AMPICILLIN <=2 SENSITIVE Sensitive     CEFAZOLIN <=4 SENSITIVE Sensitive     CEFTRIAXONE <=0.25 SENSITIVE Sensitive     CIPROFLOXACIN <=0.25 SENSITIVE Sensitive     GENTAMICIN <=1 SENSITIVE Sensitive     IMIPENEM <=0.25 SENSITIVE Sensitive     NITROFURANTOIN 32 SENSITIVE Sensitive     TRIMETH/SULFA <=20 SENSITIVE Sensitive     AMPICILLIN/SULBACTAM <=2 SENSITIVE Sensitive     PIP/TAZO <=4 SENSITIVE Sensitive     * >=  100,000 COLONIES/mL ESCHERICHIA COLI  Culture, blood (Routine X 2) w Reflex to ID Panel     Status: None (Preliminary result)   Collection Time: 08/17/19  7:09 PM   Specimen: BLOOD  Result Value Ref Range Status   Specimen Description BLOOD BLOOD LEFT HAND  Final   Special Requests   Final    BOTTLES DRAWN AEROBIC AND ANAEROBIC Blood Culture adequate volume   Culture   Final    NO GROWTH 4 DAYS Performed at [facility], [address] [place], [place] [address]    Report Status PENDING  Incomplete  Culture, Urine     Status: Abnormal   Collection Time: 08/18/19  9:12 AM   Specimen: Urine, Clean Catch  Result Value Ref Range Status   Specimen Description   Final    URINE, CLEAN CATCH Performed at [facility], [place] [address], [place], [place] [address]    Special Requests   Final    NONE Performed at [facility], [place] [address], [place], [place] [address]    Culture 50,000 COLONIES/mL ESCHERICHIA COLI (A)  Final   Report Status 08/21/2019 FINAL  Final   Organism ID, Bacteria ESCHERICHIA COLI (A)  Final      Susceptibility   Escherichia coli - MIC*    AMPICILLIN <=2 SENSITIVE  Sensitive     CEFAZOLIN <=4 SENSITIVE Sensitive     CEFTRIAXONE <=0.25 SENSITIVE Sensitive     CIPROFLOXACIN <=0.25 SENSITIVE Sensitive     GENTAMICIN <=1 SENSITIVE Sensitive     IMIPENEM <=0.25 SENSITIVE Sensitive     NITROFURANTOIN <=16 SENSITIVE Sensitive     TRIMETH/SULFA <=20 SENSITIVE Sensitive     AMPICILLIN/SULBACTAM <=2 SENSITIVE Sensitive     PIP/TAZO <=4 SENSITIVE Sensitive     * 50,000 COLONIES/mL ESCHERICHIA COLI     Labs: Basic Metabolic Panel: Recent Labs  Lab 08/17/19 1201 08/18/19 0520 08/19/19 0224 08/20/19 0025 08/21/19 0133  NA 147* 142 147* 144 141  K 4.8 4.7 4.5 3.8 4.1  CL 114* 114* 120* 114* 111  CO2 20* 17* 16* 19* 19*  GLUCOSE 121* 135* 157* 289* 235*  BUN 62* 59* 68* 64* 56*  CREATININE 3.09* 2.10*  2.08* 2.01* 1.58* 1.27*  CALCIUM 8.3* 8.0* 7.8* 7.6* 7.6*  MG  --  2.6* 2.4 2.3 2.3  PHOS  --  4.7* 4.2 2.7 3.5   Liver Function Tests: Recent Labs  Lab 08/17/19 1201 08/18/19 0520 08/19/19 0224 08/20/19 0025 08/21/19 0133  AST 26 24 20  51* 36  ALT 16 17 14  48* 48*  ALKPHOS 79 80 71 72 68  BILITOT 0.7 0.4 0.1* 0.5 0.4  PROT 7.9 7.8 6.8 6.6 6.5  ALBUMIN 2.9* 2.8* 2.4* 2.4* 2.5*   Recent Labs  Lab 08/17/19 1201  LIPASE 33   No results for input(s): AMMONIA in the last 168 hours. CBC: Recent Labs  Lab 08/17/19 1201 08/18/19 0520 08/19/19 0224 08/20/19 0025 08/21/19 0133  WBC 7.0 5.1 5.1 6.3 6.9  NEUTROABS  --  4.1 3.6 4.6 4.9  HGB 11.4* 11.2* 10.1* 10.0* 10.0*  HCT 36.4 36.2 32.5* 31.6* 31.5*  MCV 98.4 98.6 98.5 96.6 96.0  PLT 176 171 197 201 227   Cardiac Enzymes: No results for input(s): CKTOTAL, CKMB, CKMBINDEX, TROPONINI in the last 168 hours. BNP: BNP (last 3 results) Recent Labs    08/17/19 1909  BNP 39.0    ProBNP (last 3 results) No results for input(s): PROBNP in the  last 8760 hours.  CBG: No results for input(s): GLUCAP in the last 168 hours.     Signed:  Dia Crawford, MD [facility] [phone] pager

## 2019-08-22 LAB — COMPREHENSIVE METABOLIC PANEL
ALT: 40 U/L (ref 0–44)
AST: 26 U/L (ref 15–41)
Albumin: 2.4 g/dL — ABNORMAL LOW (ref 3.5–5.0)
Alkaline Phosphatase: 64 U/L (ref 38–126)
Anion gap: 9 (ref 5–15)
BUN: 51 mg/dL — ABNORMAL HIGH (ref 8–23)
CO2: 20 mmol/L — ABNORMAL LOW (ref 22–32)
Calcium: 7.6 mg/dL — ABNORMAL LOW (ref 8.9–10.3)
Chloride: 114 mmol/L — ABNORMAL HIGH (ref 98–111)
Creatinine, Ser: 1.29 mg/dL — ABNORMAL HIGH (ref 0.44–1.00)
GFR calc Af Amer: 44 mL/min — ABNORMAL LOW (ref >60)
GFR calc non Af Amer: 38 mL/min — ABNORMAL LOW (ref >60)
Glucose, Bld: 165 mg/dL — ABNORMAL HIGH (ref 70–99)
Potassium: 4.1 mmol/L (ref 3.5–5.1)
Sodium: 143 mmol/L (ref 135–145)
Total Bilirubin: 0.3 mg/dL (ref 0.3–1.2)
Total Protein: 6 g/dL — ABNORMAL LOW (ref 6.5–8.1)

## 2019-08-22 LAB — CBC WITH DIFFERENTIAL/PLATELET
Abs Immature Granulocytes: 1.66 10*3/uL — ABNORMAL HIGH (ref 0.00–0.07)
Basophils Absolute: 0 10*3/uL (ref 0.0–0.1)
Basophils Relative: 0 %
Eosinophils Absolute: 0 10*3/uL (ref 0.0–0.5)
Eosinophils Relative: 0 %
HCT: 30.9 % — ABNORMAL LOW (ref 36.0–46.0)
Hemoglobin: 9.9 g/dL — ABNORMAL LOW (ref 12.0–15.0)
Immature Granulocytes: 16 %
Lymphocytes Relative: 13 %
Lymphs Abs: 1.4 10*3/uL (ref 0.7–4.0)
MCH: 30.7 pg (ref 26.0–34.0)
MCHC: 32 g/dL (ref 30.0–36.0)
MCV: 96 fL (ref 80.0–100.0)
Monocytes Absolute: 0.9 10*3/uL (ref 0.1–1.0)
Monocytes Relative: 8 %
Neutro Abs: 6.8 10*3/uL (ref 1.7–7.7)
Neutrophils Relative %: 63 %
Platelets: 214 10*3/uL (ref 150–400)
RBC: 3.22 MIL/uL — ABNORMAL LOW (ref 3.87–5.11)
RDW: 13.1 % (ref 11.5–15.5)
WBC: 10.7 10*3/uL — ABNORMAL HIGH (ref 4.0–10.5)
nRBC: 0.2 % (ref 0.0–0.2)

## 2019-08-22 LAB — D-DIMER, QUANTITATIVE: D-Dimer, Quant: 0.5 ug/mL-FEU (ref 0.00–0.50)

## 2019-08-22 LAB — URINE CULTURE: Culture: >=100000 — AB

## 2019-08-22 LAB — CULTURE, BLOOD (ROUTINE X 2)
Culture: NO GROWTH
Culture: NO GROWTH
Special Requests: ADEQUATE
Special Requests: ADEQUATE

## 2019-08-22 LAB — FERRITIN: Ferritin: 375 ng/mL — ABNORMAL HIGH (ref 11–307)

## 2019-08-22 LAB — C-REACTIVE PROTEIN: CRP: 1.2 mg/dL — ABNORMAL HIGH (ref <1.0)

## 2019-08-22 LAB — MAGNESIUM: Magnesium: 2.3 mg/dL (ref 1.7–2.4)

## 2019-08-22 LAB — PHOSPHORUS: Phosphorus: 3.2 mg/dL (ref 2.5–4.6)

## 2019-08-22 MED ORDER — AMLODIPINE BESYLATE 5 MG PO TABS
2.5000 mg | ORAL_TABLET | Freq: Once | ORAL | Status: AC
  Administered 2019-08-22: 12:00:00 2.5 mg via ORAL
  Filled 2019-08-22: qty 1

## 2019-08-22 MED ORDER — AMLODIPINE BESYLATE 5 MG PO TABS
5.0000 mg | ORAL_TABLET | Freq: Every day | ORAL | Status: DC
  Administered 2019-08-23 – 2019-08-24 (×2): 5 mg via ORAL
  Filled 2019-08-22 (×2): qty 1

## 2019-08-22 MED ORDER — AMLODIPINE 1 MG/ML ORAL SUSPENSION: 2.5000 mg | Freq: Every day | ORAL | Status: DC

## 2019-08-22 NOTE — Progress Notes (Signed)
Physical Therapy Treatment Patient Details Name: Leah Waller MRN: [id number] DOB: [birth year] Today's Date: 08/22/2019    History of Present Illness 85 y.o. female admitted on 08/18/19 for fall and weakness.  Found to have COVID 19 PNA.  CT of head and c-spine negative for fx, x-ray of L hip/pelvis also negative for fx. Other dx include acute metabolic encepholopathy superimposed on baseline Alzheimer's dementia, AKI, acute cystitis (positive e-coli), dehydration with hypernatremia, bradycardia.  Pt with other significant PMH of HTN, bipolar d/o, CKD III, chronic systolic and diastolic CHF.     PT Comments    She was resting in bed when PT arrived. Requires min-mod A with cues for all bed mobility and although she was able to sit on edge of bed, she declined any OOB activity- did agree to ex format. Performed LE ex with A, AA- cues for directives. She is generally weak and requires a great deal of encouragement and reassurance. Flat affect. Should continue to benefit from PT to further address goals to progress toward optimal functional outcomes.    Follow Up Recommendations        Equipment Recommendations       Recommendations for Other Services       Precautions / Restrictions Precautions Precautions: Fall Precaution Comments: reportedly increased falls at home Restrictions Weight Bearing Restrictions: No    Mobility  Bed Mobility Overal bed mobility: Needs Assistance Bed Mobility: Supine to Sit     Supine to sit: Mod assist Sit to supine: Mod assist   General bed mobility comments: Mod assist to help lift both legs back into bed. (Had just returned to bed and did not want to get OOB for any activity- agreed to ex.)  Transfers                    Ambulation/Gait                 Stairs             Wheelchair Mobility    Modified Rankin (Stroke Patients Only)       Balance Overall balance assessment: Needs assistance Sitting-balance  support: Feet supported;Bilateral upper extremity supported Sitting balance-Leahy Scale: Fair                                      Cognition Arousal/Alertness: Awake/alert Behavior During Therapy: Flat affect Overall Cognitive Status: History of cognitive impairments - at baseline                                 General Comments: No one to determine if current affect and presentation is normal, but baseline Alzheimer's dementia.       Exercises General Exercises - Lower Extremity Ankle Circles/Pumps: AROM;Supine;AAROM Quad Sets: AROM;Supine;AAROM Hip ABduction/ADduction: AROM;Supine;AAROM Straight Leg Raises: AROM;AAROM;Supine    General Comments General comments (skin integrity, edema, etc.): Monitor skin and joint integrity. Has been stable for SPO2 on RA      Pertinent Vitals/Pain Pain Assessment: No/denies pain    Home Living                      Prior Function            PT Goals (current goals can now be found in the care plan section)  Frequency     3 x weekly      PT Plan  Continue as outlined    Co-evaluation              AM-PAC PT "6 Clicks" Mobility   Outcome Measure   16 total- home with HH                End of Session               Time:  - 0200 end time 233 pm-> 33 minutes    Charges:    1 visit (acute)     2 units of therapeutic exercise  (33 minutes)                  Effa Yarrow P, PT # [phone] CGV cell   Casandra Doffing 08/22/2019, 3:51 PM

## 2019-08-22 NOTE — Progress Notes (Addendum)
PROGRESS NOTE    Leah Waller  [id number] DOB: 06-04-1935 DOA: 08/18/2019 PCP: Orvis Brill, [facility]   Brief Narrative:  Leah Waller is a 85 y.o. BF (from [place]) PMHx Dementia, Bipolar, Anxiety, HTN, iron deficiency anemia, CKD stage III  Presents with fall and weakness.  Patient has dementia, and is unable to provide accurate medical history. I called her daughter who also dose not know what happened to patient in detail since that she is not living with patient, therefore, most of the history is obtained by discussing the case with ED physician, per EMS report, and with the nursing staff.  Per report, pt was found to have decreased level of alertness, generalized weakness.  Normally she is ambulatory and conversant, however at bedside notes that patient has been more confused and somnolent for the past 2 days. She is unable to stand or ambulate.  Has decreased oral intake. Patient has had multiple falls over the last week or 2.They note that she appears to stop moving her left leg since yesterday. Patient indicates some throat discomfort but unable to describe any other specific symptoms.  Does not seem to have chest pain or abdominal pain.  No active nausea vomiting, diarrhea noted.  No active cough noted.  ED Course: pt was found to have positive Covid 19 Ag test, WBC 7.0, worsening renal function, temperature normal, blood pressure 123/61, heart rate 63, RR 25, oxygen saturation 93-95% on RA, chest x-ray showed bilateral infiltration.  CT of the head is negative for acute intracranial abnormalities.  CT of C-spine is negative for bony fracture.  X-ray of left hip/pelvis is negative for bony fracture.  Patient is admitted to [facility] bed as inpatient.   Subjective: 12/21 afebrile overnight.  A/O x1 (does not know where, when, why).  Pleasantly confused.  Negative CP, negative S OB.  Follows commands.   Assessment & Plan:     Principal Problem:   Pneumonia due to COVID-19 virus Active Problems:   Alzheimer's dementia with behavioral disturbance ([place])   Essential hypertension   CKD (chronic kidney disease) stage 3, GFR 30-59 ml/min   COVID-19 virus infection   Acute metabolic encephalopathy   Frequent falls   Acute renal failure superimposed on stage 3a chronic kidney disease (HCC)   Acute respiratory disease due to COVID-19 virus   Dehydration with hypernatremia   Acute cystitis   DNR (do not resuscitate)   Dementia without behavioral disturbance ([place])   Bipolar 1 disorder ([place])   Pneumonia due to COVID-19 virus -Decadron 6 mg daily -Remdesivir (completed) -Vitamins per Covid protocol -Combivent -Titrate O2 to maintain SPO2 > 88% -Prone patient 16 hours/day; if cannot tolerate prone patient 2 to 3 hours per shift.  Acute metabolic encephalopathy -Likely due to AKI and covid-19.  -DC all sedating medication  Chronic systolic and diastolic CHF -[id number] echocardiogram consistent with above diagnosis see results below -Strict in and out -524.8 ml -Daily weight Filed Weights   08/20/19 1352 08/22/19 0630  Weight: 74.8 kg 73.5 kg   Acute on CKD stage IIIa (baseline Cr 1.73) Recent Labs  Lab 08/18/19 0520 08/19/19 0224 08/20/19 0025 08/21/19 0133 08/22/19 0313  CREATININE 2.10*  2.08* 2.01* 1.58* 1.27* 1.29*  -22/18  D5 W 75 ml/hr -2/18 2 amp sodium bicarb -Better than baseline.  Acute cystitis positive E. Coli -UA suggestive of acute cystitis. -Completed 5-day course antibiotics  Dehydration with hypernatremia -Resolved -See acute on CKD stage III.  Essential hypertension -2/21  increase Amlodipine 5 mg daily -Losartan 50 mg daily  Frequent falls -2/18 PT/OT consult A/O x1 (does not know where, when, why).  States lives alone.  Evaluate for SNF  -2/21 patient had been discharged on 2/20 believing she was going to return to [facility], however they do not have an SNF session  today facility.  Plan is for [facility] to [facility] on 2/22  Bradycardia -Possible cause of her frequent falls? -2/19 echocardiogram; consistent with systolic and diastolic CHF -Discussed case with Dr. Thompson Grayer, EP who feels that most likely secondary to patient's acute Covid illness will follow-up with a virtual visit with patient and a couple weeks post discharge.  Alzheimer's dementia with behavioral disturbance ([place]) -Chronic Will need updated MAR from [facility].   Goals of care -2/19 PT/OT consulted: Discharge [facility] on 2/22     DVT prophylaxis: Heparin subcu Code Status: DNR Family Communication:  2/21 left message  Otila Kluver (granddaughter) I had called to give her an update on her grandmothers condition. Disposition Plan:    Consultants:  Phone consult; Discussed case with Dr. Thompson Grayer, EP    Procedures/Significant Events:  2/19 echocardiogram;Left Ventricle: EF =45 to 50%.-The left ventricle demonstrates global hypokinesis. -Grade I diastolic dysfunction (impaired relaxation).  -Pericardium: A small pericardial effusion is present. The pericardial effusion is circumferential. Presence of pericardial fat pad.     I have personally reviewed and interpreted all radiology studies and my findings are as above.  VENTILATOR SETTINGS: Room air 2/21 SPO2; 99%   Cultures 2/16 blood right AC NGTD 2/16 blood LEFT hand NGTD 2/16 urine positive E. Coli 2/17 urine positive E. coli   Antimicrobials: Anti-infectives (From admission, onward)   Start     Dose/Rate Stop   08/21/19 0800  remdesivir 100 mg in sodium chloride 0.9 % 100 mL IVPB     100 mg 200 mL/hr over 30 Minutes 08/21/19 0950   08/21/19 0600  cephALEXin (KEFLEX) capsule 250 mg     250 mg 08/23/19 0559   08/21/19 0000  cephALEXin (KEFLEX) 250 MG capsule     250 mg 08/23/19 2359   08/18/19 1000  remdesivir 100 mg in sodium chloride 0.9 % 100 mL IVPB  Status:  Discontinued     100 mg 200  mL/hr over 30 Minutes 08/20/19 1427   08/18/19 0600  cefTRIAXone (ROCEPHIN) 1 g in sodium chloride 0.9 % 100 mL IVPB  Status:  Discontinued     1 g 200 mL/hr over 30 Minutes 08/20/19 [place] / TUBES:     Continuous Infusions: . dextrose 75 mL/hr at 08/22/19 0227     Objective: Vitals:   08/21/19 2035 08/22/19 0528 08/22/19 0630 08/22/19 0750  BP: 140/81 (!) 164/76  (!) 161/51  Pulse: (!) 58   (!) 49  Resp: 19   16  Temp: 98.7 F (37.1 C) 97.8 F (36.6 C)  97.7 F (36.5 C)  TempSrc: Oral Oral  Oral  SpO2: 98%   97%  Weight:   73.5 kg   Height:        Intake/Output Summary (Last 24 hours) at 08/22/2019 1144 Last data filed at 08/22/2019 0500 Gross per 24 hour  Intake 270 ml  Output 1400 ml  Net -1130 ml   Filed Weights   08/20/19 1352 08/22/19 0630  Weight: 74.8 kg 73.5 kg   Physical Exam:  General: A/O x1 (does not know where, when,  why) pleasantly confused, follows commands.  No acute respiratory distress Eyes: negative scleral hemorrhage, negative anisocoria, negative icterus ENT: Negative Runny nose, negative gingival bleeding, Neck:  Negative scars, masses, torticollis, lymphadenopathy, JVD Lungs: Clear to auscultation bilaterally without wheezes or crackles Cardiovascular: Regular rate and rhythm without murmur gallop or rub normal S1 and S2 Abdomen: negative abdominal pain, nondistended, positive soft, bowel sounds, no rebound, no ascites, no appreciable mass Extremities: No significant cyanosis, clubbing, or edema bilateral lower extremities Skin: Negative rashes, lesions, ulcers Psychiatric:  Negative depression, negative anxiety, negative fatigue, negative mania  Central nervous system:  Cranial nerves II through XII intact, tongue/uvula midline, all extremities muscle strength 5/5, sensation intact throughout, negative dysarthria, negative expressive aphasia, negative receptive aphasia..     Data Reviewed: Care during the  described time interval was provided by me .  I have reviewed this patient's available data, including medical history, events of note, physical examination, and all test results as part of my evaluation.   CBC: Recent Labs  Lab 08/18/19 0520 08/19/19 0224 08/20/19 0025 08/21/19 0133 08/22/19 0313  WBC 5.1 5.1 6.3 6.9 10.7*  NEUTROABS 4.1 3.6 4.6 4.9 6.8  HGB 11.2* 10.1* 10.0* 10.0* 9.9*  HCT 36.2 32.5* 31.6* 31.5* 30.9*  MCV 98.6 98.5 96.6 96.0 96.0  PLT 171 197 201 227 [id number]   Basic Metabolic Panel: Recent Labs  Lab 08/18/19 0520 08/19/19 0224 08/20/19 0025 08/21/19 0133 08/22/19 0313  NA 142 147* 144 141 143  K 4.7 4.5 3.8 4.1 4.1  CL 114* 120* 114* 111 114*  CO2 17* 16* 19* 19* 20*  GLUCOSE 135* 157* 289* 235* 165*  BUN 59* 68* 64* 56* 51*  CREATININE 2.10*  2.08* 2.01* 1.58* 1.27* 1.29*  CALCIUM 8.0* 7.8* 7.6* 7.6* 7.6*  MG 2.6* 2.4 2.3 2.3 2.3  PHOS 4.7* 4.2 2.7 3.5 3.2   GFR: Estimated Creatinine Clearance: 31.7 mL/min (A) (by C-G formula based on SCr of 1.29 mg/dL (H)). Liver Function Tests: Recent Labs  Lab 08/18/19 0520 08/19/19 0224 08/20/19 0025 08/21/19 0133 08/22/19 0313  AST 24 20 51* 36 26  ALT 17 14 48* 48* 40  ALKPHOS 80 71 72 68 64  BILITOT 0.4 0.1* 0.5 0.4 0.3  PROT 7.8 6.8 6.6 6.5 6.0*  ALBUMIN 2.8* 2.4* 2.4* 2.5* 2.4*   Recent Labs  Lab 08/17/19 1201  LIPASE 33   No results for input(s): AMMONIA in the last 168 hours. Coagulation Profile: No results for input(s): INR, PROTIME in the last 168 hours. Cardiac Enzymes: No results for input(s): CKTOTAL, CKMB, CKMBINDEX, TROPONINI in the last 168 hours. BNP (last 3 results) No results for input(s): PROBNP in the last 8760 hours. HbA1C: No results for input(s): HGBA1C in the last 72 hours. CBG: No results for input(s): GLUCAP in the last 168 hours. Lipid Profile: No results for input(s): CHOL, HDL, LDLCALC, TRIG, CHOLHDL, LDLDIRECT in the last 72 hours. Thyroid Function Tests: No  results for input(s): TSH, T4TOTAL, FREET4, T3FREE, THYROIDAB in the last 72 hours. Anemia Panel: Recent Labs    08/21/19 0133 08/22/19 0313  FERRITIN 658* 375*   Urine analysis:    Component Value Date/Time   COLORURINE YELLOW (A) 08/17/2019 1909   APPEARANCEUR HAZY (A) 08/17/2019 1909   LABSPEC 1.015 08/17/2019 1909   PHURINE 5.0 08/17/2019 1909   GLUCOSEU NEGATIVE 08/17/2019 1909   HGBUR NEGATIVE 08/17/2019 1909   BILIRUBINUR NEGATIVE 08/17/2019 [place] NEGATIVE 08/17/2019 1909   PROTEINUR 30 (A) 08/17/2019  1909   NITRITE POSITIVE (A) 08/17/2019 1909   LEUKOCYTESUR MODERATE (A) 08/17/2019 1909   Sepsis Labs: @LABRCNTIP (procalcitonin:4,lacticidven:4)  ) Recent Results (from the past 240 hour(s))  Culture, blood (Routine X 2) w Reflex to ID Panel     Status: None   Collection Time: 08/17/19  7:08 PM   Specimen: BLOOD  Result Value Ref Range Status   Specimen Description BLOOD RIGHT ANTECUBITAL  Final   Special Requests   Final    BOTTLES DRAWN AEROBIC AND ANAEROBIC Blood Culture adequate volume   Culture   Final    NO GROWTH 5 DAYS Performed at [facility], [address] [place], [place] [address]    Report Status 08/22/2019 FINAL  Final  Urine culture     Status: Abnormal   Collection Time: 08/17/19  7:09 PM   Specimen: Urine, Random  Result Value Ref Range Status   Specimen Description   Final    URINE, RANDOM Performed at [facility], [address] [place], [place] [address]    Special Requests   Final    NONE Performed at [facility], [address] [place], [place] [address]    Culture >=100,000 COLONIES/mL ESCHERICHIA COLI (A)  Final   Report Status 08/22/2019 FINAL  Final   Organism ID, Bacteria ESCHERICHIA COLI (A)  Final      Susceptibility   Escherichia coli - MIC*    AMPICILLIN <=2 SENSITIVE Sensitive     CEFAZOLIN <=4 SENSITIVE Sensitive     CEFTRIAXONE <=0.25 SENSITIVE Sensitive     CIPROFLOXACIN  <=0.25 SENSITIVE Sensitive     GENTAMICIN <=1 SENSITIVE Sensitive     IMIPENEM <=0.25 SENSITIVE Sensitive     NITROFURANTOIN 32 SENSITIVE Sensitive     TRIMETH/SULFA <=20 SENSITIVE Sensitive     AMPICILLIN/SULBACTAM <=2 SENSITIVE Sensitive     PIP/TAZO <=4 SENSITIVE Sensitive     * >=100,000 COLONIES/mL ESCHERICHIA COLI  Culture, blood (Routine X 2) w Reflex to ID Panel     Status: None   Collection Time: 08/17/19  7:09 PM   Specimen: BLOOD  Result Value Ref Range Status   Specimen Description BLOOD BLOOD LEFT HAND  Final   Special Requests   Final    BOTTLES DRAWN AEROBIC AND ANAEROBIC Blood Culture adequate volume   Culture   Final    NO GROWTH 5 DAYS Performed at [facility], [address] [place], [place] [address]    Report Status 08/22/2019 FINAL  Final  Culture, Urine     Status: Abnormal   Collection Time: 08/18/19  9:12 AM   Specimen: Urine, Clean Catch  Result Value Ref Range Status   Specimen Description   Final    URINE, CLEAN CATCH Performed at [facility], [place] [address] [place], [place] [address]    Special Requests   Final    NONE Performed at [facility], [place] [address] [place], [place] [address]    Culture 50,000 COLONIES/mL ESCHERICHIA COLI (A)  Final   Report Status 08/21/2019 FINAL  Final   Organism ID, Bacteria ESCHERICHIA COLI (A)  Final      Susceptibility   Escherichia coli - MIC*    AMPICILLIN <=2 SENSITIVE Sensitive     CEFAZOLIN <=4 SENSITIVE Sensitive     CEFTRIAXONE <=0.25 SENSITIVE Sensitive     CIPROFLOXACIN <=0.25 SENSITIVE Sensitive     GENTAMICIN <=1 SENSITIVE Sensitive     IMIPENEM <=0.25 SENSITIVE Sensitive  NITROFURANTOIN <=16 SENSITIVE Sensitive     TRIMETH/SULFA <=20 SENSITIVE Sensitive     AMPICILLIN/SULBACTAM <=2 SENSITIVE Sensitive     PIP/TAZO <=4 SENSITIVE Sensitive     * 50,000 COLONIES/mL ESCHERICHIA COLI         Radiology Studies: No results  found.      Scheduled Meds: . amLODipine  2.5 mg Oral Daily  . vitamin C  500 mg Oral Daily  . cephALEXin  250 mg Oral Q8H  . dexamethasone  6 mg Oral Q24H  . donepezil  5 mg Oral QHS  . heparin  5,000 Units Subcutaneous Q8H  . Ipratropium-Albuterol  1 puff Inhalation QID  . losartan  50 mg Oral Daily  . mirtazapine  7.5 mg Oral QHS  . risperiDONE  0.75 mg Oral QHS   And  . risperiDONE  0.5 mg Oral Daily  . zinc sulfate  220 mg Oral Daily   Continuous Infusions: . dextrose 75 mL/hr at 08/22/19 0227     LOS: 4 days   The patient is critically ill with multiple organ systems failure and requires high complexity decision making for assessment and support, frequent evaluation and titration of therapies, application of advanced monitoring technologies and extensive interpretation of multiple databases. Critical Care Time devoted to patient care services described in this note  Time spent: 40 minutes     Adeeb Konecny, Geraldo Docker, MD [facility] Pager [phone]  If 7PM-7AM, please contact night-coverage [email or web address] Password [facility] 08/22/2019, 11:44 AM

## 2019-08-23 NOTE — TOC Progression Note (Signed)
Transition of Care [facility]) - Progression Note    Patient Details  Name: Leah Waller MRN: [id number] Date of Birth: 09-Apr-1935  [facility] CM/SW Contact  Leah Courts, RN Phone Number: 08/23/2019, 3:46 PM  Clinical Narrative:    CM confirmed that Leah Waller Waller will extend bed offer to patient.  Patient's daughter Leah Waller was made aware of this and accepts bed offer.  [facility] is pending, went to manual review and all requested information was submitted.  Leah Waller has bed availability to accept patient and rep has started Leah Waller.  CM following for [facility] number to be issued in order for patient to be dc to Leah Waller.     Expected Discharge Plan: Skilled Nursing Facility Barriers to Discharge: [facility], [place] (PASRR)  Expected Discharge Plan and Services Expected Discharge Plan: [place] Choice: [facility] arrangements for the past 2 months: Leah Waller Expected Discharge Date: 08/21/19                                     Social Determinants of Health (SDOH) Interventions    Readmission Risk Interventions No flowsheet data found.

## 2019-08-23 NOTE — Progress Notes (Signed)
Physical Therapy Treatment Patient Details Name: Leah Waller MRN: [id number] DOB: Nov 26, 1934 Today's Date: 08/23/2019    History of Present Illness 85 y.o. female admitted on 08/18/19 for fall and weakness.  Found to have COVID 19 PNA.  CT of head and c-spine negative for fx, x-ray of L hip/pelvis also negative for fx. Other dx include acute metabolic encepholopathy superimposed on baseline Alzheimer's dementia, AKI, acute cystitis (positive e-coli), dehydration with hypernatremia, bradycardia.  Pt with other significant PMH of HTN, bipolar d/o, CKD III, chronic systolic and diastolic CHF.     PT Comments    Patient in bed when PT arrived. She agreed to perform ex , but did not want to walk. Finally agreed to walk with PT if we came back in afternoon. She performed LE as outlined with A, AA ROM format. Good return demo for each. Discussed the importance of performing these in between PT sessions. Should benefit from ongoing PT while here as she is at high risk for falls, and exhibits poor postural awareness.   Follow Up Recommendations  SNF;Supervision/Assistance - 24 hour     Equipment Recommendations       Recommendations for Other Services       Precautions / Restrictions Precautions Precautions: Fall Precaution Comments: reportedly increased falls at home Restrictions Weight Bearing Restrictions: No    Mobility  Bed Mobility Overal bed mobility: Needs Assistance Bed Mobility: Supine to Sit     Supine to sit: Mod assist Sit to supine: Mod assist   General bed mobility comments: Mod assist to help lift both legs back into bed. (Practiced ex only this session, but she agreed to walk this afternoon)  Transfers                    Ambulation/Gait                 Stairs             Wheelchair Mobility    Modified Rankin (Stroke Patients Only)       Balance Overall balance assessment: Needs assistance Sitting-balance support: Feet  supported;Bilateral upper extremity supported Sitting balance-Leahy Scale: Fair     Standing balance support: Bilateral upper extremity supported Standing balance-Leahy Scale: Poor Standing balance comment: needs AD and support from therapist.                             Cognition Arousal/Alertness: Awake/alert   Overall Cognitive Status: History of cognitive impairments - at baseline                                 General Comments: No one to determine if current affect and presentation is normal, but baseline Alzheimer's dementia.       Exercises General Exercises - Lower Extremity Ankle Circles/Pumps: AROM;Supine;AAROM Quad Sets: AROM;Supine;AAROM Hip ABduction/ADduction: AROM;Supine;AAROM Straight Leg Raises: AROM;AAROM;Supine    General Comments        Pertinent Vitals/Pain Pain Assessment: No/denies pain    Home Living                      Prior Function            PT Goals (current goals can now be found in the care plan section) Acute Rehab PT Goals Patient Stated Goal: pt wanted to use the [facility] PT Goal Formulation: Patient  unable to participate in goal setting Time For Goal Achievement: 09/03/19 Potential to Achieve Goals: Good Progress towards PT goals: PT to reassess next treatment(Fluctuates in levels of participation)    Frequency    Min 3X/week      PT Plan      Co-evaluation              AM-PAC PT "6 Clicks" Mobility   Outcome Measure    Help needed moving from lying on your back to sitting on the side of a flat bed without using bedrails?: A Little Help needed moving to and from a bed to a chair (including a wheelchair)?: A Lot Help needed standing up from a chair using your arms (e.g., wheelchair or bedside chair)?: A Little Help needed to walk in hospital room?: A Lot Help needed climbing 3-5 steps with a railing? : A Lot 6 Click Score: 12    End of Session   Activity Tolerance: Patient  limited by fatigue Patient left: in bed;with call bell/phone within reach;with bed alarm set Nurse Communication: Mobility status PT Visit Diagnosis: Muscle weakness (generalized) (M62.81);Difficulty in walking, not elsewhere classified (R26.2);Repeated falls (R29.6)     Time: [id number] PT Time Calculation (min) (ACUTE ONLY): 22 min  Charges:  $Therapeutic Exercise: 8-22 mins                   Leah Waller, PT # [phone] CGV cell   Leah Waller 08/23/2019, 3:51 PM

## 2019-08-23 NOTE — Progress Notes (Signed)
Spoke to the patient's daughter and gave updates. She left a message with the case manager to call her back about discharge.

## 2019-08-23 NOTE — Progress Notes (Signed)
Physical Therapy Treatment Patient Details Name: Leah Waller MRN: [id number] DOB: June 02, 1935 Today's Date: 08/23/2019    History of Present Illness 85 y.o. female admitted on 08/18/19 for fall and weakness.  Found to have COVID 19 PNA.  CT of head and c-spine negative for fx, x-ray of L hip/pelvis also negative for fx. Other dx include acute metabolic encepholopathy superimposed on baseline Alzheimer's dementia, AKI, acute cystitis (positive e-coli), dehydration with hypernatremia, bradycardia.  Pt with other significant PMH of HTN, bipolar d/o, CKD III, chronic systolic and diastolic CHF.     PT Comments    Second visit in accordance with patient's agreement to walk in the afternoon. She was in [facility] chair when PT arrived. Required assist of 2 for sit<>stand - reduced postural awareness, and unsteady. RW and able to ambulate from [facility] chair - and once door open, she stood momentarily peering into hallway, but unable to process to keep walking-- so agreed to turn around and walk back to the [facility] chair. Once back in chair, chair alarm activated, remote/call light in reach, blanket over lap and legs elevated. Should continue to benefit from PT while hospitalized to address goals for optimal functional outcomes and reduce risk of falls.  Follow Up Recommendations  SNF;Supervision/Assistance - 24 hour     Equipment Recommendations  Rolling walker with 5" wheels;3in1 (PT)    Recommendations for Other Services       Precautions / Restrictions Precautions Precautions: Fall Precaution Comments: reportedly increased falls at home Restrictions Weight Bearing Restrictions: No    Mobility  Bed Mobility Overal bed mobility: Needs Assistance Bed Mobility: Supine to Sit     Supine to sit: Mod assist;Min assist Sit to supine: Mod assist;Min assist   General bed mobility comments: Mod assist to help lift both legs back into bed. (Practiced ex only this session, but she agreed to walk this  afternoon)  Transfers Overall transfer level: Needs assistance Equipment used: Rolling walker (2 wheeled) Transfers: Sit to/from Stand Sit to Stand: Min guard;Min assist;+2 safety/equipment         General transfer comment: Heavy min assist to stand from elevated recliner and lower BSC.   Ambulation/Gait Ambulation/Gait assistance: Min assist Gait Distance (Feet): 28 Feet Assistive device: Rolling walker (2 wheeled) Gait Pattern/deviations: Step-through pattern;Shuffle;Staggering left;Staggering right(Poor postural awareness)     General Gait Details: Even with RW pt with staggering gait pattern, hand over hand assist for placement of hands on RW handles, pt very unsteady even with support of the RW.     Stairs             Wheelchair Mobility    Modified Rankin (Stroke Patients Only)       Balance Overall balance assessment: Needs assistance Sitting-balance support: Feet supported;Bilateral upper extremity supported Sitting balance-Leahy Scale: Fair     Standing balance support: Bilateral upper extremity supported Standing balance-Leahy Scale: Poor(P+, F) Standing balance comment: needs AD and support from therapist.                             Cognition Arousal/Alertness: Awake/alert Behavior During Therapy: Flat affect Overall Cognitive Status: History of cognitive impairments - at baseline                                 General Comments: No one to determine if current affect and presentation is normal, but baseline  Alzheimer's dementia.       Exercises General Exercises - Lower Extremity Ankle Circles/Pumps: AROM;Supine;AAROM Quad Sets: AROM;Supine;AAROM Hip ABduction/ADduction: AROM;Supine;AAROM Straight Leg Raises: AROM;AAROM;Supine    General Comments        Pertinent Vitals/Pain Pain Assessment: No/denies pain    Home Living                      Prior Function            PT Goals (current goals  can now be found in the care plan section) Acute Rehab PT Goals Patient Stated Goal: Could not process to determine a "goal"- wants to walk, but says she does "not walk a lot at home" PT Goal Formulation: Patient unable to participate in goal setting Time For Goal Achievement: 09/03/19 Potential to Achieve Goals: Good Progress towards PT goals: PT to reassess next treatment    Frequency    Min 3X/week      PT Plan Current plan remains appropriate    Co-evaluation              AM-PAC PT "6 Clicks" Mobility   Outcome Measure  Help needed turning from your back to your side while in a flat bed without using bedrails?: A Lot Help needed moving from lying on your back to sitting on the side of a flat bed without using bedrails?: A Little Help needed moving to and from a bed to a chair (including a wheelchair)?: A Lot Help needed standing up from a chair using your arms (e.g., wheelchair or bedside chair)?: A Lot Help needed to walk in hospital room?: A Lot Help needed climbing 3-5 steps with a railing? : A Lot 6 Click Score: 13    End of Session   Activity Tolerance: Patient limited by fatigue Patient left: in chair;with call bell/phone within reach;with chair alarm set Nurse Communication: Mobility status PT Visit Diagnosis: Muscle weakness (generalized) (M62.81);Difficulty in walking, not elsewhere classified (R26.2);Repeated falls (R29.6)     Time: [id number] PT Time Calculation (min) (ACUTE ONLY): 21 min  Charges:  $Gait Training: 8-22 mins $Therapeutic Exercise: 8-22 mins                    Rollen Sox, PT # [phone] CGV cell  Casandra Doffing 08/23/2019, 4:10 PM

## 2019-08-23 NOTE — Progress Notes (Signed)
Name: Leah Waller DOB: 09/21/1934  Please be advised that the above-named patient will require a short-term nursing home stay - anticipated 30 days or less for rehabilitation and strengthening.  The plan is for return home.

## 2019-08-23 NOTE — Progress Notes (Signed)
PROGRESS NOTE    Leah Waller  [id number] DOB: 1935/04/01 DOA: 08/18/2019 PCP: Orvis Brill, [facility]   Brief Narrative:  Leah Waller is a 85 y.o. BF (from [place]) PMHx Dementia, Bipolar, Anxiety, HTN, iron deficiency anemia, CKD stage III  Presents with fall and weakness.  Patient has dementia, and is unable to provide accurate medical history. I called her daughter who also dose not know what happened to patient in detail since that she is not living with patient, therefore, most of the history is obtained by discussing the case with ED physician, per EMS report, and with the nursing staff.  Per report, pt was found to have decreased level of alertness, generalized weakness.  Normally she is ambulatory and conversant, however at bedside notes that patient has been more confused and somnolent for the past 2 days. She is unable to stand or ambulate.  Has decreased oral intake. Patient has had multiple falls over the last week or 2.They note that she appears to stop moving her left leg since yesterday. Patient indicates some throat discomfort but unable to describe any other specific symptoms.  Does not seem to have chest pain or abdominal pain.  No active nausea vomiting, diarrhea noted.  No active cough noted.  ED Course: pt was found to have positive Covid 19 Ag test, WBC 7.0, worsening renal function, temperature normal, blood pressure 123/61, heart rate 63, RR 25, oxygen saturation 93-95% on RA, chest x-ray showed bilateral infiltration.  CT of the head is negative for acute intracranial abnormalities.  CT of C-spine is negative for bony fracture.  X-ray of left hip/pelvis is negative for bony fracture.  Patient is admitted to [facility] bed as inpatient.   Subjective: 12/20 2A/O x1 (does not know where, when, why).  Pleasantly confused.   Assessment & Plan:   Principal Problem:   Pneumonia due to COVID-19 virus Active Problems:  Alzheimer's dementia with behavioral disturbance ([place])   Essential hypertension   CKD (chronic kidney disease) stage 3, GFR 30-59 ml/min   COVID-19 virus infection   Acute metabolic encephalopathy   Frequent falls   Acute renal failure superimposed on stage 3a chronic kidney disease (HCC)   Acute respiratory disease due to COVID-19 virus   Dehydration with hypernatremia   Acute cystitis   DNR (do not resuscitate)   Dementia without behavioral disturbance ([place])   Bipolar 1 disorder ([place])   Pneumonia due to COVID-19 virus -Decadron 6 mg daily -Remdesivir (completed) -Vitamins per Covid protocol -Combivent -Titrate O2 to maintain SPO2 > 88% -Prone patient 16 hours/day; if cannot tolerate prone patient 2 to 3 hours per shift.  Acute metabolic encephalopathy -Likely due to AKI and covid-19.  -DC all sedating medication  Chronic systolic and diastolic CHF -[id number] echocardiogram consistent with above diagnosis see results below -Strict in and out -1.3 L -Daily weight Filed Weights   08/20/19 1352 08/22/19 0630 08/23/19 0421  Weight: 74.8 kg 73.5 kg 73.5 kg   Acute on CKD stage IIIa (baseline Cr 1.73) Recent Labs  Lab 08/18/19 0520 08/19/19 0224 08/20/19 0025 08/21/19 0133 08/22/19 0313  CREATININE 2.10*  2.08* 2.01* 1.58* 1.27* 1.29*  -22/18  D5 W 75 ml/hr -2/18 2 amp sodium bicarb -Better than baseline.  Acute cystitis positive E. Coli -UA suggestive of acute cystitis. -Completed 5-day course antibiotics  Dehydration with hypernatremia -Resolved -See acute on CKD stage III.  Essential hypertension -2/21 increase Amlodipine 5 mg daily -Losartan 50 mg daily  Frequent  falls -2/18 PT/OT consult A/O x1 (does not know where, when, why).  States lives alone.  Evaluate for SNF  -2/21 patient had been discharged on 2/20 believing she was going to return to [facility], however they do not have an SNF session today facility.  Plan is for discharge to [facility]; 2/22  still awaiting clearance  Bradycardia -Possible cause of her frequent falls? -2/19 echocardiogram; consistent with systolic and diastolic CHF -Discussed case with Dr. Thompson Grayer, EP who feels that most likely secondary to patient's acute Covid illness will follow-up with a virtual visit with patient and a couple weeks post discharge.  Alzheimer's dementia with behavioral disturbance ([place]) -Chronic Will need updated MAR from [facility].   Goals of care -2/19 PT/OT consulted: Discharge [facility] on 2/23?     DVT prophylaxis: Heparin subcu Code Status: DNR Family Communication:  2/21 left message  Otila Kluver (granddaughter) I had called to give her an update on her grandmothers condition. Disposition Plan:    Consultants:  Phone consult; Discussed case with Dr. Thompson Grayer, EP    Procedures/Significant Events:  2/19 echocardiogram;Left Ventricle: EF =45 to 50%.-The left ventricle demonstrates global hypokinesis. -Grade I diastolic dysfunction (impaired relaxation).  -Pericardium: A small pericardial effusion is present. The pericardial effusion is circumferential. Presence of pericardial fat pad.     I have personally reviewed and interpreted all radiology studies and my findings are as above.  VENTILATOR SETTINGS: Room air 2/21 SPO2; 99%   Cultures 2/16 blood right AC NGTD 2/16 blood LEFT hand NGTD 2/16 urine positive E. Coli 2/17 urine positive E. coli   Antimicrobials: Anti-infectives (From admission, onward)   Start     Dose/Rate Stop   08/21/19 0800  remdesivir 100 mg in sodium chloride 0.9 % 100 mL IVPB     100 mg 200 mL/hr over 30 Minutes 08/21/19 0950   08/21/19 0600  cephALEXin (KEFLEX) capsule 250 mg     250 mg 08/23/19 0559   08/21/19 0000  cephALEXin (KEFLEX) 250 MG capsule     250 mg 08/23/19 2359   08/18/19 1000  remdesivir 100 mg in sodium chloride 0.9 % 100 mL IVPB  Status:  Discontinued     100 mg 200 mL/hr over 30 Minutes 08/20/19  1427   08/18/19 0600  cefTRIAXone (ROCEPHIN) 1 g in sodium chloride 0.9 % 100 mL IVPB  Status:  Discontinued     1 g 200 mL/hr over 30 Minutes 08/20/19 [place] / TUBES:     Continuous Infusions:    Objective: Vitals:   08/23/19 0629 08/23/19 0634 08/23/19 0745 08/23/19 1156  BP:  (!) 182/51 (!) 163/58 (!) 140/56  Pulse: 75  (!) 48 (!) 58  Resp: 17   16  Temp: 97.6 F (36.4 C)  98.1 F (36.7 C) 98.6 F (37 C)  TempSrc: Oral  Oral Oral  SpO2: 99%     Weight:      Height:        Intake/Output Summary (Last 24 hours) at 08/23/2019 1336 Last data filed at 08/23/2019 0259 Gross per 24 hour  Intake 340 ml  Output 1200 ml  Net -860 ml   Filed Weights   08/20/19 1352 08/22/19 0630 08/23/19 0421  Weight: 74.8 kg 73.5 kg 73.5 kg   Physical Exam:  General: A/O x1, (does not know where, when, why) pleasantly confused, no acute respiratory distress Eyes: negative scleral hemorrhage, negative anisocoria,  negative icterus ENT: Negative Runny nose, negative gingival bleeding, Neck:  Negative scars, masses, torticollis, lymphadenopathy, JVD Lungs: Clear to auscultation bilaterally without wheezes or crackles Cardiovascular: Regular rate and rhythm without murmur gallop or rub normal S1 and S2 Abdomen: negative abdominal pain, nondistended, positive soft, bowel sounds, no rebound, no ascites, no appreciable mass Extremities: No significant cyanosis, clubbing, or edema bilateral lower extremities Skin: Negative rashes, lesions, ulcers Psychiatric:  Negative depression, negative anxiety, negative fatigue, negative mania  Central nervous system:  Cranial nerves II through XII intact, tongue/uvula midline, all extremities muscle strength 5/5, sensation intact throughout, negative dysarthria, negative expressive aphasia, negative receptive aphasia.    Data Reviewed: Care during the described time interval was provided by me .  I have reviewed this patient's  available data, including medical history, events of note, physical examination, and all test results as part of my evaluation.   CBC: Recent Labs  Lab 08/18/19 0520 08/19/19 0224 08/20/19 0025 08/21/19 0133 08/22/19 0313  WBC 5.1 5.1 6.3 6.9 10.7*  NEUTROABS 4.1 3.6 4.6 4.9 6.8  HGB 11.2* 10.1* 10.0* 10.0* 9.9*  HCT 36.2 32.5* 31.6* 31.5* 30.9*  MCV 98.6 98.5 96.6 96.0 96.0  PLT 171 197 201 227 [id number]   Basic Metabolic Panel: Recent Labs  Lab 08/18/19 0520 08/19/19 0224 08/20/19 0025 08/21/19 0133 08/22/19 0313  NA 142 147* 144 141 143  K 4.7 4.5 3.8 4.1 4.1  CL 114* 120* 114* 111 114*  CO2 17* 16* 19* 19* 20*  GLUCOSE 135* 157* 289* 235* 165*  BUN 59* 68* 64* 56* 51*  CREATININE 2.10*  2.08* 2.01* 1.58* 1.27* 1.29*  CALCIUM 8.0* 7.8* 7.6* 7.6* 7.6*  MG 2.6* 2.4 2.3 2.3 2.3  PHOS 4.7* 4.2 2.7 3.5 3.2   GFR: Estimated Creatinine Clearance: 31.7 mL/min (A) (by C-G formula based on SCr of 1.29 mg/dL (H)). Liver Function Tests: Recent Labs  Lab 08/18/19 0520 08/19/19 0224 08/20/19 0025 08/21/19 0133 08/22/19 0313  AST 24 20 51* 36 26  ALT 17 14 48* 48* 40  ALKPHOS 80 71 72 68 64  BILITOT 0.4 0.1* 0.5 0.4 0.3  PROT 7.8 6.8 6.6 6.5 6.0*  ALBUMIN 2.8* 2.4* 2.4* 2.5* 2.4*   Recent Labs  Lab 08/17/19 1201  LIPASE 33   No results for input(s): AMMONIA in the last 168 hours. Coagulation Profile: No results for input(s): INR, PROTIME in the last 168 hours. Cardiac Enzymes: No results for input(s): CKTOTAL, CKMB, CKMBINDEX, TROPONINI in the last 168 hours. BNP (last 3 results) No results for input(s): PROBNP in the last 8760 hours. HbA1C: No results for input(s): HGBA1C in the last 72 hours. CBG: No results for input(s): GLUCAP in the last 168 hours. Lipid Profile: No results for input(s): CHOL, HDL, LDLCALC, TRIG, CHOLHDL, LDLDIRECT in the last 72 hours. Thyroid Function Tests: No results for input(s): TSH, T4TOTAL, FREET4, T3FREE, THYROIDAB in the last 72  hours. Anemia Panel: Recent Labs    08/21/19 0133 08/22/19 0313  FERRITIN 658* 375*   Urine analysis:    Component Value Date/Time   COLORURINE YELLOW (A) 08/17/2019 1909   APPEARANCEUR HAZY (A) 08/17/2019 1909   LABSPEC 1.015 08/17/2019 1909   PHURINE 5.0 08/17/2019 1909   GLUCOSEU NEGATIVE 08/17/2019 1909   HGBUR NEGATIVE 08/17/2019 1909   BILIRUBINUR NEGATIVE 08/17/2019 1909   KETONESUR NEGATIVE 08/17/2019 1909   PROTEINUR 30 (A) 08/17/2019 1909   NITRITE POSITIVE (A) 08/17/2019 1909   LEUKOCYTESUR MODERATE (A) 08/17/2019 1909  Sepsis Labs: @LABRCNTIP (procalcitonin:4,lacticidven:4)  ) Recent Results (from the past 240 hour(s))  Culture, blood (Routine X 2) w Reflex to ID Panel     Status: None   Collection Time: 08/17/19  7:08 PM   Specimen: BLOOD  Result Value Ref Range Status   Specimen Description BLOOD RIGHT ANTECUBITAL  Final   Special Requests   Final    BOTTLES DRAWN AEROBIC AND ANAEROBIC Blood Culture adequate volume   Culture   Final    NO GROWTH 5 DAYS Performed at [facility], [address] [place], [place] [address]    Report Status 08/22/2019 FINAL  Final  Urine culture     Status: Abnormal   Collection Time: 08/17/19  7:09 PM   Specimen: Urine, Random  Result Value Ref Range Status   Specimen Description   Final    URINE, RANDOM Performed at [facility], [address] [place], [place] [address]    Special Requests   Final    NONE Performed at [facility], [address], [place], [place] [address]    Culture >=100,000 COLONIES/mL ESCHERICHIA COLI (A)  Final   Report Status 08/22/2019 FINAL  Final   Organism ID, Bacteria ESCHERICHIA COLI (A)  Final      Susceptibility   Escherichia coli - MIC*    AMPICILLIN <=2 SENSITIVE Sensitive     CEFAZOLIN <=4 SENSITIVE Sensitive     CEFTRIAXONE <=0.25 SENSITIVE Sensitive     CIPROFLOXACIN <=0.25 SENSITIVE Sensitive     GENTAMICIN <=1 SENSITIVE Sensitive      IMIPENEM <=0.25 SENSITIVE Sensitive     NITROFURANTOIN 32 SENSITIVE Sensitive     TRIMETH/SULFA <=20 SENSITIVE Sensitive     AMPICILLIN/SULBACTAM <=2 SENSITIVE Sensitive     PIP/TAZO <=4 SENSITIVE Sensitive     * >=100,000 COLONIES/mL ESCHERICHIA COLI  Culture, blood (Routine X 2) w Reflex to ID Panel     Status: None   Collection Time: 08/17/19  7:09 PM   Specimen: BLOOD  Result Value Ref Range Status   Specimen Description BLOOD BLOOD LEFT HAND  Final   Special Requests   Final    BOTTLES DRAWN AEROBIC AND ANAEROBIC Blood Culture adequate volume   Culture   Final    NO GROWTH 5 DAYS Performed at [facility], [address] [place], [place] [address]    Report Status 08/22/2019 FINAL  Final  Culture, Urine     Status: Abnormal   Collection Time: 08/18/19  9:12 AM   Specimen: Urine, Clean Catch  Result Value Ref Range Status   Specimen Description   Final    URINE, CLEAN CATCH Performed at [facility], [place] [address] [place], [place] [address]    Special Requests   Final    NONE Performed at [facility], [place] [address], [place], [place] [address]    Culture 50,000 COLONIES/mL ESCHERICHIA COLI (A)  Final   Report Status 08/21/2019 FINAL  Final   Organism ID, Bacteria ESCHERICHIA COLI (A)  Final      Susceptibility   Escherichia coli - MIC*    AMPICILLIN <=2 SENSITIVE Sensitive     CEFAZOLIN <=4 SENSITIVE Sensitive     CEFTRIAXONE <=0.25 SENSITIVE Sensitive     CIPROFLOXACIN <=0.25 SENSITIVE Sensitive     GENTAMICIN <=1 SENSITIVE Sensitive     IMIPENEM <=0.25 SENSITIVE Sensitive     NITROFURANTOIN <=16 SENSITIVE Sensitive     TRIMETH/SULFA <=20 SENSITIVE Sensitive  AMPICILLIN/SULBACTAM <=2 SENSITIVE Sensitive     PIP/TAZO <=4 SENSITIVE Sensitive     * 50,000 COLONIES/mL ESCHERICHIA COLI         Radiology Studies: No results found.      Scheduled Meds: . amLODipine  5 mg Oral Daily  . vitamin C  500 mg  Oral Daily  . dexamethasone  6 mg Oral Q24H  . donepezil  5 mg Oral QHS  . heparin  5,000 Units Subcutaneous Q8H  . Ipratropium-Albuterol  1 puff Inhalation QID  . losartan  50 mg Oral Daily  . mirtazapine  7.5 mg Oral QHS  . risperiDONE  0.75 mg Oral QHS   And  . risperiDONE  0.5 mg Oral Daily  . zinc sulfate  220 mg Oral Daily   Continuous Infusions:    LOS: 5 days   The patient is critically ill with multiple organ systems failure and requires high complexity decision making for assessment and support, frequent evaluation and titration of therapies, application of advanced monitoring technologies and extensive interpretation of multiple databases. Critical Care Time devoted to patient care services described in this note  Time spent: 40 minutes     Dam Ashraf, Geraldo Docker, MD [facility] Pager [phone]  If 7PM-7AM, please contact night-coverage [email or web address] Password [facility] 08/23/2019, 1:36 PM

## 2019-08-23 NOTE — Progress Notes (Signed)
Leah Waller Screws DOB 1935/02/03  Please be advised that the above-named patient has a primary diagnosis of dementia which supersedes any psychiatric diagnosis.

## 2019-08-24 DIAGNOSIS — R0902 Hypoxemia: Secondary | ICD-10-CM | POA: Diagnosis not present

## 2019-08-24 DIAGNOSIS — N3000 Acute cystitis without hematuria: Secondary | ICD-10-CM | POA: Diagnosis not present

## 2019-08-24 DIAGNOSIS — R0682 Tachypnea, not elsewhere classified: Secondary | ICD-10-CM | POA: Diagnosis not present

## 2019-08-24 DIAGNOSIS — R2681 Unsteadiness on feet: Secondary | ICD-10-CM | POA: Diagnosis not present

## 2019-08-24 DIAGNOSIS — D649 Anemia, unspecified: Secondary | ICD-10-CM | POA: Diagnosis not present

## 2019-08-24 DIAGNOSIS — E785 Hyperlipidemia, unspecified: Secondary | ICD-10-CM | POA: Diagnosis present

## 2019-08-24 DIAGNOSIS — D539 Nutritional anemia, unspecified: Secondary | ICD-10-CM | POA: Diagnosis present

## 2019-08-24 DIAGNOSIS — R1311 Dysphagia, oral phase: Secondary | ICD-10-CM | POA: Diagnosis not present

## 2019-08-24 DIAGNOSIS — B948 Sequelae of other specified infectious and parasitic diseases: Secondary | ICD-10-CM | POA: Diagnosis not present

## 2019-08-24 DIAGNOSIS — D631 Anemia in chronic kidney disease: Secondary | ICD-10-CM | POA: Diagnosis present

## 2019-08-24 DIAGNOSIS — R296 Repeated falls: Secondary | ICD-10-CM | POA: Diagnosis not present

## 2019-08-24 DIAGNOSIS — F419 Anxiety disorder, unspecified: Secondary | ICD-10-CM | POA: Diagnosis not present

## 2019-08-24 DIAGNOSIS — R54 Age-related physical debility: Secondary | ICD-10-CM | POA: Diagnosis present

## 2019-08-24 DIAGNOSIS — G309 Alzheimer's disease, unspecified: Secondary | ICD-10-CM | POA: Diagnosis present

## 2019-08-24 DIAGNOSIS — E875 Hyperkalemia: Secondary | ICD-10-CM | POA: Diagnosis present

## 2019-08-24 DIAGNOSIS — R0600 Dyspnea, unspecified: Secondary | ICD-10-CM | POA: Diagnosis not present

## 2019-08-24 DIAGNOSIS — F0150 Vascular dementia without behavioral disturbance: Secondary | ICD-10-CM | POA: Diagnosis not present

## 2019-08-24 DIAGNOSIS — U071 COVID-19: Secondary | ICD-10-CM | POA: Diagnosis not present

## 2019-08-24 DIAGNOSIS — R4182 Altered mental status, unspecified: Secondary | ICD-10-CM | POA: Diagnosis not present

## 2019-08-24 DIAGNOSIS — R7981 Abnormal blood-gas level: Secondary | ICD-10-CM | POA: Diagnosis not present

## 2019-08-24 DIAGNOSIS — I428 Other cardiomyopathies: Secondary | ICD-10-CM | POA: Diagnosis not present

## 2019-08-24 DIAGNOSIS — Z9181 History of falling: Secondary | ICD-10-CM | POA: Diagnosis not present

## 2019-08-24 DIAGNOSIS — M6281 Muscle weakness (generalized): Secondary | ICD-10-CM | POA: Diagnosis not present

## 2019-08-24 DIAGNOSIS — I5042 Chronic combined systolic (congestive) and diastolic (congestive) heart failure: Secondary | ICD-10-CM | POA: Diagnosis present

## 2019-08-24 DIAGNOSIS — J690 Pneumonitis due to inhalation of food and vomit: Secondary | ICD-10-CM | POA: Diagnosis not present

## 2019-08-24 DIAGNOSIS — M47812 Spondylosis without myelopathy or radiculopathy, cervical region: Secondary | ICD-10-CM | POA: Diagnosis not present

## 2019-08-24 DIAGNOSIS — F0280 Dementia in other diseases classified elsewhere without behavioral disturbance: Secondary | ICD-10-CM | POA: Diagnosis present

## 2019-08-24 DIAGNOSIS — I2699 Other pulmonary embolism without acute cor pulmonale: Secondary | ICD-10-CM | POA: Diagnosis not present

## 2019-08-24 DIAGNOSIS — R6520 Severe sepsis without septic shock: Secondary | ICD-10-CM | POA: Diagnosis not present

## 2019-08-24 DIAGNOSIS — I10 Essential (primary) hypertension: Secondary | ICD-10-CM | POA: Diagnosis not present

## 2019-08-24 DIAGNOSIS — R69 Illness, unspecified: Secondary | ICD-10-CM | POA: Diagnosis not present

## 2019-08-24 DIAGNOSIS — R1319 Other dysphagia: Secondary | ICD-10-CM | POA: Diagnosis not present

## 2019-08-24 DIAGNOSIS — F418 Other specified anxiety disorders: Secondary | ICD-10-CM | POA: Diagnosis not present

## 2019-08-24 DIAGNOSIS — E860 Dehydration: Secondary | ICD-10-CM | POA: Diagnosis present

## 2019-08-24 DIAGNOSIS — K219 Gastro-esophageal reflux disease without esophagitis: Secondary | ICD-10-CM | POA: Diagnosis present

## 2019-08-24 DIAGNOSIS — R638 Other symptoms and signs concerning food and fluid intake: Secondary | ICD-10-CM | POA: Diagnosis not present

## 2019-08-24 DIAGNOSIS — J1282 Pneumonia due to coronavirus disease 2019: Secondary | ICD-10-CM | POA: Diagnosis not present

## 2019-08-24 DIAGNOSIS — M50221 Other cervical disc displacement at C4-C5 level: Secondary | ICD-10-CM | POA: Diagnosis not present

## 2019-08-24 DIAGNOSIS — M6389 Disorders of muscle in diseases classified elsewhere, multiple sites: Secondary | ICD-10-CM | POA: Diagnosis not present

## 2019-08-24 DIAGNOSIS — Z743 Need for continuous supervision: Secondary | ICD-10-CM | POA: Diagnosis not present

## 2019-08-24 DIAGNOSIS — R279 Unspecified lack of coordination: Secondary | ICD-10-CM | POA: Diagnosis not present

## 2019-08-24 DIAGNOSIS — R0602 Shortness of breath: Secondary | ICD-10-CM | POA: Diagnosis not present

## 2019-08-24 DIAGNOSIS — R630 Anorexia: Secondary | ICD-10-CM | POA: Diagnosis not present

## 2019-08-24 DIAGNOSIS — F29 Unspecified psychosis not due to a substance or known physiological condition: Secondary | ICD-10-CM | POA: Diagnosis not present

## 2019-08-24 DIAGNOSIS — R278 Other lack of coordination: Secondary | ICD-10-CM | POA: Diagnosis not present

## 2019-08-24 DIAGNOSIS — Z66 Do not resuscitate: Secondary | ICD-10-CM | POA: Diagnosis not present

## 2019-08-24 DIAGNOSIS — I130 Hypertensive heart and chronic kidney disease with heart failure and stage 1 through stage 4 chronic kidney disease, or unspecified chronic kidney disease: Secondary | ICD-10-CM | POA: Diagnosis not present

## 2019-08-24 DIAGNOSIS — J9601 Acute respiratory failure with hypoxia: Secondary | ICD-10-CM | POA: Diagnosis not present

## 2019-08-24 DIAGNOSIS — G9341 Metabolic encephalopathy: Secondary | ICD-10-CM | POA: Diagnosis not present

## 2019-08-24 DIAGNOSIS — F319 Bipolar disorder, unspecified: Secondary | ICD-10-CM | POA: Diagnosis not present

## 2019-08-24 DIAGNOSIS — N1831 Chronic kidney disease, stage 3a: Secondary | ICD-10-CM | POA: Diagnosis not present

## 2019-08-24 DIAGNOSIS — R001 Bradycardia, unspecified: Secondary | ICD-10-CM | POA: Diagnosis not present

## 2019-08-24 DIAGNOSIS — N390 Urinary tract infection, site not specified: Secondary | ICD-10-CM | POA: Diagnosis not present

## 2019-08-24 DIAGNOSIS — E870 Hyperosmolality and hypernatremia: Secondary | ICD-10-CM | POA: Diagnosis not present

## 2019-08-24 DIAGNOSIS — R0689 Other abnormalities of breathing: Secondary | ICD-10-CM | POA: Diagnosis not present

## 2019-08-24 DIAGNOSIS — R488 Other symbolic dysfunctions: Secondary | ICD-10-CM | POA: Diagnosis not present

## 2019-08-24 DIAGNOSIS — N179 Acute kidney failure, unspecified: Secondary | ICD-10-CM | POA: Diagnosis not present

## 2019-08-24 DIAGNOSIS — M4312 Spondylolisthesis, cervical region: Secondary | ICD-10-CM | POA: Diagnosis not present

## 2019-08-24 DIAGNOSIS — E878 Other disorders of electrolyte and fluid balance, not elsewhere classified: Secondary | ICD-10-CM | POA: Diagnosis present

## 2019-08-24 DIAGNOSIS — A419 Sepsis, unspecified organism: Secondary | ICD-10-CM | POA: Diagnosis not present

## 2019-08-24 DIAGNOSIS — R404 Transient alteration of awareness: Secondary | ICD-10-CM | POA: Diagnosis not present

## 2019-08-24 DIAGNOSIS — R918 Other nonspecific abnormal finding of lung field: Secondary | ICD-10-CM | POA: Diagnosis not present

## 2019-08-24 DIAGNOSIS — G44309 Post-traumatic headache, unspecified, not intractable: Secondary | ICD-10-CM | POA: Diagnosis not present

## 2019-08-24 LAB — BASIC METABOLIC PANEL
Anion gap: 9 (ref 5–15)
BUN: 49 mg/dL — ABNORMAL HIGH (ref 8–23)
CO2: 20 mmol/L — ABNORMAL LOW (ref 22–32)
Calcium: 8.1 mg/dL — ABNORMAL LOW (ref 8.9–10.3)
Chloride: 114 mmol/L — ABNORMAL HIGH (ref 98–111)
Creatinine, Ser: 1.31 mg/dL — ABNORMAL HIGH (ref 0.44–1.00)
GFR calc Af Amer: 44 mL/min — ABNORMAL LOW (ref >60)
GFR calc non Af Amer: 38 mL/min — ABNORMAL LOW (ref >60)
Glucose, Bld: 193 mg/dL — ABNORMAL HIGH (ref 70–99)
Potassium: 4.8 mmol/L (ref 3.5–5.1)
Sodium: 143 mmol/L (ref 135–145)

## 2019-08-24 LAB — CBC WITH DIFFERENTIAL/PLATELET
Abs Immature Granulocytes: 2.57 10*3/uL — ABNORMAL HIGH (ref 0.00–0.07)
Basophils Absolute: 0 10*3/uL (ref 0.0–0.1)
Basophils Relative: 0 %
Eosinophils Absolute: 0 10*3/uL (ref 0.0–0.5)
Eosinophils Relative: 0 %
HCT: 33 % — ABNORMAL LOW (ref 36.0–46.0)
Hemoglobin: 10.7 g/dL — ABNORMAL LOW (ref 12.0–15.0)
Immature Granulocytes: 17 %
Lymphocytes Relative: 8 %
Lymphs Abs: 1.2 10*3/uL (ref 0.7–4.0)
MCH: 31.2 pg (ref 26.0–34.0)
MCHC: 32.4 g/dL (ref 30.0–36.0)
MCV: 96.2 fL (ref 80.0–100.0)
Monocytes Absolute: 0.7 10*3/uL (ref 0.1–1.0)
Monocytes Relative: 5 %
Neutro Abs: 10.4 10*3/uL — ABNORMAL HIGH (ref 1.7–7.7)
Neutrophils Relative %: 70 %
Platelets: 222 10*3/uL (ref 150–400)
RBC: 3.43 MIL/uL — ABNORMAL LOW (ref 3.87–5.11)
RDW: 13.3 % (ref 11.5–15.5)
WBC: 15 10*3/uL — ABNORMAL HIGH (ref 4.0–10.5)
nRBC: 0.1 % (ref 0.0–0.2)

## 2019-08-24 LAB — SAMPLE TO BLOOD BANK

## 2019-08-24 LAB — MAGNESIUM: Magnesium: 2.3 mg/dL (ref 1.7–2.4)

## 2019-08-24 MED ORDER — DEXAMETHASONE 6 MG PO TABS: 6.0000 mg | ORAL_TABLET | ORAL | 0 refills | Status: DC

## 2019-08-24 MED ORDER — AMLODIPINE BESYLATE 10 MG PO TABS: 10.0000 mg | ORAL_TABLET | Freq: Every day | ORAL | 0 refills | Status: DC

## 2019-08-24 MED ORDER — AMLODIPINE BESYLATE 5 MG PO TABS
5.0000 mg | ORAL_TABLET | Freq: Once | ORAL | Status: AC
  Administered 2019-08-24: 5 mg via ORAL
  Filled 2019-08-24: qty 1

## 2019-08-24 MED ORDER — AMLODIPINE BESYLATE 10 MG PO TABS: 10.0000 mg | ORAL_TABLET | Freq: Every day | ORAL | Status: DC

## 2019-08-24 NOTE — Progress Notes (Signed)
Called [facility] and gave report on patient to Nurse Jody.

## 2019-08-24 NOTE — Progress Notes (Signed)
Occupational Therapy Treatment Patient Details Name: Leah Waller MRN: [id number] DOB: February 27, 1935 Today's Date: 08/24/2019    History of present illness 85 y.o. female admitted on 08/18/19 for fall and weakness.  Found to have COVID 19 PNA.  CT of head and c-spine negative for fx, x-ray of L hip/pelvis also negative for fx. Other dx include acute metabolic encepholopathy superimposed on baseline Alzheimer's dementia, AKI, acute cystitis (positive e-coli), dehydration with hypernatremia, bradycardia.  Pt with other significant PMH of HTN, bipolar d/o, CKD III, chronic systolic and diastolic CHF.    OT comments  Patient supine in bed on arrival.  She was cognitively more responsive than previous visits, but still flat and unoriented.  She was agreeable to therapy. Required mod assist with bed mobility and transfers.  Patient able to pull self up on sink counter to stand with min assist and perform grooming.  She also ate peaches while seated with set up.   Patient remained on room air throughout session and SpO2> 96. Will continue to follow with OT acutely to address the deficits listed below.     Follow Up Recommendations  SNF;Supervision/Assistance - 24 hour    Equipment Recommendations       Recommendations for Other Services      Precautions / Restrictions Precautions Precautions: Fall Precaution Comments: reportedly increased falls at home Restrictions Weight Bearing Restrictions: No       Mobility Bed Mobility Overal bed mobility: Needs Assistance Bed Mobility: Supine to Sit     Supine to sit: Mod assist          Transfers Overall transfer level: Needs assistance   Transfers: Sit to/from Stand Sit to Stand: Min assist  Stand Pivot: Mod Assist         General transfer comment: ABle to use counter/sink to help pull up in front    Balance Overall balance assessment: Needs assistance Sitting-balance support: Feet supported;Bilateral upper extremity  supported Sitting balance-Leahy Scale: Fair     Standing balance support: Bilateral upper extremity supported Standing balance-Leahy Scale: Poor Standing balance comment: No walkerfor transfer, required bilat support from therapist                           ADL either performed or assessed with clinical judgement   ADL Overall ADL's : Needs assistance/impaired Eating/Feeding: Sitting;Set up   Grooming: Wash/dry hands;Oral care;Minimal assistance;Standing   Upper Body Bathing: Minimal assistance;Sitting                           Functional mobility during ADLs: Moderate assistance       Vision       Perception     Praxis      Cognition Arousal/Alertness: Awake/alert Behavior During Therapy: Flat affect Overall Cognitive Status: History of cognitive impairments - at baseline                                          Exercises Exercises: Other exercises Other Exercises Other Exercises: 5 functional sit to stands with grooming   Shoulder Instructions       General Comments      Pertinent Vitals/ Pain       Pain Assessment: No/denies pain  Home Living  Prior Functioning/Environment              Frequency  Min 3X/week        Progress Toward Goals  OT Goals(current goals can now be found in the care plan section)  Progress towards OT goals: Progressing toward goals  Acute Rehab OT Goals Patient Stated Goal: Unable to state OT Goal Formulation: With patient Time For Goal Achievement: 09/04/19 Potential to Achieve Goals: Good  Plan Discharge plan remains appropriate;Frequency needs to be updated    Co-evaluation                 AM-PAC OT "6 Clicks" Daily Activity     Outcome Measure   Help from another person eating meals?: A Little Help from another person taking care of personal grooming?: A Little Help from another person toileting,  which includes using toliet, bedpan, or urinal?: A Lot Help from another person bathing (including washing, rinsing, drying)?: A Lot Help from another person to put on and taking off regular upper body clothing?: A Little Help from another person to put on and taking off regular lower body clothing?: A Lot 6 Click Score: 15    End of Session    OT Visit Diagnosis: Unsteadiness on feet (R26.81);Muscle weakness (generalized) (M62.81);History of falling (Z91.81);Other symptoms and signs involving cognitive function   Activity Tolerance Patient limited by fatigue   Patient Left in chair;with call bell/phone within reach;with chair alarm set;with nursing/sitter in room   Nurse Communication Mobility status        Time: 1020-1058 OT Time Calculation (min): 38 min  Charges: OT General Charges $OT Visit: 1 Visit OT Treatments $Self Care/Home Management : 38-52 mins  August Luz, OTR/L    Phylliss Bob 08/24/2019, 11:43 AM

## 2019-08-24 NOTE — Progress Notes (Signed)
Physical Therapy Treatment Patient Details Name: Leah Waller MRN: [id number] DOB: 10-05-1934 Today's Date: 08/24/2019    History of Present Illness 85 y.o. female admitted on 08/18/19 for fall and weakness.  Found to have COVID 19 PNA.  CT of head and c-spine negative for fx, x-ray of L hip/pelvis also negative for fx. Other dx include acute metabolic encepholopathy superimposed on baseline Alzheimer's dementia, AKI, acute cystitis (positive e-coli), dehydration with hypernatremia, bradycardia.  Pt with other significant PMH of HTN, bipolar d/o, CKD III, chronic systolic and diastolic CHF.     PT Comments    She was resting in her BS chair when PT arrived this afternoon. States she is "cold", and blankets (warmed) were brought and applied. She agreed to exercise format again, but no practicing sit<>stand or ambulation. She was able to perform LE format with verbal and tactile cues. LE are very hypertonic- and tight. Should benefit from ongoing PT to further address optimal functional outcomes. She remains high risk for falls- reduced safety awareness, and little insight into her medical situation.   Follow Up Recommendations  SNF;Supervision/Assistance - 24 hour     Equipment Recommendations  Rolling walker with 5" wheels;3in1 (PT)    Recommendations for Other Services       Precautions / Restrictions Precautions Precautions: Fall Precaution Comments: reportedly increased falls at home Restrictions Weight Bearing Restrictions: No    Mobility  Bed Mobility                  Transfers                    Ambulation/Gait                 Stairs             Wheelchair Mobility    Modified Rankin (Stroke Patients Only)       Balance                                            Cognition Arousal/Alertness: Awake/alert Behavior During Therapy: Flat affect Overall Cognitive Status: History of cognitive impairments - at  baseline                                 General Comments: Agreed to perform ex this afternoon, but declined to attempt any transfers or ambulation. See flow sheet. She has very flat affect, slow to process, and frequently does not answer.      Exercises General Exercises - Lower Extremity Ankle Circles/Pumps: AROM;AAROM;Seated;Both;10 reps Quad Sets: AROM;Seated;AAROM;Both;10 reps Short Arc Quad: AROM;Seated;AAROM;Both;10 reps Hip ABduction/ADduction: AROM;AAROM;Seated;10 reps;Both Straight Leg Raises: AAROM;Seated;Both;15 reps Other Exercises Other Exercises: She is very stiff in LEs, and tending not to understand and remember to follow through with ex. When asked if she does move her legs, feet and ankles, she consistently says "NO". this occured am and pm sessions in PT    General Comments        Pertinent Vitals/Pain Pain Assessment: No/denies pain    Home Living                      Prior Function            PT Goals (current goals can now be found in the care  plan section) Acute Rehab PT Goals Patient Stated Goal: Unable to state PT Goal Formulation: Patient unable to participate in goal setting Time For Goal Achievement: 09/03/19 Potential to Achieve Goals: Fair Progress towards PT goals: Not progressing toward goals - comment(she fluctuates in participation)    Frequency    Min 3X/week      PT Plan Current plan remains appropriate    Co-evaluation              AM-PAC PT "6 Clicks" Mobility   Outcome Measure  Help needed turning from your back to your side while in a flat bed without using bedrails?: A Lot Help needed moving from lying on your back to sitting on the side of a flat bed without using bedrails?: A Lot Help needed moving to and from a bed to a chair (including a wheelchair)?: A Lot Help needed standing up from a chair using your arms (e.g., wheelchair or bedside chair)?: A Lot Help needed to walk in hospital  room?: A Lot Help needed climbing 3-5 steps with a railing? : A Lot 6 Click Score: 12    End of Session   Activity Tolerance: Patient limited by fatigue(tended to not speak- "Sundowners"?) Patient left: in chair;with call bell/phone within reach;with chair alarm set   PT Visit Diagnosis: Muscle weakness (generalized) (M62.81);Difficulty in walking, not elsewhere classified (R26.2);Repeated falls (R29.6)     Time: 0300-0321 PT Time Calculation (min) (ACUTE ONLY): 21 min  Charges:  $Therapeutic Exercise: 8-22 mins                 Rollen Sox, PT # [phone] CGV cell   Casandra Doffing 08/24/2019, 4:23 PM

## 2019-08-24 NOTE — Progress Notes (Signed)
Physical Therapy Treatment Patient Details Name: Leah Waller MRN: [id number] DOB: 1934/11/11 Today's Date: 08/24/2019    History of Present Illness 85 y.o. female admitted on 08/18/19 for fall and weakness.  Found to have COVID 19 PNA.  CT of head and c-spine negative for fx, x-ray of L hip/pelvis also negative for fx. Other dx include acute metabolic encepholopathy superimposed on baseline Alzheimer's dementia, AKI, acute cystitis (positive e-coli), dehydration with hypernatremia, bradycardia.  Pt with other significant PMH of HTN, bipolar d/o, CKD III, chronic systolic and diastolic CHF.     PT Comments    She was assisted to [facility] chair from Bed requiring mod A with cues and RW. Poor postural awareness, poor safety awareness. She agreed to ex format, but indicated she was "cold and needed more blankets". Performed LE ex in BS chair, with max cues, AA as detailed on Flow sheet for ex. At end of session, she remained in chair. Warm blankets provided, remote /call light in reach. Legs elevated and BS table over lap to prepare for lunch. Should benefit from ongoing PT for optimal functional outcomes. Scheduled for transition to SNF- possible DC today if bed available  Follow Up Recommendations  SNF;Supervision/Assistance - 24 hour     Equipment Recommendations  Rolling walker with 5" wheels;3in1 (PT)    Recommendations for Other Services       Precautions / Restrictions Precautions Precautions: Fall Precaution Comments: reportedly increased falls at home Restrictions Weight Bearing Restrictions: No    Mobility  Bed Mobility Overal bed mobility: Needs Assistance Bed Mobility: Supine to Sit     Supine to sit: Mod assist        Transfers Overall transfer level: Needs assistance Equipment used: Rolling walker (2 wheeled) Transfers: Sit to/from Stand Sit to Stand: Min assist;+2 physical assistance         General transfer comment: ABle to use counter/sink to help pull  up in front  Ambulation/Gait Ambulation/Gait assistance: Min assist;+2 physical assistance Gait Distance (Feet): 10 Feet Assistive device: Rolling walker (2 wheeled) Gait Pattern/deviations: Step-through pattern;Shuffle;Staggering left;Staggering right     General Gait Details: Even with RW pt with staggering gait pattern, hand over hand assist for placement of hands on RW handles, pt very unsteady even with support of the RW.     Stairs             Wheelchair Mobility    Modified Rankin (Stroke Patients Only)       Balance Overall balance assessment: Needs assistance Sitting-balance support: Feet supported;Bilateral upper extremity supported Sitting balance-Leahy Scale: Fair     Standing balance support: Bilateral upper extremity supported Standing balance-Leahy Scale: Poor Standing balance comment: P+,F- in standing, poor safety awareness and at risk for falls                            Cognition Arousal/Alertness: Awake/alert Behavior During Therapy: Flat affect Overall Cognitive Status: History of cognitive impairments - at baseline                                 General Comments: No one to determine if current affect and presentation is normal, but baseline Alzheimer's dementia.       Exercises General Exercises - Lower Extremity Ankle Circles/Pumps: AROM;AAROM;Seated;Both;10 reps Quad Sets: AROM;Seated;AAROM;Both;10 reps Short Arc Quad: AROM;Seated;AAROM;Both;10 reps Hip ABduction/ADduction: AROM;AAROM;Seated;10 reps;Both Straight Leg Raises: AAROM;Seated;Both;15 reps  Other Exercises Other Exercises: She is very stiff in LEs, and tending not to understand and remember to follow through with ex. When asked if she does move her legs, feet and ankles, she consistently says "NO".    General Comments General comments (skin integrity, edema, etc.): Monitor skin and joint integrity. Stable on SPO2 , RA- maintains 96-100%       Pertinent Vitals/Pain Pain Assessment: No/denies pain    Home Living                      Prior Function            PT Goals (current goals can now be found in the care plan section) Acute Rehab PT Goals Patient Stated Goal: Unable to state PT Goal Formulation: Patient unable to participate in goal setting Time For Goal Achievement: 09/03/19 Potential to Achieve Goals: Fair Progress towards PT goals: Not progressing toward goals - comment(due to congnitive issues, not consistent in follow through or presentation in PT.)    Frequency           PT Plan Current plan remains appropriate    Co-evaluation              AM-PAC PT "6 Clicks" Mobility   Outcome Measure  Help needed turning from your back to your side while in a flat bed without using bedrails?: A Lot Help needed moving from lying on your back to sitting on the side of a flat bed without using bedrails?: A Little Help needed moving to and from a bed to a chair (including a wheelchair)?: A Lot Help needed standing up from a chair using your arms (e.g., wheelchair or bedside chair)?: A Lot Help needed to walk in hospital room?: A Lot Help needed climbing 3-5 steps with a railing? : A Lot 6 Click Score: 13    End of Session Equipment Utilized During Treatment: Gait belt Activity Tolerance: Patient limited by fatigue Patient left: in chair;with call bell/phone within reach;with chair alarm set Nurse Communication: Mobility status PT Visit Diagnosis: Muscle weakness (generalized) (M62.81);Difficulty in walking, not elsewhere classified (R26.2);Repeated falls (R29.6)     Time: 1110-1144 PT Time Calculation (min) (ACUTE ONLY): 34 min  Charges:  $Therapeutic Exercise: 8-22 mins $Therapeutic Activity: 8-22 mins          Rollen Sox, PT # [phone] CGV cell         Casandra Doffing 08/24/2019, 12:04 PM

## 2019-08-24 NOTE — Discharge Summary (Signed)
Physician Discharge Summary  Glenora Warhurst [place] [id number] DOB: [birth year] DOA: 08/18/2019  PCP: Orvis [facility]  Admit date: 08/18/2019 Discharge date: 08/24/2019  Time spent: 30 minutes  Recommendations for Outpatient Follow-up:  Covid pneumonia COVID-19 Labs  Recent Labs    08/22/19 0313  DDIMER 0.50  FERRITIN 375*  CRP 1.2*    No results found for: SARSCOV2NAA   -Decadron 6 mg daily -Remdesivir (completed) -Vitamins per Covid protocol -Combivent  Acute metabolic encephalopathy -Likely due to AKI and covid-19.  -DC all sedating medication  Chronic systolic and diastolic CHF -[id number] echocardiogram consistent with above diagnosis see results below -Strict in and out -2.1 L -Daily weight Filed Weights   08/22/19 0630 08/23/19 0421 08/24/19 0356  Weight: 73.5 kg 73.5 kg 73.5 kg    Essential hypertension -Amlodipine 10 mg daily -Losartan 50 mg daily  Bradycardia -Possible cause of her frequent falls? -2/19 echocardiogram; consistent with systolic and diastolic CHF -Discussed case with Dr. Thompson Grayer, EP who feels that most likely secondary to patient's acute Covid illness will follow-up with a virtual visit with patient and a couple weeks post discharge.  Acute on CKD stage IIIa (baseline Cr 1.73) Recent Labs  Lab 08/18/19 0520 08/19/19 0224 08/20/19 0025 08/21/19 0133 08/22/19 0313  CREATININE 2.10*  2.08* 2.01* 1.58* 1.27* 1.29*  -Better than baseline.  Acute cystitis positive E. Coli -UA suggestive of acute cystitis. -Completed 5-day course antibiotics  Dehydration with hypernatremia -Resolved -See acute on CKD stage III.   Frequent falls -2/18 PT/OT consult A/O x1 (does not know where, when, why).  States lives alone.  Evaluate for SNF  -2/23 discharge [facility];   Alzheimer's dementia with behavioral disturbance ([place]) -Chronic Will need updated MAR from [facility].    Discharge Diagnoses:  Principal  Problem:   Pneumonia due to COVID-19 virus Active Problems:   Alzheimer's dementia with behavioral disturbance ([place])   Essential hypertension   CKD (chronic kidney disease) stage 3, GFR 30-59 ml/min   COVID-19 virus infection   Acute metabolic encephalopathy   Frequent falls   Acute renal failure superimposed on stage 3a chronic kidney disease (HCC)   Acute respiratory disease due to COVID-19 virus   Dehydration with hypernatremia   Acute cystitis   DNR (do not resuscitate)   Dementia without behavioral disturbance ([place])   Bipolar 1 disorder ([place])   Discharge Condition: Stable  Diet recommendation: Soft diet fluid consistency thin  Filed Weights   08/22/19 0630 08/23/19 0421 08/24/19 0356  Weight: 73.5 kg 73.5 kg 73.5 kg    History of present illness:  Irelan Stennett a 85 y.o.BF (from [place]) PMHx Dementia, Bipolar, Anxiety, HTN, iron deficiency anemia, CKD stage III  Presents withfall andweakness.  Patient hasdementia, andis unable to provide accurate medical history. I called her daughter who also dose not knowwhat happened to patient in detailsince that she is not living with patient, therefore, most of the history is obtained by discussing the case with ED physician, per EMS report, and with the nursing staff.  Per report, pt was found to havedecreased level of alertness,generalized weakness. Normally she is ambulatory and conversant, however at bedside notes that patient has beenmoreconfused and somnolent for the past 2 days. She isunable to stand or ambulate. Has decreased oral intake. Patient has had multiple falls over the last week or 2.They note that she appears to stop moving her left leg since yesterday. Patient indicates some throat discomfort but unable to describe any  other specific symptoms.Does not seem to have chest pain or abdominal pain. No active nausea vomiting, diarrhea noted. No active cough  noted.  ED Course:pt was found to have positive Covid 19 Ag test, WBC 7.0, worsening renal function, temperature normal, blood pressure 123/61, heart rate 63, RR 25, oxygen saturation 93-95% on RA,chest x-ray showed bilateral infiltration. CT of the head is negative for acute intracranial abnormalities. CT of C-spine is negative for bony fracture. X-ray of left hip/pelvis is negative for bony fracture. Patient is admitted to [place] bed as inpatient.   Hospital Course:  Treated for Covid pneumonia responded well to Covid protocol.  Also treated for chronic systolic and diastolic CHF and essential HTN has responded well to medication, medications will need to be titrated as patient becomes more active.  Stable for discharge   Procedures: 2/19 echocardiogram;Left Ventricle: EF =45 to 50%.-The left ventricle demonstrates global hypokinesis. -Grade I diastolic dysfunction (impaired relaxation).  -Pericardium: A small pericardial effusion is present. The pericardial effusion is circumferential. Presence of pericardial fat pad.     Consultations: Phone consult; Discussed case with Dr. Thompson Grayer, EP   VENTILATOR SETTINGS: Room air 2/23 SPO2; 99%  Cultures  2/16 blood right AC NGTD 2/16 blood LEFT hand NGTD 2/16 urine positive E. Coli 2/17 urine positive E. Coli   Antibiotics Anti-infectives (From admission, onward)   Start     Dose/Rate Stop   08/21/19 0800  remdesivir 100 mg in sodium chloride 0.9 % 100 mL IVPB     100 mg 200 mL/hr over 30 Minutes 08/21/19 0950   08/21/19 0600  cephALEXin (KEFLEX) capsule 250 mg     250 mg 08/22/19 2125   08/21/19 0000  cephALEXin (KEFLEX) 250 MG capsule     250 mg 08/23/19 2359   08/18/19 1000  remdesivir 100 mg in sodium chloride 0.9 % 100 mL IVPB  Status:  Discontinued     100 mg 200 mL/hr over 30 Minutes 08/20/19 1427   08/18/19 0600  cefTRIAXone (ROCEPHIN) 1 g in sodium chloride 0.9 % 100 mL IVPB  Status:  Discontinued     1  g 200 mL/hr over 30 Minutes 08/20/19 1822       Discharge Exam: Vitals:   08/24/19 0356 08/24/19 0715 08/24/19 1155 08/24/19 1156  BP:  (!) 165/88    Pulse:  66    Resp:  (!) 23    Temp:  98.6 F (37 C)    TempSrc:  Oral    SpO2:  99% 98% 98%  Weight: 73.5 kg     Height:        General: A/O x1, (does not know where, when, why) pleasantly confused, no acute respiratory distress Eyes: negative scleral hemorrhage, negative anisocoria, negative icterus ENT: Negative Runny nose, negative gingival bleeding, Neck:  Negative scars, masses, torticollis, lymphadenopathy, JVD Lungs: Clear to auscultation bilaterally without wheezes or crackles Cardiovascular: Regular rate and rhythm without murmur gallop or rub normal S1 and S2   Discharge Instructions   Allergies as of 08/24/2019      Reactions   Aspirin Other (See Comments)   Unspecified   Lisinopril Other (See Comments)      Medication List    TAKE these medications   acetaminophen 500 MG tablet Commonly known as: TYLENOL Take 500 mg by mouth every 4 (four) hours as needed for mild pain or fever.   Alumina-Magnesia-Simethicone 200-200-20 MG/5ML suspension Generic drug: alum & mag hydroxide-simeth Take 30 mLs by mouth every 6 (  six) hours as needed for indigestion or heartburn.   amLODipine 10 MG tablet Commonly known as: NORVASC Take 1 tablet (10 mg total) by mouth daily. Start taking on: August 25, 2019 What changed:   medication strength  how much to take   ascorbic acid 500 MG tablet Commonly known as: VITAMIN C Take 1 tablet (500 mg total) by mouth daily.   [employer] VITAMIN B12 1000 MCG tablet Generic drug: cyanocobalamin Take 1,000 mcg by mouth daily.   dexamethasone 6 MG tablet Commonly known as: DECADRON Take 1 tablet (6 mg total) by mouth daily.   donepezil 5 MG tablet Commonly known as: ARICEPT Take 5 mg by mouth at bedtime.   ferrous sulfate 325 (65 FE) MG tablet Take 325 mg by mouth daily with  breakfast.   guaiFENesin 100 MG/5ML liquid Commonly known as: ROBITUSSIN Take 200 mg by mouth every 6 (six) hours as needed for cough.   Ipratropium-Albuterol 20-100 MCG/ACT Aers respimat Commonly known as: COMBIVENT Inhale 1 puff into the lungs 4 (four) times daily.   loperamide 2 MG capsule Commonly known as: IMODIUM Take 2 mg by mouth as needed for diarrhea or loose stools.   LORazepam 0.5 MG tablet Commonly known as: ATIVAN Take 1 tablet (0.5 mg total) by mouth 2 (two) times daily as needed for anxiety.   losartan 50 MG tablet Commonly known as: COZAAR Take 50 mg by mouth daily.   magnesium hydroxide 400 MG/5ML suspension Commonly known as: MILK OF MAGNESIA Take 30 mLs by mouth at bedtime as needed for mild constipation.   mirtazapine 7.5 MG tablet Commonly known as: REMERON Take 1 tablet (7.5 mg total) by mouth at bedtime. For sleep   neomycin-bacitracin-polymyxin [phone] ointment Apply 1 application topically daily as needed (skin tears).   ondansetron 4 MG tablet Commonly known as: ZOFRAN Take 1 tablet (4 mg total) by mouth every 6 (six) hours as needed for nausea.   risperiDONE 0.5 MG tablet Commonly known as: RisperDAL Take 1-1.5 tablets (0.5-0.75 mg total) by mouth as directed. Take 1 tablet in the AM and 1.5 tablets PM What changed:   when to take this  additional instructions  Another medication with the same name was removed. Continue taking this medication, and follow the directions you see here.   zinc sulfate 220 (50 Zn) MG capsule Take 1 capsule (220 mg total) by mouth daily.      Allergies  Allergen Reactions  . Aspirin Other (See Comments)    Unspecified  . Lisinopril Other (See Comments)   Follow-up Information    [facility] Follow up.   Specialty: Geriatric Medicine Contact information: [address] [place] [address] [phone]            The results of significant diagnostics from  this hospitalization (including imaging, microbiology, ancillary and laboratory) are listed below for reference.    Significant Diagnostic Studies: CT HEAD WO CONTRAST  Result Date: 08/17/2019 CLINICAL DATA:  Altered mental status for 2 days. EXAM: CT HEAD WITHOUT CONTRAST CT CERVICAL SPINE WITHOUT CONTRAST TECHNIQUE: Multidetector CT imaging of the head and cervical spine was performed following the standard protocol without intravenous contrast. Multiplanar CT image reconstructions of the cervical spine were also generated. COMPARISON:  Cervical spine MRI 09/19/2008. Head CT scan 04/30/2019. FINDINGS: CT HEAD FINDINGS Brain: No evidence of acute infarction, hemorrhage, hydrocephalus, extra-axial collection or mass lesion/mass effect. Mild chronic microvascular ischemic change again seen. Vascular: Atherosclerosis. Skull: Intact.  No focal  lesion. Sinuses/Orbits: Status post cataract surgery on the right. The patient is also status post right maxillary antrostomy. Other: None. Exaggeration of the normal cervical lordosis. 0.3 cm anterolisthesis C4 on C5 due to facet arthropathy. CT CERVICAL SPINE FINDINGS Alignment: There is some exaggeration of the normal cervical lordosis. 0.3 cm anterolisthesis C4 on C5 due to facet arthropathy. Skull base and vertebrae: No acute fracture. No primary bone lesion or focal pathologic process. Soft tissues and spinal canal: No prevertebral fluid or swelling. No visible canal hematoma. Disc levels: There is some loss of disc space height at C4-5. Small disc bulges are seen at C5-6 and C6-7. Upper chest: Lung apices clear. Other: None. IMPRESSION: No acute abnormality head or cervical spine. Chronic microvascular change. Mild cervical spondylosis. Electronically Signed   By: Inge Rise M.D.   On: 08/17/2019 13:27   CT CERVICAL SPINE WO CONTRAST  Result Date: 08/17/2019 CLINICAL DATA:  Altered mental status for 2 days. EXAM: CT HEAD WITHOUT CONTRAST CT CERVICAL SPINE  WITHOUT CONTRAST TECHNIQUE: Multidetector CT imaging of the head and cervical spine was performed following the standard protocol without intravenous contrast. Multiplanar CT image reconstructions of the cervical spine were also generated. COMPARISON:  Cervical spine MRI 09/19/2008. Head CT scan 04/30/2019. FINDINGS: CT HEAD FINDINGS Brain: No evidence of acute infarction, hemorrhage, hydrocephalus, extra-axial collection or mass lesion/mass effect. Mild chronic microvascular ischemic change again seen. Vascular: Atherosclerosis. Skull: Intact.  No focal lesion. Sinuses/Orbits: Status post cataract surgery on the right. The patient is also status post right maxillary antrostomy. Other: None. Exaggeration of the normal cervical lordosis. 0.3 cm anterolisthesis C4 on C5 due to facet arthropathy. CT CERVICAL SPINE FINDINGS Alignment: There is some exaggeration of the normal cervical lordosis. 0.3 cm anterolisthesis C4 on C5 due to facet arthropathy. Skull base and vertebrae: No acute fracture. No primary bone lesion or focal pathologic process. Soft tissues and spinal canal: No prevertebral fluid or swelling. No visible canal hematoma. Disc levels: There is some loss of disc space height at C4-5. Small disc bulges are seen at C5-6 and C6-7. Upper chest: Lung apices clear. Other: None. IMPRESSION: No acute abnormality head or cervical spine. Chronic microvascular change. Mild cervical spondylosis. Electronically Signed   By: Inge Rise M.D.   On: 08/17/2019 13:27   DG Chest Portable 1 View  Result Date: 08/17/2019 CLINICAL DATA:  Altered mental status for 2 days. EXAM: PORTABLE CHEST 1 VIEW COMPARISON:  Single-view of the chest 07/08/2009. FINDINGS: Hazy airspace disease is seen bilaterally, most notable in the upper lung zones and left lung base. No pneumothorax or pleural effusion. Heart size is mildly enlarged. No acute or focal bony abnormality. IMPRESSION: Hazy bilateral airspace disease has an appearance  most worrisome for pneumonia. Electronically Signed   By: Inge Rise M.D.   On: 08/17/2019 13:16   ECHOCARDIOGRAM COMPLETE  Result Date: 08/20/2019    ECHOCARDIOGRAM REPORT   Patient Name:   RISA BALLI [facility] Date of Exam: 08/20/2019 Medical Rec #:  [id number]            Height:       64.0 in Accession #:    [id number]           Weight:       149.9 lb Date of Birth:  August 10, 1934             BSA:          1.73 m Patient Age:    47 years  BP:           157/58 mmHg Patient Gender: F                    HR:           50 bpm. Exam Location:  Inpatient Procedure: 2D Echo Indications:    Bradycardia  History:        Patient has no prior history of Echocardiogram examinations.                 Risk Factors:Hypertension. COVID-19 virus infection                 Chronic kidney disease.  Sonographer:    Vikki Ports Turrentine Referring Phys: [place]:[id number] [place] Comments: Suboptimal subcostal window. IMPRESSIONS  1. Left ventricular ejection fraction, by estimation, is 45 to 50%. The left ventricle has mildly decreased function. The left ventricle demonstrates global hypokinesis. Left ventricular diastolic parameters are consistent with Grade I diastolic dysfunction (impaired relaxation).  2. Right ventricular systolic function is normal. The right ventricular size is normal. Tricuspid regurgitation signal is inadequate for assessing PA pressure.  3. Left atrial size was mildly dilated.  4. The mitral valve is grossly normal. Mild mitral valve regurgitation. No evidence of mitral stenosis.  5. The aortic valve is tricuspid. Aortic valve regurgitation is mild. No aortic stenosis is present. Comparison(s): No prior Echocardiogram. FINDINGS  Left Ventricle: Left ventricular ejection fraction, by estimation, is 45 to 50%. The left ventricle has mildly decreased function. The left ventricle demonstrates global hypokinesis. The left ventricular internal cavity size was normal in size. There is  no left  ventricular hypertrophy. Left ventricular diastolic parameters are consistent with Grade I diastolic dysfunction (impaired relaxation). Normal left ventricular filling pressure. Right Ventricle: The right ventricular size is normal. No increase in right ventricular wall thickness. Right ventricular systolic function is normal. Tricuspid regurgitation signal is inadequate for assessing PA pressure. Left Atrium: Left atrial size was mildly dilated. Right Atrium: Right atrial size was normal in size. Pericardium: A small pericardial effusion is present. The pericardial effusion is circumferential. Presence of pericardial fat pad. Mitral Valve: The mitral valve is grossly normal. Mild mitral valve regurgitation. No evidence of mitral valve stenosis. Tricuspid Valve: The tricuspid valve is grossly normal. Tricuspid valve regurgitation is trivial. Aortic Valve: The aortic valve is tricuspid. Aortic valve regurgitation is mild. No aortic stenosis is present. Pulmonic Valve: The pulmonic valve was grossly normal. Pulmonic valve regurgitation is trivial. Aorta: The aortic root is normal in size and structure. Venous: The inferior vena cava was not well visualized. IAS/Shunts: No atrial level shunt detected by color flow Doppler.  LEFT VENTRICLE PLAX 2D LVIDd:         3.92 cm  Diastology LVIDs:         2.82 cm  LV e' lateral:   6.49 cm/s LV PW:         0.94 cm  LV E/e' lateral: 13.4 LV IVS:        0.87 cm  LV e' medial:    7.80 cm/s LVOT diam:     1.90 cm  LV E/e' medial:  11.1 LV SV:         62.66 ml LV SV Index:   20.75 LVOT Area:     2.84 cm  RIGHT VENTRICLE RV S prime:     12.00 cm/s TAPSE (M-mode): 2.7 cm LEFT ATRIUM  Index       RIGHT ATRIUM           Index LA diam:        3.80 cm 2.20 cm/m  RA Area:     17.80 cm LA Vol (A2C):   54.6 ml 31.55 ml/m RA Volume:   48.40 ml  27.96 ml/m LA Vol (A4C):   51.5 ml 29.76 ml/m LA Biplane Vol: 54.4 ml 31.43 ml/m  AORTIC VALVE LVOT Vmax:   73.90 cm/s LVOT Vmean:   53.400 cm/s LVOT VTI:    0.221 m  AORTA Ao Root diam: 2.80 cm MITRAL VALVE MV Area (PHT): 2.87 cm    SHUNTS MV Decel Time: 264 msec    Systemic VTI:  0.22 m MV E velocity: 86.80 cm/s  Systemic Diam: 1.90 cm MV A velocity: 87.80 cm/s MV E/A ratio:  0.99 Eleonore Chiquito MD Electronically signed by Eleonore Chiquito MD Signature Date/Time: 08/20/2019/10:52:05 AM    Final    DG Hip Unilat W or Wo Pelvis 2-3 Views Left  Result Date: 08/17/2019 CLINICAL DATA:  Altered mental status. The patient refuses to ambulate. No known injury. EXAM: DG HIP (WITH OR WITHOUT PELVIS) 2-3V LEFT COMPARISON:  None. FINDINGS: There is no evidence of hip fracture or dislocation. There is no evidence of arthropathy or other focal bone abnormality. IMPRESSION: Negative exam. Electronically Signed   By: Inge Rise M.D.   On: 08/17/2019 13:15    Microbiology: Recent Results (from the past 240 hour(s))  Culture, blood (Routine X 2) w Reflex to ID Panel     Status: None   Collection Time: 08/17/19  7:08 PM   Specimen: BLOOD  Result Value Ref Range Status   Specimen Description BLOOD RIGHT ANTECUBITAL  Final   Special Requests   Final    BOTTLES DRAWN AEROBIC AND ANAEROBIC Blood Culture adequate volume   Culture   Final    NO GROWTH 5 DAYS Performed at [facility], [address] [place], [place] [address]    Report Status 08/22/2019 FINAL  Final  Urine culture     Status: Abnormal   Collection Time: 08/17/19  7:09 PM   Specimen: Urine, Random  Result Value Ref Range Status   Specimen Description   Final    URINE, RANDOM Performed at [facility], [address] [place], [place] [address]    Special Requests   Final    NONE Performed at [facility], [address] [place], [place] [address]    Culture >=100,000 COLONIES/mL ESCHERICHIA COLI (A)  Final   Report Status 08/22/2019 FINAL  Final   Organism ID, Bacteria ESCHERICHIA COLI (A)  Final      Susceptibility   Escherichia  coli - MIC*    AMPICILLIN <=2 SENSITIVE Sensitive     CEFAZOLIN <=4 SENSITIVE Sensitive     CEFTRIAXONE <=0.25 SENSITIVE Sensitive     CIPROFLOXACIN <=0.25 SENSITIVE Sensitive     GENTAMICIN <=1 SENSITIVE Sensitive     IMIPENEM <=0.25 SENSITIVE Sensitive     NITROFURANTOIN 32 SENSITIVE Sensitive     TRIMETH/SULFA <=20 SENSITIVE Sensitive     AMPICILLIN/SULBACTAM <=2 SENSITIVE Sensitive     PIP/TAZO <=4 SENSITIVE Sensitive     * >=100,000 COLONIES/mL ESCHERICHIA COLI  Culture, blood (Routine X 2) w Reflex to ID Panel     Status: None   Collection Time: 08/17/19  7:09 PM   Specimen: BLOOD  Result Value Ref Range Status   Specimen Description  BLOOD BLOOD LEFT HAND  Final   Special Requests   Final    BOTTLES DRAWN AEROBIC AND ANAEROBIC Blood Culture adequate volume   Culture   Final    NO GROWTH 5 DAYS Performed at [facility], [address] [place], [place] [address]    Report Status 08/22/2019 FINAL  Final  Culture, Urine     Status: Abnormal   Collection Time: 08/18/19  9:12 AM   Specimen: Urine, Clean Catch  Result Value Ref Range Status   Specimen Description   Final    URINE, CLEAN CATCH Performed at [facility], [place] [address], [place], [place] [address]    Special Requests   Final    NONE Performed at [facility], [place] [address] [place], [place] [address]    Culture 50,000 COLONIES/mL ESCHERICHIA COLI (A)  Final   Report Status 08/21/2019 FINAL  Final   Organism ID, Bacteria ESCHERICHIA COLI (A)  Final      Susceptibility   Escherichia coli - MIC*    AMPICILLIN <=2 SENSITIVE Sensitive     CEFAZOLIN <=4 SENSITIVE Sensitive     CEFTRIAXONE <=0.25 SENSITIVE Sensitive     CIPROFLOXACIN <=0.25 SENSITIVE Sensitive     GENTAMICIN <=1 SENSITIVE Sensitive     IMIPENEM <=0.25 SENSITIVE Sensitive     NITROFURANTOIN <=16 SENSITIVE Sensitive     TRIMETH/SULFA <=20 SENSITIVE Sensitive     AMPICILLIN/SULBACTAM <=2 SENSITIVE  Sensitive     PIP/TAZO <=4 SENSITIVE Sensitive     * 50,000 COLONIES/mL ESCHERICHIA COLI     Labs: Basic Metabolic Panel: Recent Labs  Lab 08/18/19 0520 08/19/19 0224 08/20/19 0025 08/21/19 0133 08/22/19 0313  NA 142 147* 144 141 143  K 4.7 4.5 3.8 4.1 4.1  CL 114* 120* 114* 111 114*  CO2 17* 16* 19* 19* 20*  GLUCOSE 135* 157* 289* 235* 165*  BUN 59* 68* 64* 56* 51*  CREATININE 2.10*  2.08* 2.01* 1.58* 1.27* 1.29*  CALCIUM 8.0* 7.8* 7.6* 7.6* 7.6*  MG 2.6* 2.4 2.3 2.3 2.3  PHOS 4.7* 4.2 2.7 3.5 3.2   Liver Function Tests: Recent Labs  Lab 08/18/19 0520 08/19/19 0224 08/20/19 0025 08/21/19 0133 08/22/19 0313  AST 24 20 51* 36 26  ALT 17 14 48* 48* 40  ALKPHOS 80 71 72 68 64  BILITOT 0.4 0.1* 0.5 0.4 0.3  PROT 7.8 6.8 6.6 6.5 6.0*  ALBUMIN 2.8* 2.4* 2.4* 2.5* 2.4*   No results for input(s): LIPASE, AMYLASE in the last 168 hours. No results for input(s): AMMONIA in the last 168 hours. CBC: Recent Labs  Lab 08/19/19 0224 08/20/19 0025 08/21/19 0133 08/22/19 0313 08/24/19 1130  WBC 5.1 6.3 6.9 10.7* 15.0*  NEUTROABS 3.6 4.6 4.9 6.8 PENDING  HGB 10.1* 10.0* 10.0* 9.9* 10.7*  HCT 32.5* 31.6* 31.5* 30.9* 33.0*  MCV 98.5 96.6 96.0 96.0 96.2  PLT 197 201 227 214 222   Cardiac Enzymes: No results for input(s): CKTOTAL, CKMB, CKMBINDEX, TROPONINI in the last 168 hours. BNP: BNP (last 3 results) Recent Labs    08/17/19 1909  BNP 39.0    ProBNP (last 3 results) No results for input(s): PROBNP in the last 8760 hours.  CBG: No results for input(s): GLUCAP in the last 168 hours.     Signed:  Dia Crawford, MD [facility] [phone] pager

## 2019-08-24 NOTE — Care Management Important Message (Signed)
Important Message  Patient Details  Name: Tahtiana Barsness MRN: [id number] Date of Birth: [birth year]   Medicare Important Message Given:  Yes - Important Message mailed due to current National Emergency  Verbal consent obtained due to current National Emergency  Relationship to patient: Child Contact Name: Marinus Maw Call Date: 08/24/19  Time: [id number] Phone: [phone] Outcome: No Answer/Busy Important Message mailed to: Patient address on file    Delorse Lek 08/24/2019, 2:38 PM

## 2019-08-24 NOTE — TOC Progression Note (Signed)
Transition of Care [facility]) - Progression Note    Patient Details  Name: Leah Waller MRN: [id number] Date of Birth: June 27, 1935  Transition of Care [facility] CM/SW Contact  Joaquin Courts, RN Phone Number: 08/24/2019, 1:41 PM  Clinical Narrative:   CM confirmed patient has a bed at [facility]  today. [place] number [id number].  Patient will be transported by [facility].     Expected Discharge Plan: Skilled Nursing Facility Barriers to Discharge: Barriers Resolved  Expected Discharge Plan and Services Expected Discharge Plan: [place] Choice: [place] Living arrangements for the past 2 months: [place] Expected Discharge Date: 08/24/19                                     Social Determinants of Health (SDOH) Interventions    Readmission Risk Interventions No flowsheet data found.

## 2019-08-26 DIAGNOSIS — E870 Hyperosmolality and hypernatremia: Secondary | ICD-10-CM | POA: Diagnosis not present

## 2019-08-26 DIAGNOSIS — J1282 Pneumonia due to coronavirus disease 2019: Secondary | ICD-10-CM | POA: Diagnosis not present

## 2019-08-26 DIAGNOSIS — U071 COVID-19: Secondary | ICD-10-CM | POA: Diagnosis not present

## 2019-08-26 DIAGNOSIS — N179 Acute kidney failure, unspecified: Secondary | ICD-10-CM | POA: Diagnosis not present

## 2019-08-30 ENCOUNTER — Other Ambulatory Visit: Payer: Self-pay

## 2019-08-30 ENCOUNTER — Telehealth (INDEPENDENT_AMBULATORY_CARE_PROVIDER_SITE_OTHER): Payer: Medicare HMO | Admitting: Internal Medicine

## 2019-08-30 ENCOUNTER — Encounter: Payer: Self-pay | Admitting: Internal Medicine

## 2019-08-30 VITALS — BP 130/62 | HR 60 | Ht 63.0 in | Wt 151.3 lb

## 2019-08-30 DIAGNOSIS — I10 Essential (primary) hypertension: Secondary | ICD-10-CM

## 2019-08-30 DIAGNOSIS — I428 Other cardiomyopathies: Secondary | ICD-10-CM

## 2019-08-30 DIAGNOSIS — R001 Bradycardia, unspecified: Secondary | ICD-10-CM

## 2019-08-30 NOTE — Progress Notes (Signed)
Electrophysiology TeleHealth Note   Due to national recommendations of social distancing due to [place] 19, Audio/video telehealth visit is felt to be most appropriate for this patient at this time.  See MyChart message from today for patient consent regarding telehealth for [facility].   Date:  08/30/2019   ID:  Leah Waller, DOB 1934/07/29, MRN [id number]  Location: [facility] Provider location: [address] [place] Evaluation Performed: New patient consult  PCP:  [facility]  Cardiologist:  No primary care provider on file.   Electrophysiologist:  None   Chief Complaint:  bradycardia  History of Present Illness:    Leah Waller is a 85 y.o. female who presents via audio/video conferencing for a telehealth visit today.   The patient is referred for new consultation regarding bradycardia by Dr Clydene Laming. She was recently hospitalized with COVID (records reviewed).  During her hospitalization, she was noted to have asymptomatic nocturnal sinus bradycardia.  She has had falls in the past.  She has advanced dementia. She is unable to provide additional history.   Past Medical History:  Diagnosis Date  . Anxiety   . B12 deficiency   . Bipolar disorder ([place])   . Dementia ([place])   . Hypertension    PSH: unknown to caregiver   Current Outpatient Medications  Medication Sig Dispense Refill  . acetaminophen (TYLENOL) 500 MG tablet Take 500 mg by mouth every 4 (four) hours as needed for mild pain or fever.    Marland Kitchen alum & mag hydroxide-simeth (ALUMINA-MAGNESIA-SIMETHICONE) 200-200-20 MG/5ML suspension Take 30 mLs by mouth every 6 (six) hours as needed for indigestion or heartburn.    Marland Kitchen amLODipine (NORVASC) 10 MG tablet Take 1 tablet (10 mg total) by mouth daily. 30 tablet 0  . ascorbic acid (VITAMIN C) 500 MG tablet Take 1 tablet (500 mg total) by mouth daily. 30 tablet 0  . cyanocobalamin ([employer] VITAMIN B12) 1000 MCG tablet Take 1,000 mcg by  mouth daily.     Marland Kitchen dexamethasone (DECADRON) 6 MG tablet Take 1 tablet (6 mg total) by mouth daily. 4 tablet 0  . donepezil (ARICEPT) 5 MG tablet Take 5 mg by mouth at bedtime.    . ferrous sulfate 325 (65 FE) MG tablet Take 325 mg by mouth daily with breakfast.    . guaiFENesin (ROBITUSSIN) 100 MG/5ML liquid Take 200 mg by mouth every 6 (six) hours as needed for cough.    . Ipratropium-Albuterol (COMBIVENT) 20-100 MCG/ACT AERS respimat Inhale 1 puff into the lungs 4 (four) times daily. 4 g 0  . loperamide (IMODIUM) 2 MG capsule Take 2 mg by mouth as needed for diarrhea or loose stools.    Marland Kitchen LORazepam (ATIVAN) 0.5 MG tablet Take 1 tablet (0.5 mg total) by mouth 2 (two) times daily as needed for anxiety. 60 tablet 1  . losartan (COZAAR) 50 MG tablet Take 50 mg by mouth daily.    . magnesium hydroxide (MILK OF MAGNESIA) 400 MG/5ML suspension Take 30 mLs by mouth at bedtime as needed for mild constipation.    . mirtazapine (REMERON) 7.5 MG tablet Take 1 tablet (7.5 mg total) by mouth at bedtime. For sleep 90 tablet 0  . neomycin-bacitracin-polymyxin (NEOSPORIN) [phone] ointment Apply 1 application topically daily as needed (skin tears).    . ondansetron (ZOFRAN) 4 MG tablet Take 1 tablet (4 mg total) by mouth every 6 (six) hours as needed for nausea. 20 tablet 0  . risperiDONE (RISPERDAL) 0.5  MG tablet Take 1-1.5 tablets (0.5-0.75 mg total) by mouth as directed. Take 1 tablet in the AM and 1.5 tablets PM 225 tablet 0  . zinc sulfate 220 (50 Zn) MG capsule Take 1 capsule (220 mg total) by mouth daily. 30 capsule 0   No current facility-administered medications for this visit.    Allergies:   Aspirin and Lisinopril   Social History:  The patient  reports that she has quit smoking. She has never used smokeless tobacco. She reports previous alcohol use. She reports previous drug use.   Family History:  The patient's  Family history is unknown by patient.  caregiver  ROS:  Pt has dementia and  unable to provide, none per caregiver   Exam:    Vital Signs:  BP 130/62   Pulse 60   Ht 5\' 3"  (1.6 m)   Wt 151 lb 4.8 oz (68.6 kg)   BMI 26.80 kg/m  Alert but has dementia History per caregiver   Labs/Other Tests and Data Reviewed:    Recent Labs: 08/17/2019: B Natriuretic Peptide 39.0 08/22/2019: ALT 40 08/24/2019: BUN 49; Creatinine, Ser 1.31; Hemoglobin 10.7; Magnesium 2.3; Platelets 222; Potassium 4.8; Sodium 143   Wt Readings from Last 3 Encounters:  08/30/19 151 lb 4.8 oz (68.6 kg)  08/24/19 162 lb 0.6 oz (73.5 kg)  08/17/19 149 lb 14.6 oz (68 kg)     Other studies personally reviewed: Additional studies/ records that were reviewed today include: recent hospital records  Review of the above records today demonstrates: as above    ASSESSMENT & PLAN:    1.  Asymptomatic sinus bradycardia History per caregiver at [facility] (stephanie) Reports no bradycardia with heart rates mostly 60s. No indication for further EP evaluation  2. Nonischemic CM EF 40% in the setting of covid Given advanced age, no further workup is planned. 2 gram restriction  3. HTN Stable No change required today   Follow-up:  With PCP  Current medicines are reviewed at length with the patient today.   The patient does not have concerns regarding her medicines.  The following changes were made today:  none  Labs/ tests ordered today include:  No orders of the defined types were placed in this encounter.   Patient Risk:  after full review of this patients clinical status, I feel that they are at high risk at this time.   Today, I have spent 20 minutes with the patient with telehealth technology discussing bradycardia .    Signed, Thompson Grayer MD, [place] 08/30/2019 11:57 AM   [facility] [address] [place]  [address] [phone] (office) [phone] (fax)

## 2019-08-31 DIAGNOSIS — U071 COVID-19: Secondary | ICD-10-CM | POA: Diagnosis not present

## 2019-08-31 DIAGNOSIS — J1282 Pneumonia due to coronavirus disease 2019: Secondary | ICD-10-CM | POA: Diagnosis not present

## 2019-08-31 DIAGNOSIS — D649 Anemia, unspecified: Secondary | ICD-10-CM | POA: Diagnosis not present

## 2019-08-31 DIAGNOSIS — N179 Acute kidney failure, unspecified: Secondary | ICD-10-CM | POA: Diagnosis not present

## 2019-09-04 DIAGNOSIS — R69 Illness, unspecified: Secondary | ICD-10-CM | POA: Diagnosis not present

## 2019-09-06 ENCOUNTER — Emergency Department (HOSPITAL_COMMUNITY): Payer: Medicare HMO

## 2019-09-06 ENCOUNTER — Other Ambulatory Visit: Payer: Self-pay

## 2019-09-06 ENCOUNTER — Inpatient Hospital Stay (HOSPITAL_COMMUNITY)
Admission: EM | Admit: 2019-09-06 | Discharge: 2019-09-13 | DRG: 871 | Disposition: A | Discharge status: Skilled Nursing Facility | Payer: Medicare HMO | Source: Skilled Nursing Facility | Attending: Internal Medicine | Admitting: Internal Medicine

## 2019-09-06 ENCOUNTER — Encounter (HOSPITAL_COMMUNITY): Payer: Self-pay

## 2019-09-06 ENCOUNTER — Inpatient Hospital Stay (HOSPITAL_COMMUNITY): Payer: Medicare HMO

## 2019-09-06 DIAGNOSIS — I5042 Chronic combined systolic (congestive) and diastolic (congestive) heart failure: Secondary | ICD-10-CM | POA: Diagnosis present

## 2019-09-06 DIAGNOSIS — I2699 Other pulmonary embolism without acute cor pulmonale: Secondary | ICD-10-CM | POA: Diagnosis not present

## 2019-09-06 DIAGNOSIS — G309 Alzheimer's disease, unspecified: Secondary | ICD-10-CM | POA: Diagnosis present

## 2019-09-06 DIAGNOSIS — G44309 Post-traumatic headache, unspecified, not intractable: Secondary | ICD-10-CM | POA: Diagnosis not present

## 2019-09-06 DIAGNOSIS — R6520 Severe sepsis without septic shock: Secondary | ICD-10-CM

## 2019-09-06 DIAGNOSIS — F418 Other specified anxiety disorders: Secondary | ICD-10-CM

## 2019-09-06 DIAGNOSIS — F0280 Dementia in other diseases classified elsewhere without behavioral disturbance: Secondary | ICD-10-CM | POA: Diagnosis present

## 2019-09-06 DIAGNOSIS — F419 Anxiety disorder, unspecified: Secondary | ICD-10-CM | POA: Diagnosis present

## 2019-09-06 DIAGNOSIS — N179 Acute kidney failure, unspecified: Secondary | ICD-10-CM | POA: Diagnosis not present

## 2019-09-06 DIAGNOSIS — D631 Anemia in chronic kidney disease: Secondary | ICD-10-CM | POA: Diagnosis present

## 2019-09-06 DIAGNOSIS — R54 Age-related physical debility: Secondary | ICD-10-CM | POA: Diagnosis present

## 2019-09-06 DIAGNOSIS — D539 Nutritional anemia, unspecified: Secondary | ICD-10-CM | POA: Diagnosis present

## 2019-09-06 DIAGNOSIS — N183 Chronic kidney disease, stage 3 unspecified: Secondary | ICD-10-CM | POA: Diagnosis not present

## 2019-09-06 DIAGNOSIS — G9341 Metabolic encephalopathy: Secondary | ICD-10-CM | POA: Diagnosis not present

## 2019-09-06 DIAGNOSIS — R918 Other nonspecific abnormal finding of lung field: Secondary | ICD-10-CM | POA: Diagnosis not present

## 2019-09-06 DIAGNOSIS — Z888 Allergy status to other drugs, medicaments and biological substances status: Secondary | ICD-10-CM

## 2019-09-06 DIAGNOSIS — E785 Hyperlipidemia, unspecified: Secondary | ICD-10-CM | POA: Diagnosis present

## 2019-09-06 DIAGNOSIS — A419 Sepsis, unspecified organism: Principal | ICD-10-CM

## 2019-09-06 DIAGNOSIS — E878 Other disorders of electrolyte and fluid balance, not elsewhere classified: Secondary | ICD-10-CM

## 2019-09-06 DIAGNOSIS — Z87891 Personal history of nicotine dependence: Secondary | ICD-10-CM

## 2019-09-06 DIAGNOSIS — Z743 Need for continuous supervision: Secondary | ICD-10-CM | POA: Diagnosis not present

## 2019-09-06 DIAGNOSIS — J690 Pneumonitis due to inhalation of food and vomit: Secondary | ICD-10-CM | POA: Diagnosis not present

## 2019-09-06 DIAGNOSIS — Z66 Do not resuscitate: Secondary | ICD-10-CM | POA: Diagnosis not present

## 2019-09-06 DIAGNOSIS — I10 Essential (primary) hypertension: Secondary | ICD-10-CM | POA: Diagnosis present

## 2019-09-06 DIAGNOSIS — Z9181 History of falling: Secondary | ICD-10-CM | POA: Diagnosis not present

## 2019-09-06 DIAGNOSIS — J9601 Acute respiratory failure with hypoxia: Secondary | ICD-10-CM | POA: Diagnosis not present

## 2019-09-06 DIAGNOSIS — E870 Hyperosmolality and hypernatremia: Secondary | ICD-10-CM

## 2019-09-06 DIAGNOSIS — R404 Transient alteration of awareness: Secondary | ICD-10-CM | POA: Diagnosis not present

## 2019-09-06 DIAGNOSIS — K219 Gastro-esophageal reflux disease without esophagitis: Secondary | ICD-10-CM | POA: Diagnosis present

## 2019-09-06 DIAGNOSIS — R638 Other symptoms and signs concerning food and fluid intake: Secondary | ICD-10-CM | POA: Diagnosis not present

## 2019-09-06 DIAGNOSIS — R1312 Dysphagia, oropharyngeal phase: Secondary | ICD-10-CM | POA: Diagnosis not present

## 2019-09-06 DIAGNOSIS — R32 Unspecified urinary incontinence: Secondary | ICD-10-CM | POA: Diagnosis present

## 2019-09-06 DIAGNOSIS — N1831 Chronic kidney disease, stage 3a: Secondary | ICD-10-CM

## 2019-09-06 DIAGNOSIS — R0902 Hypoxemia: Secondary | ICD-10-CM | POA: Diagnosis not present

## 2019-09-06 DIAGNOSIS — R0689 Other abnormalities of breathing: Secondary | ICD-10-CM | POA: Diagnosis not present

## 2019-09-06 DIAGNOSIS — R4182 Altered mental status, unspecified: Secondary | ICD-10-CM

## 2019-09-06 DIAGNOSIS — R296 Repeated falls: Secondary | ICD-10-CM | POA: Diagnosis not present

## 2019-09-06 DIAGNOSIS — R262 Difficulty in walking, not elsewhere classified: Secondary | ICD-10-CM | POA: Diagnosis not present

## 2019-09-06 DIAGNOSIS — B948 Sequelae of other specified infectious and parasitic diseases: Secondary | ICD-10-CM | POA: Diagnosis not present

## 2019-09-06 DIAGNOSIS — J189 Pneumonia, unspecified organism: Secondary | ICD-10-CM | POA: Diagnosis not present

## 2019-09-06 DIAGNOSIS — M4312 Spondylolisthesis, cervical region: Secondary | ICD-10-CM | POA: Diagnosis not present

## 2019-09-06 DIAGNOSIS — I129 Hypertensive chronic kidney disease with stage 1 through stage 4 chronic kidney disease, or unspecified chronic kidney disease: Secondary | ICD-10-CM | POA: Diagnosis not present

## 2019-09-06 DIAGNOSIS — E875 Hyperkalemia: Secondary | ICD-10-CM | POA: Diagnosis present

## 2019-09-06 DIAGNOSIS — Z79899 Other long term (current) drug therapy: Secondary | ICD-10-CM

## 2019-09-06 DIAGNOSIS — M50221 Other cervical disc displacement at C4-C5 level: Secondary | ICD-10-CM | POA: Diagnosis not present

## 2019-09-06 DIAGNOSIS — M6281 Muscle weakness (generalized): Secondary | ICD-10-CM | POA: Diagnosis not present

## 2019-09-06 DIAGNOSIS — L89620 Pressure ulcer of left heel, unstageable: Secondary | ICD-10-CM | POA: Diagnosis not present

## 2019-09-06 DIAGNOSIS — R630 Anorexia: Secondary | ICD-10-CM | POA: Diagnosis not present

## 2019-09-06 DIAGNOSIS — M47812 Spondylosis without myelopathy or radiculopathy, cervical region: Secondary | ICD-10-CM | POA: Diagnosis not present

## 2019-09-06 DIAGNOSIS — R0600 Dyspnea, unspecified: Secondary | ICD-10-CM | POA: Diagnosis not present

## 2019-09-06 DIAGNOSIS — R0682 Tachypnea, not elsewhere classified: Secondary | ICD-10-CM | POA: Diagnosis not present

## 2019-09-06 DIAGNOSIS — I130 Hypertensive heart and chronic kidney disease with heart failure and stage 1 through stage 4 chronic kidney disease, or unspecified chronic kidney disease: Secondary | ICD-10-CM | POA: Diagnosis present

## 2019-09-06 DIAGNOSIS — R0602 Shortness of breath: Secondary | ICD-10-CM | POA: Diagnosis not present

## 2019-09-06 DIAGNOSIS — F29 Unspecified psychosis not due to a substance or known physiological condition: Secondary | ICD-10-CM | POA: Diagnosis not present

## 2019-09-06 DIAGNOSIS — Z886 Allergy status to analgesic agent status: Secondary | ICD-10-CM

## 2019-09-06 DIAGNOSIS — E860 Dehydration: Secondary | ICD-10-CM | POA: Diagnosis present

## 2019-09-06 DIAGNOSIS — R627 Adult failure to thrive: Secondary | ICD-10-CM | POA: Diagnosis present

## 2019-09-06 DIAGNOSIS — U071 COVID-19: Secondary | ICD-10-CM | POA: Diagnosis not present

## 2019-09-06 DIAGNOSIS — M6389 Disorders of muscle in diseases classified elsewhere, multiple sites: Secondary | ICD-10-CM | POA: Diagnosis not present

## 2019-09-06 DIAGNOSIS — R1313 Dysphagia, pharyngeal phase: Secondary | ICD-10-CM | POA: Diagnosis present

## 2019-09-06 DIAGNOSIS — Z6826 Body mass index (BMI) 26.0-26.9, adult: Secondary | ICD-10-CM

## 2019-09-06 DIAGNOSIS — R279 Unspecified lack of coordination: Secondary | ICD-10-CM | POA: Diagnosis not present

## 2019-09-06 DIAGNOSIS — N281 Cyst of kidney, acquired: Secondary | ICD-10-CM | POA: Diagnosis present

## 2019-09-06 LAB — PHOSPHORUS: Phosphorus: 5.8 mg/dL — ABNORMAL HIGH (ref 2.5–4.6)

## 2019-09-06 LAB — COMPREHENSIVE METABOLIC PANEL
ALT: 50 U/L — ABNORMAL HIGH (ref 0–44)
AST: 54 U/L — ABNORMAL HIGH (ref 15–41)
Albumin: 2 g/dL — ABNORMAL LOW (ref 3.5–5.0)
Alkaline Phosphatase: 110 U/L (ref 38–126)
BUN: 97 mg/dL — ABNORMAL HIGH (ref 8–23)
CO2: 17 mmol/L — ABNORMAL LOW (ref 22–32)
Calcium: 8.3 mg/dL — ABNORMAL LOW (ref 8.9–10.3)
Chloride: >130 mmol/L (ref 98–111)
Creatinine, Ser: 4.38 mg/dL — ABNORMAL HIGH (ref 0.44–1.00)
GFR calc Af Amer: 10 mL/min — ABNORMAL LOW (ref >60)
GFR calc non Af Amer: 9 mL/min — ABNORMAL LOW (ref >60)
Glucose, Bld: 143 mg/dL — ABNORMAL HIGH (ref 70–99)
Potassium: 5.9 mmol/L — ABNORMAL HIGH (ref 3.5–5.1)
Sodium: 162 mmol/L (ref 135–145)
Total Bilirubin: 1.8 mg/dL — ABNORMAL HIGH (ref 0.3–1.2)
Total Protein: 7.4 g/dL (ref 6.5–8.1)

## 2019-09-06 LAB — CBC
HCT: 30.1 % — ABNORMAL LOW (ref 36.0–46.0)
Hemoglobin: 8.6 g/dL — ABNORMAL LOW (ref 12.0–15.0)
MCH: 30.6 pg (ref 26.0–34.0)
MCHC: 28.6 g/dL — ABNORMAL LOW (ref 30.0–36.0)
MCV: 107.1 fL — ABNORMAL HIGH (ref 80.0–100.0)
Platelets: 178 10*3/uL (ref 150–400)
RBC: 2.81 MIL/uL — ABNORMAL LOW (ref 3.87–5.11)
RDW: 15 % (ref 11.5–15.5)
WBC: 7.4 10*3/uL (ref 4.0–10.5)
nRBC: 0 % (ref 0.0–0.2)

## 2019-09-06 LAB — URINALYSIS, ROUTINE W REFLEX MICROSCOPIC
Bilirubin Urine: NEGATIVE
Glucose, UA: NEGATIVE mg/dL
Ketones, ur: NEGATIVE mg/dL
Nitrite: NEGATIVE
Protein, ur: 30 mg/dL — AB
Specific Gravity, Urine: 1.019 (ref 1.005–1.030)
pH: 5 (ref 5.0–8.0)

## 2019-09-06 LAB — POCT I-STAT 7, (LYTES, BLD GAS, ICA,H+H)
Acid-base deficit: 7 mmol/L — ABNORMAL HIGH (ref 0.0–2.0)
Bicarbonate: 17 mmol/L — ABNORMAL LOW (ref 20.0–28.0)
Calcium, Ion: 1.19 mmol/L (ref 1.15–1.40)
HCT: 27 % — ABNORMAL LOW (ref 36.0–46.0)
Hemoglobin: 9.2 g/dL — ABNORMAL LOW (ref 12.0–15.0)
O2 Saturation: 98 %
Patient temperature: 101.5
Potassium: 4.6 mmol/L (ref 3.5–5.1)
Sodium: 159 mmol/L — ABNORMAL HIGH (ref 135–145)
TCO2: 18 mmol/L — ABNORMAL LOW (ref 22–32)
pCO2 arterial: 30.6 mmHg — ABNORMAL LOW (ref 32.0–48.0)
pH, Arterial: 7.361 (ref 7.350–7.450)
pO2, Arterial: 111 mmHg — ABNORMAL HIGH (ref 83.0–108.0)

## 2019-09-06 LAB — CBC WITH DIFFERENTIAL/PLATELET
Abs Immature Granulocytes: 0.07 10*3/uL (ref 0.00–0.07)
Basophils Absolute: 0 10*3/uL (ref 0.0–0.1)
Basophils Relative: 0 %
Eosinophils Absolute: 0 10*3/uL (ref 0.0–0.5)
Eosinophils Relative: 0 %
HCT: 34 % — ABNORMAL LOW (ref 36.0–46.0)
Hemoglobin: 9.8 g/dL — ABNORMAL LOW (ref 12.0–15.0)
Immature Granulocytes: 1 %
Lymphocytes Relative: 10 %
Lymphs Abs: 0.8 10*3/uL (ref 0.7–4.0)
MCH: 30.7 pg (ref 26.0–34.0)
MCHC: 28.8 g/dL — ABNORMAL LOW (ref 30.0–36.0)
MCV: 106.6 fL — ABNORMAL HIGH (ref 80.0–100.0)
Monocytes Absolute: 0.6 10*3/uL (ref 0.1–1.0)
Monocytes Relative: 8 %
Neutro Abs: 6.1 10*3/uL (ref 1.7–7.7)
Neutrophils Relative %: 81 %
Platelets: 232 10*3/uL (ref 150–400)
RBC: 3.19 MIL/uL — ABNORMAL LOW (ref 3.87–5.11)
RDW: 15.2 % (ref 11.5–15.5)
WBC: 7.6 10*3/uL (ref 4.0–10.5)
nRBC: 0 % (ref 0.0–0.2)

## 2019-09-06 LAB — MAGNESIUM: Magnesium: 2.9 mg/dL — ABNORMAL HIGH (ref 1.7–2.4)

## 2019-09-06 LAB — PROTIME-INR
INR: 1.4 — ABNORMAL HIGH (ref 0.8–1.2)
Prothrombin Time: 16.7 seconds — ABNORMAL HIGH (ref 11.4–15.2)

## 2019-09-06 LAB — APTT: aPTT: 26 seconds (ref 24–36)

## 2019-09-06 LAB — LACTIC ACID, PLASMA: Lactic Acid, Venous: 1.4 mmol/L (ref 0.5–1.9)

## 2019-09-06 LAB — CREATININE, SERUM
Creatinine, Ser: 3.68 mg/dL — ABNORMAL HIGH (ref 0.44–1.00)
GFR calc Af Amer: 12 mL/min — ABNORMAL LOW (ref >60)
GFR calc non Af Amer: 11 mL/min — ABNORMAL LOW (ref >60)

## 2019-09-06 LAB — PROCALCITONIN: Procalcitonin: 1.18 ng/mL

## 2019-09-06 MED ORDER — DEXTROSE-NACL 5-0.45 % IV SOLN: INTRAVENOUS | Status: DC

## 2019-09-06 MED ORDER — METRONIDAZOLE IN NACL 5-0.79 MG/ML-% IV SOLN
500.0000 mg | Freq: Once | INTRAVENOUS | Status: AC
  Administered 2019-09-06: 500 mg via INTRAVENOUS
  Filled 2019-09-06: qty 100

## 2019-09-06 MED ORDER — ACETAMINOPHEN 650 MG RE SUPP
650.0000 mg | Freq: Once | RECTAL | Status: AC
  Administered 2019-09-06: 650 mg via RECTAL
  Filled 2019-09-06: qty 1

## 2019-09-06 MED ORDER — TECHNETIUM TO 99M ALBUMIN AGGREGATED
1.5600 | Freq: Once | INTRAVENOUS | Status: AC | PRN
  Administered 2019-09-06: 1.56 via INTRAVENOUS

## 2019-09-06 MED ORDER — ENOXAPARIN SODIUM 30 MG/0.3ML ~~LOC~~ SOLN: 30.0000 mg | SUBCUTANEOUS | Status: DC

## 2019-09-06 MED ORDER — SODIUM CHLORIDE 0.45 % IV SOLN: INTRAVENOUS | Status: DC

## 2019-09-06 MED ORDER — LACTATED RINGERS IV BOLUS (SEPSIS)
1000.0000 mL | Freq: Once | INTRAVENOUS | Status: AC
  Administered 2019-09-06: 13:00:00 1000 mL via INTRAVENOUS

## 2019-09-06 MED ORDER — ONDANSETRON HCL 4 MG PO TABS: 4.0000 mg | ORAL_TABLET | Freq: Four times a day (QID) | ORAL | Status: DC | PRN

## 2019-09-06 MED ORDER — VANCOMYCIN HCL 1500 MG/300ML IV SOLN: 1500.0000 mg | Freq: Once | INTRAVENOUS | Status: DC

## 2019-09-06 MED ORDER — VANCOMYCIN VARIABLE DOSE PER UNSTABLE RENAL FUNCTION (PHARMACIST DOSING): Status: DC

## 2019-09-06 MED ORDER — ACETAMINOPHEN 325 MG PO TABS: 650.0000 mg | ORAL_TABLET | Freq: Four times a day (QID) | ORAL | Status: DC | PRN

## 2019-09-06 MED ORDER — SODIUM CHLORIDE 0.9 % IV SOLN: 2.0000 g | Freq: Once | INTRAVENOUS | Status: DC

## 2019-09-06 MED ORDER — LACTATED RINGERS IV BOLUS (SEPSIS)
1000.0000 mL | Freq: Once | INTRAVENOUS | Status: AC
  Administered 2019-09-06: 1000 mL via INTRAVENOUS

## 2019-09-06 MED ORDER — SODIUM CHLORIDE 0.9 % IV SOLN
2.0000 g | Freq: Once | INTRAVENOUS | Status: AC
  Administered 2019-09-06: 2 g via INTRAVENOUS
  Filled 2019-09-06: qty 2

## 2019-09-06 MED ORDER — SODIUM CHLORIDE 0.9 % IV SOLN
2.0000 g | INTRAVENOUS | Status: DC
  Administered 2019-09-07 – 2019-09-09 (×3): 2 g via INTRAVENOUS
  Filled 2019-09-06 (×5): qty 2

## 2019-09-06 MED ORDER — VANCOMYCIN HCL 1500 MG/300ML IV SOLN
1500.0000 mg | Freq: Once | INTRAVENOUS | Status: AC
  Administered 2019-09-06: 1500 mg via INTRAVENOUS
  Filled 2019-09-06: qty 300

## 2019-09-06 MED ORDER — IPRATROPIUM-ALBUTEROL 0.5-2.5 (3) MG/3ML IN SOLN
3.0000 mL | Freq: Four times a day (QID) | RESPIRATORY_TRACT | Status: DC
  Administered 2019-09-06 – 2019-09-07 (×4): 3 mL via RESPIRATORY_TRACT
  Filled 2019-09-06 (×4): qty 3

## 2019-09-06 MED ORDER — ONDANSETRON HCL 4 MG/2ML IJ SOLN: 4.0000 mg | Freq: Four times a day (QID) | INTRAMUSCULAR | Status: DC | PRN

## 2019-09-06 MED ORDER — HEPARIN (PORCINE) 25000 UT/250ML-% IV SOLN
1100.0000 [IU]/h | INTRAVENOUS | Status: AC
  Administered 2019-09-06: 1000 [IU]/h via INTRAVENOUS
  Administered 2019-09-07 – 2019-09-10 (×3): 1100 [IU]/h via INTRAVENOUS
  Filled 2019-09-06 (×7): qty 250

## 2019-09-06 MED ORDER — HYDRALAZINE HCL 20 MG/ML IJ SOLN: 10.0000 mg | Freq: Four times a day (QID) | INTRAMUSCULAR | Status: DC | PRN

## 2019-09-06 MED ORDER — VANCOMYCIN HCL IN DEXTROSE 1-5 GM/200ML-% IV SOLN: 1000.0000 mg | Freq: Once | INTRAVENOUS | Status: DC

## 2019-09-06 MED ORDER — ACETAMINOPHEN 650 MG RE SUPP: 650.0000 mg | Freq: Four times a day (QID) | RECTAL | Status: DC | PRN

## 2019-09-06 MED ORDER — HEPARIN BOLUS VIA INFUSION
4000.0000 [IU] | Freq: Once | INTRAVENOUS | Status: AC
  Administered 2019-09-06: 21:00:00 4000 [IU] via INTRAVENOUS
  Filled 2019-09-06: qty 4000

## 2019-09-06 NOTE — Progress Notes (Signed)
Notified bedside nurse of need to administer antibiotics.  

## 2019-09-06 NOTE — ED Provider Notes (Addendum)
[facility] EMERGENCY DEPARTMENT Provider Note   CSN: [id number] Arrival date & time: 09/06/19  1156   History Chief Complaint  Patient presents with  . Altered Mental Status  . Shortness of Breath    Leah Waller is a 85 y.o. female with past medical history significant for dementia, anxiety, bipolar, recent Covid diagnosis discharged 2 weeks ago presents for evaluation of altered mental status and new onset hypoxia.  Patient comes in from nursing facility.  Patient altered unable to provide information as well as nonverbal at baseline per facility.  Facility patient has had little to no intake over the last 3 days.  Has been coughing and has had progressive shortness of breath.  Patient was noted to be hypoxic to the mid 80s with EMS arrival.  Placed on 6 L of oxygen.  Per facility patient nonverbal however normally is able to shake head yes or no.  Unsure of recent falls.  Unsure of recent antibiotic use.  Does not use oxygen at facility.  Level 5 Caveat- AMS, Dementia  Attempted to contact daughter Marinus Maw at [phone] however phone rings once and then states unavilable.   1239: Contacted daughters medical POA, [name].  States patient has had difficulty breathing and has been altered x3 days.  She is unsure if recent falls at facility however states she does fall frequently.    She confirms patient is a DO NOT RESUSCITATE however she would like intubation, IV antibiotics or fluids if need be.   COVID dx 3 weeks ago at [facility]  HPI     Past Medical History:  Diagnosis Date  . Anxiety   . B12 deficiency   . Bipolar disorder ([place])   . Dementia ([place])   . Hypertension   . RBBB     Patient Active Problem List   Diagnosis Date Noted  . Dementia without behavioral disturbance ([place]) 08/18/2019  . Bipolar 1 disorder ([place]) 08/18/2019  . COVID-19 virus infection 08/17/2019  . Acute metabolic encephalopathy 37/85/8850  . Frequent falls  08/17/2019  . Depression with anxiety 08/17/2019  . Iron deficiency anemia 08/17/2019  . Acute renal failure superimposed on stage 3a chronic kidney disease ([place]) 08/17/2019  . Acute respiratory disease due to COVID-19 virus 08/17/2019  . Pneumonia due to COVID-19 virus 08/17/2019  . Dehydration with hypernatremia 08/17/2019  . Acute cystitis 08/17/2019  . DNR (do not resuscitate) 08/17/2019  . Memory deficit 04/14/2018  . Essential hypertension 04/14/2018  . Hyperlipidemia 04/14/2018  . CKD (chronic kidney disease) stage 3, GFR 30-59 ml/min 04/14/2018  . Anxiety 04/14/2018  . Alzheimer's dementia with behavioral disturbance ([place]) 12/22/2017  . Low vitamin B12 level 10/11/2014    History reviewed. No pertinent surgical history.   OB History   No obstetric history on file.     Family History  Family history unknown: Yes    Social History   Tobacco Use  . Smoking status: Former Research scientist (life sciences)  . Smokeless tobacco: Never Used  Substance Use Topics  . Alcohol use: Not Currently  . Drug use: Not Currently    Home Medications Prior to Admission medications   Medication Sig Start Date End Date Taking? Authorizing Provider  acetaminophen (TYLENOL) 500 MG tablet Take 500 mg by mouth every 4 (four) hours as needed for mild pain or fever.   Yes [provider]  alum & mag hydroxide-simeth (ALUMINA-MAGNESIA-SIMETHICONE) 200-200-20 MG/5ML suspension Take 30 mLs by mouth every 6 (six) hours as needed for indigestion  or heartburn.   Yes [provider]  amLODipine (NORVASC) 10 MG tablet Take 1 tablet (10 mg total) by mouth daily. 08/25/19  Yes Allie Bossier, MD  ascorbic acid (VITAMIN C) 500 MG tablet Take 1 tablet (500 mg total) by mouth daily. 08/21/19  Yes Allie Bossier, MD  cyanocobalamin ([facility]S VITAMIN B12) 1000 MCG tablet Take 1,000 mcg by mouth daily.  03/17/18  Yes [provider]  donepezil (ARICEPT) 5 MG tablet Take 5 mg by mouth at bedtime.   Yes [provider]  ferrous sulfate 325 (65 FE) MG tablet Take 325 mg by mouth daily with breakfast.   Yes [provider]  guaiFENesin (ROBITUSSIN) 100 MG/5ML liquid Take 200 mg by mouth every 6 (six) hours as needed for cough.   Yes [provider]  Ipratropium-Albuterol (COMBIVENT) 20-100 MCG/ACT AERS respimat Inhale 1 puff into the lungs 4 (four) times daily. 08/21/19  Yes Allie Bossier, MD  loperamide (IMODIUM) 2 MG capsule Take 2 mg by mouth as needed for diarrhea or loose stools.   Yes [provider]  LORazepam (ATIVAN) 0.5 MG tablet Take 1 tablet (0.5 mg total) by mouth 2 (two) times daily as needed for anxiety. 10/12/18  Yes Ursula Alert, MD  losartan (COZAAR) 50 MG tablet Take 50 mg by mouth daily.   Yes [provider]  magnesium hydroxide (MILK OF MAGNESIA) 400 MG/5ML suspension Take 30 mLs by mouth at bedtime as needed for mild constipation.   Yes [provider]  mirtazapine (REMERON) 7.5 MG tablet Take 1 tablet (7.5 mg total) by mouth at bedtime. For sleep 10/12/18  Yes Ursula Alert, MD  neomycin-bacitracin-polymyxin (NEOSPORIN) [phone] ointment Apply 1 application topically daily as needed (skin tears).   Yes [provider]  ondansetron (ZOFRAN) 4 MG tablet Take 1 tablet (4 mg total) by mouth every 6 (six) hours as needed for nausea. 08/21/19  Yes Allie Bossier, MD  risperiDONE (RISPERDAL) 0.5 MG tablet Take 1-1.5 tablets (0.5-0.75 mg total) by mouth as directed. Take 1 tablet in the AM and 1.5 tablets PM Patient taking differently: Take 0.25-0.5 mg by mouth as directed. Take 1/2 tablet in the AM and 1 tablet PM 10/12/18 08/17/20 Yes Eappen, Ria Clock, MD  zinc sulfate 220 (50 Zn) MG capsule Take 1 capsule (220 mg total) by mouth daily. 08/21/19  Yes Allie Bossier, MD  dexamethasone (DECADRON) 6 MG tablet Take 1 tablet (6 mg total) by mouth daily. Patient not taking: Reported on 09/06/2019 08/24/19   Allie Bossier, MD    Allergies    Aspirin and Lisinopril  Review of Systems   Review of Systems  Unable to perform ROS: Mental status change  All other systems reviewed and are negative.  Physical Exam Updated Vital Signs BP (!) 154/46   Pulse 81   Temp (!) 101.5 F (38.6 C) (Rectal)   Resp (!) 31   SpO2 100%   Physical Exam Vitals and nursing note reviewed.  Constitutional:      Appearance: She is ill-appearing and toxic-appearing.     Comments: Thin appearing  HENT:     Head: Normocephalic and atraumatic.     Mouth/Throat:     Comments: Dry mucous membranes Eyes:     Pupils: Pupils are equal, round, and reactive to light.  Cardiovascular:     Rate and Rhythm: Tachycardia present.  Pulmonary:     Effort: No respiratory distress.     Comments: Tachypnic, coarse  rhonchi throughout.  3 L of oxygen via nasal cannula Abdominal:     General: There is no distension.     Comments: Soft, nontender.  Old ecchymosis to lower abdominal wall, appeared to be from prior injections  Musculoskeletal:        General: Normal range of motion.     Cervical back: Normal range of motion.     Comments: Calves soft.  No lower extremity edema, erythema or warmth  Skin:    General: Skin is warm and dry.     Comments: Cap refill 2-3 seconds.  Stage 1 pressure ulcer to sacral region however no surrounding edema, erythema or warmth.  No bleeding or drainage.    ED Results / Procedures / Treatments   Labs (all labs ordered are listed, but only abnormal results are displayed) Labs Reviewed  COMPREHENSIVE METABOLIC PANEL - Abnormal; Notable for the following components:      Result Value   Sodium 162 (*)    Potassium 5.9 (*)    Chloride >130 (*)    CO2 17 (*)    Glucose, Bld 143 (*)    BUN 97 (*)    Creatinine, Ser 4.38 (*)    Calcium 8.3 (*)    Albumin 2.0 (*)    AST 54 (*)    ALT 50 (*)    Total Bilirubin 1.8 (*)    GFR calc non Af Amer 9 (*)    GFR calc Af Amer 10 (*)    All other components  within normal limits  CBC WITH DIFFERENTIAL/PLATELET - Abnormal; Notable for the following components:   RBC 3.19 (*)    Hemoglobin 9.8 (*)    HCT 34.0 (*)    MCV 106.6 (*)    MCHC 28.8 (*)    All other components within normal limits  PROTIME-INR - Abnormal; Notable for the following components:   Prothrombin Time 16.7 (*)    INR 1.4 (*)    All other components within normal limits  POCT I-STAT 7, (LYTES, BLD GAS, ICA,H+H) - Abnormal; Notable for the following components:   pCO2 arterial 30.6 (*)    pO2, Arterial 111.0 (*)    Bicarbonate 17.0 (*)    TCO2 18 (*)    Acid-base deficit 7.0 (*)    Sodium 159 (*)    HCT 27.0 (*)    Hemoglobin 9.2 (*)    All other components within normal limits  CULTURE, BLOOD (ROUTINE X 2)  CULTURE, BLOOD (ROUTINE X 2)  URINE CULTURE  LACTIC ACID, PLASMA  APTT  URINALYSIS, ROUTINE W REFLEX MICROSCOPIC  BLOOD GAS, ARTERIAL    EKG None  Radiology CT Head Wo Contrast  Result Date: 09/06/2019 CLINICAL DATA:  Posttraumatic headache. EXAM: CT HEAD WITHOUT CONTRAST CT CERVICAL SPINE WITHOUT CONTRAST TECHNIQUE: Multidetector CT imaging of the head and cervical spine was performed following the standard protocol without intravenous contrast. Multiplanar CT image reconstructions of the cervical spine were also generated. COMPARISON:  August 17, 2019. FINDINGS: CT HEAD FINDINGS Brain: Mild chronic ischemic white matter disease is noted. No mass effect or midline shift is noted. Ventricular size is within normal limits. There is no evidence of mass lesion, hemorrhage or acute infarction. Vascular: No hyperdense vessel or unexpected calcification. Skull: Normal. Negative for fracture or focal lesion. Sinuses/Orbits: No acute finding. Other: None. CT CERVICAL SPINE FINDINGS Alignment: Mild grade 1 anterolisthesis of C4-5 is noted secondary to posterior facet joint hypertrophy. Skull base and vertebrae: No acute fracture. No  primary bone lesion or focal pathologic  process. Soft tissues and spinal canal: No prevertebral fluid or swelling. No visible canal hematoma. Disc levels:  Mild degenerative disc disease is noted at C4-5. Upper chest: Negative. Other: Degenerative changes are seen involving posterior facet joints bilaterally. IMPRESSION: 1. Mild chronic ischemic white matter disease. No acute intracranial abnormality seen. 2. Multilevel degenerative disc disease. No acute abnormality seen in the cervical spine. Electronically Signed   By: Marijo Conception M.D.   On: 09/06/2019 15:30   CT Cervical Spine Wo Contrast  Result Date: 09/06/2019 CLINICAL DATA:  Posttraumatic headache. EXAM: CT HEAD WITHOUT CONTRAST CT CERVICAL SPINE WITHOUT CONTRAST TECHNIQUE: Multidetector CT imaging of the head and cervical spine was performed following the standard protocol without intravenous contrast. Multiplanar CT image reconstructions of the cervical spine were also generated. COMPARISON:  August 17, 2019. FINDINGS: CT HEAD FINDINGS Brain: Mild chronic ischemic white matter disease is noted. No mass effect or midline shift is noted. Ventricular size is within normal limits. There is no evidence of mass lesion, hemorrhage or acute infarction. Vascular: No hyperdense vessel or unexpected calcification. Skull: Normal. Negative for fracture or focal lesion. Sinuses/Orbits: No acute finding. Other: None. CT CERVICAL SPINE FINDINGS Alignment: Mild grade 1 anterolisthesis of C4-5 is noted secondary to posterior facet joint hypertrophy. Skull base and vertebrae: No acute fracture. No primary bone lesion or focal pathologic process. Soft tissues and spinal canal: No prevertebral fluid or swelling. No visible canal hematoma. Disc levels:  Mild degenerative disc disease is noted at C4-5. Upper chest: Negative. Other: Degenerative changes are seen involving posterior facet joints bilaterally. IMPRESSION: 1. Mild chronic ischemic white matter disease. No acute intracranial abnormality seen. 2.  Multilevel degenerative disc disease. No acute abnormality seen in the cervical spine. Electronically Signed   By: Marijo Conception M.D.   On: 09/06/2019 15:30   DG Chest Port 1 View  Result Date: 09/06/2019 CLINICAL DATA:  Altered mental status, shortness of breath, COVID positive. EXAM: PORTABLE CHEST 1 VIEW COMPARISON:  Chest radiograph 08/17/2019 FINDINGS: Stable cardiomediastinal contours with mildly enlarged heart size. Low volumes. Mild heterogeneous opacities at the left base could reflect atelectasis versus infiltrates. The right lung is clear. Coarsening of the interstitium bilaterally likely chronic bronchitic change. No pneumothorax or large pleural effusion. No acute finding in the visualized skeleton. IMPRESSION: Mild heterogeneous opacities at the left base could reflect atelectasis versus infiltrates. Electronically Signed   By: Audie Pinto M.D.   On: 09/06/2019 13:08    Procedures .Critical Care Performed by: Nettie Elm, PA-C Authorized by: Nettie Elm, PA-C   Critical care provider statement:    Critical care time (minutes):  45   Critical care was necessary to treat or prevent imminent or life-threatening deterioration of the following conditions:  Sepsis, respiratory failure, dehydration and metabolic crisis   Critical care was time spent personally by me on the following activities:  Discussions with consultants, evaluation of patient's response to treatment, examination of patient, ordering and performing treatments and interventions, ordering and review of laboratory studies, ordering and review of radiographic studies, pulse oximetry, re-evaluation of patient's condition, obtaining history from patient or surrogate and review of old charts   (including critical care time)  Medications Ordered in ED Medications  vancomycin (VANCOREADY) IVPB 1500 mg/300 mL (1,500 mg Intravenous New Bag/Given 09/06/19 1410)  ceFEPIme (MAXIPIME) 2 g in sodium chloride 0.9 %  100 mL IVPB (has no administration in time range)  vancomycin variable  dose per unstable renal function (pharmacist dosing) (has no administration in time range)  ceFEPIme (MAXIPIME) 2 g in sodium chloride 0.9 % 100 mL IVPB (0 g Intravenous Stopped 09/06/19 1409)  metroNIDAZOLE (FLAGYL) IVPB 500 mg (0 mg Intravenous Stopped 09/06/19 1459)  lactated ringers bolus 1,000 mL (1,000 mLs Intravenous New Bag/Given 09/06/19 1410)    And  lactated ringers bolus 1,000 mL (0 mLs Intravenous Stopped 09/06/19 1412)   ED Course  I have reviewed the triage vital signs and the nursing notes.  Pertinent labs & imaging results that were available during my care of the patient were reviewed by me and considered in my medical decision making (see chart for details).  84 year old patient presents for evaluation of fever new onset hypoxia.  Diagnosed with Covid with subsequent hospitalization and discharge 2 weeks ago.  On arrival she is febrile, tachycardic, tachypneic with new onset O2 requirement on 3 L.  She has coarse rhonchi.  Altered mental status.  Small stage I sacral wound however does not appear to be infectious.  Per chart review patient did have history of UTI and was discharged with antibiotics after last hospitalization.  Code sepsis called initial evaluation with IV fluids and broad-spectrum antibiotics.  Patient is DNR paper at bedside with MOST form stating no intubation however ok with IVF and ABX. Attempted to consult daughter is listed as contact however brings once and states caller is unable to be reached.  Labs and imaging personally reviewed and interpreted.  Daughter confirms patient is DNR however they would like antibiotics, fluids, intubation if need be.  Patient reassessed. RR 25 in room without acute distress. Continues to have AMS. Question metabolic encephalopathy however given recent fall will obtain CT head and cervical to r/o intracranial pathology. IVF for metabolic derangements. No EKG  changes. Will recheck potassium after IVF before additional meds for hyperK. CTA chest unable to be performed 2/2 acute renal failure. Possible bacterial PNA given rhonchi and possible infiltrate on Chest xray. Unable to perform Perfusion part of VQ given COVID dx per Radiology tech. Low suspicion for ACS. Does not appear to be in shock however critically ill.  Patient care transferred to Dr. Clyde Canterbury who will follow up on imaging, reassessment and discuss with admitting team. Patient will need admission for AMS, sepsis, hypoxia and metabolic derangements.     ADDEND: EKG not pulling into Epic however no ST/T changes. No peaked T waves.  Clinical Course as of Sep 06 1543  Mon Sep 06, 2019  1504 Possible pneumonia  DG Chest Port 1 View [BH]  1505 1.4  Lactic acid, plasma [BH]  1505 No leukocytosis  CBC WITH DIFFERENTIAL(!) [BH]  1505 Hyponatremia, hyperkalemia, elevated chloride, AKI, elevated LFTs  Comprehensive metabolic panel(!!) [BH]    Clinical Course User Index [BH] Nahdia Doucet A, PA-C   MDM Rules/Calculators/A&P                       Final Clinical Impression(s) / ED Diagnoses Final diagnoses:  Hypernatremia  Hyperkalemia  Sepsis with acute hypoxic respiratory failure without septic shock, due to unspecified organism ([place])  Altered mental status, unspecified altered mental status type    Rx / DC Orders ED Discharge Orders    None       Joshaua Epple A, PA-C 09/06/19 1535     Pattricia Boss, MD 09/06/19 1904    Keamber Macfadden A, PA-C 09/07/19 [id number]    Pattricia Boss, MD 09/08/19 1139

## 2019-09-06 NOTE — ED Provider Notes (Signed)
Received call from radiologist that the patient has abnormal VQ scan with high likelihood of PE. Paged admitting medicine physician team to pass on to appropriate people. Already on heparin.    Merrily Pew, MD 09/06/19 2157

## 2019-09-06 NOTE — Progress Notes (Signed)
Pharmacy Antibiotic Note  Leah Waller is a 85 y.o. female admitted on 09/06/2019 with sepsis.  Pharmacy has been consulted for vancomycin and cefepime dosing.  WBC wnl, T 101.5, LA 1.4, RR 31.  SCr 4.38 on admission.   Plan: Vancomycin 1500 mg IV x 1, then variable dosing based on renal function Cefepime 2g IV every 24 hours Monitor renal function, Cx and clinical progression to narrow Vancomycin levels at steady state      Temp (24hrs), Avg:101.5 F (38.6 C), Min:101.5 F (38.6 C), Max:101.5 F (38.6 C)  No results for input(s): WBC, CREATININE, LATICACIDVEN, VANCOTROUGH, VANCOPEAK, VANCORANDOM, GENTTROUGH, GENTPEAK, GENTRANDOM, TOBRATROUGH, TOBRAPEAK, TOBRARND, AMIKACINPEAK, AMIKACINTROU, AMIKACIN in the last 168 hours.  Estimated Creatinine Clearance: 30.3 mL/min (A) (by C-G formula based on SCr of 1.31 mg/dL (H)).    Allergies  Allergen Reactions  . Aspirin Other (See Comments)    Unspecified  . Lisinopril Other (See Comments)    Antimicrobials this admission: Vanc 3/8>> Cefepime 3/8>>  Dose adjustments this admission: n/a  Microbiology results: 3/8 BCx: sent 3/8 UCx: sent  Bertis Ruddy, PharmD Clinical Pharmacist Please check AMION for all [place] numbers 09/06/2019 12:51 PM

## 2019-09-06 NOTE — ED Notes (Signed)
Spoke to Pt's POA [name] Raiford. [name] came in to see the pt but decided not to go into the room due to pt's covid status. This RN discussed the pt's care and status update with POA. POA [name] stated that she wants the pt intubated. POA states she does not remember signing the most form stating she did not want intubation for the pt and would like for [place] to intubate if needed but no compressions.

## 2019-09-06 NOTE — ED Notes (Signed)
SDU  [phone] pts granddaughter Leah Waller

## 2019-09-06 NOTE — H&P (Addendum)
History and Physical    Leah Waller KYH:[id number] DOB: Jan 20, 1935 DOA: 09/06/2019  PCP: Orvis Brill, [facility]  Patient coming from: Leah Waller  I have personally briefly reviewed patient's old medical records in [place]  Chief Complaint: Altered mental status, dehydration  HPI: Leah Waller is a 85 y.o. female with medical history significant of Alzheimer's dementia, essential hypertension, CKD stage III, brought by EMS to the emergency department due to altered mental status.  Patient tested positive for COVID-19 on February 16 and since then she has been declining.  She was treated adequately for COVID-19 and was discharged from the hospital on 08/21/2019.  As per staff at facility patient has been altered, no longer speaking and having difficulty in breathing along with a decrease p.o. intake since 3 days.  Patient was noted to be hypoxic in the 80s and was placed on 6 L of oxygen via nasal cannula at the facility.  EMS was called and brought patient to the emergency department for further evaluation and management.  Patient is not on oxygen at the facility.  No history of fall, seizure, nausea, vomiting, diarrhea, dysuria, wheezing.  ED Course: Upon arrival to ED: Patient is febrile with 1-1.5, BP elevated, respiratory rate in 30s, patient placed on 3 L of oxygen via nasal cannula.  Code sepsis was initiated and patient was given IV fluids and IV Vanco, cefepime and Flagyl in ED.  CT head/cervical spine came back negative for acute findings.  Chest x-ray shows mild heterogeneous opacities at the left base could reflect atelectasis versus infiltrates.  Right hospitalist consulted for further admission.  Review of Systems: As per HPI otherwise negative.    Past Medical History:  Diagnosis Date  . Anxiety   . B12 deficiency   . Bipolar disorder ([place])   . Dementia ([place])   . Hypertension   . RBBB     History reviewed. No pertinent surgical history.   reports that she has quit smoking. She has never used smokeless tobacco. She reports previous alcohol use. She reports previous drug use.  Allergies  Allergen Reactions  . Aspirin Other (See Comments)    Unspecified  . Lisinopril Other (See Comments)    Family History  Family history unknown: Yes    Prior to Admission medications   Medication Sig Start Date End Date Taking? Authorizing Provider  acetaminophen (TYLENOL) 500 MG tablet Take 500 mg by mouth every 4 (four) hours as needed for mild pain or fever.   Yes [provider]  alum & mag hydroxide-simeth (ALUMINA-MAGNESIA-SIMETHICONE) 200-200-20 MG/5ML suspension Take 30 mLs by mouth every 6 (six) hours as needed for indigestion or heartburn.   Yes [provider]  amLODipine (NORVASC) 10 MG tablet Take 1 tablet (10 mg total) by mouth daily. 08/25/19  Yes Allie Bossier, MD  ascorbic acid (VITAMIN C) 500 MG tablet Take 1 tablet (500 mg total) by mouth daily. 08/21/19  Yes Allie Bossier, MD  cyanocobalamin ([facility] VITAMIN B12) 1000 MCG tablet Take 1,000 mcg by mouth daily.  03/17/18  Yes [provider]  donepezil (ARICEPT) 5 MG tablet Take 5 mg by mouth at bedtime.   Yes [provider]  ferrous sulfate 325 (65 FE) MG tablet Take 325 mg by mouth daily with breakfast.   Yes [provider]  guaiFENesin (ROBITUSSIN) 100 MG/5ML liquid Take 200 mg by mouth every 6 (six) hours as needed for cough.   Yes [provider]  Ipratropium-Albuterol ([place])  20-100 MCG/ACT AERS respimat Inhale 1 puff into the lungs 4 (four) times daily. 08/21/19  Yes Allie Bossier, MD  loperamide (IMODIUM) 2 MG capsule Take 2 mg by mouth as needed for diarrhea or loose stools.   Yes [provider]  LORazepam (ATIVAN) 0.5 MG tablet Take 1 tablet (0.5 mg total) by mouth 2 (two) times daily as needed for anxiety. 10/12/18  Yes Ursula Alert, MD  losartan (COZAAR) 50 MG tablet Take 50 mg by mouth daily.    Yes [provider]  magnesium hydroxide (MILK OF MAGNESIA) 400 MG/5ML suspension Take 30 mLs by mouth at bedtime as needed for mild constipation.   Yes [provider]  mirtazapine (REMERON) 7.5 MG tablet Take 1 tablet (7.5 mg total) by mouth at bedtime. For sleep 10/12/18  Yes Ursula Alert, MD  neomycin-bacitracin-polymyxin (NEOSPORIN) [phone] ointment Apply 1 application topically daily as needed (skin tears).   Yes [provider]  ondansetron (ZOFRAN) 4 MG tablet Take 1 tablet (4 mg total) by mouth every 6 (six) hours as needed for nausea. 08/21/19  Yes Allie Bossier, MD  risperiDONE (RISPERDAL) 0.5 MG tablet Take 1-1.5 tablets (0.5-0.75 mg total) by mouth as directed. Take 1 tablet in the AM and 1.5 tablets PM Patient taking differently: Take 0.25-0.5 mg by mouth as directed. Take 1/2 tablet in the AM and 1 tablet PM 10/12/18 08/17/20 Yes Eappen, Ria Clock, MD  zinc sulfate 220 (50 Zn) MG capsule Take 1 capsule (220 mg total) by mouth daily. 08/21/19  Yes Allie Bossier, MD  dexamethasone (DECADRON) 6 MG tablet Take 1 tablet (6 mg total) by mouth daily. Patient not taking: Reported on 09/06/2019 08/24/19   Allie Bossier, MD    Physical Exam: Vitals:   09/06/19 1415 09/06/19 1430 09/06/19 1445 09/06/19 1500  BP: (!) 163/43 (!) 157/45 (!) 154/46 (!) 155/49  Pulse: 80 79 81 77  Resp: (!) 33 (!) 29 (!) 31 (!) 31  Temp:      TempSrc:      SpO2: 100% 100% 100% 100%    Constitutional: NAD, calm, comfortable, alert, sluggish but following commands, on 3 L of oxygen via nasal cannula.  Appears very dehydrated. Eyes: PERRL, lids and conjunctivae normal ENMT: Mucous membranes are moist. Posterior pharynx clear of any exudate or lesions.Normal dentition.  Neck: normal, supple, no masses, no thyromegaly Respiratory: clear to auscultation bilaterally, no wheezing, no crackles. Normal respiratory effort. No accessory muscle use.  Cardiovascular: Regular rate and rhythm,  no murmurs / rubs / gallops. No extremity edema. 2+ pedal pulses. No carotid bruits.  Abdomen: no tenderness, no masses palpated. No hepatosplenomegaly. Bowel sounds positive.  Musculoskeletal: no clubbing / cyanosis. No joint deformity upper and lower extremities. Good ROM, no contractures. Normal muscle tone.  Skin: no rashes, lesions, ulcers. No induration Neurologic: Alert and following commands.  Labs on Admission: I have personally reviewed following labs and imaging studies  CBC: Recent Labs  Lab 09/06/19 1212 09/06/19 1302  WBC 7.6  --   NEUTROABS 6.1  --   HGB 9.8* 9.2*  HCT 34.0* 27.0*  MCV 106.6*  --   PLT 232  --    Basic Metabolic Panel: Recent Labs  Lab 09/06/19 1212 09/06/19 1302  NA 162* 159*  K 5.9* 4.6  CL >130*  --   CO2 17*  --   GLUCOSE 143*  --   BUN 97*  --   CREATININE 4.38*  --   CALCIUM 8.3*  --  GFR: Estimated Creatinine Clearance: 9 mL/min (A) (by C-G formula based on SCr of 4.38 mg/dL (H)). Liver Function Tests: Recent Labs  Lab 09/06/19 1212  AST 54*  ALT 50*  ALKPHOS 110  BILITOT 1.8*  PROT 7.4  ALBUMIN 2.0*   No results for input(s): LIPASE, AMYLASE in the last 168 hours. No results for input(s): AMMONIA in the last 168 hours. Coagulation Profile: Recent Labs  Lab 09/06/19 1212  INR 1.4*   Cardiac Enzymes: No results for input(s): CKTOTAL, CKMB, CKMBINDEX, TROPONINI in the last 168 hours. BNP (last 3 results) No results for input(s): PROBNP in the last 8760 hours. HbA1C: No results for input(s): HGBA1C in the last 72 hours. CBG: No results for input(s): GLUCAP in the last 168 hours. Lipid Profile: No results for input(s): CHOL, HDL, LDLCALC, TRIG, CHOLHDL, LDLDIRECT in the last 72 hours. Thyroid Function Tests: No results for input(s): TSH, T4TOTAL, FREET4, T3FREE, THYROIDAB in the last 72 hours. Anemia Panel: No results for input(s): VITAMINB12, FOLATE, FERRITIN, TIBC, IRON, RETICCTPCT in the last 72 hours. Urine  analysis:    Component Value Date/Time   COLORURINE YELLOW (A) 08/17/2019 1909   APPEARANCEUR HAZY (A) 08/17/2019 1909   LABSPEC 1.015 08/17/2019 1909   PHURINE 5.0 08/17/2019 1909   GLUCOSEU NEGATIVE 08/17/2019 1909   HGBUR NEGATIVE 08/17/2019 1909   BILIRUBINUR NEGATIVE 08/17/2019 1909   KETONESUR NEGATIVE 08/17/2019 1909   PROTEINUR 30 (A) 08/17/2019 1909   NITRITE POSITIVE (A) 08/17/2019 1909   LEUKOCYTESUR MODERATE (A) 08/17/2019 1909    Radiological Exams on Admission: CT Head Wo Contrast  Result Date: 09/06/2019 CLINICAL DATA:  Posttraumatic headache. EXAM: CT HEAD WITHOUT CONTRAST CT CERVICAL SPINE WITHOUT CONTRAST TECHNIQUE: Multidetector CT imaging of the head and cervical spine was performed following the standard protocol without intravenous contrast. Multiplanar CT image reconstructions of the cervical spine were also generated. COMPARISON:  August 17, 2019. FINDINGS: CT HEAD FINDINGS Brain: Mild chronic ischemic white matter disease is noted. No mass effect or midline shift is noted. Ventricular size is within normal limits. There is no evidence of mass lesion, hemorrhage or acute infarction. Vascular: No hyperdense vessel or unexpected calcification. Skull: Normal. Negative for fracture or focal lesion. Sinuses/Orbits: No acute finding. Other: None. CT CERVICAL SPINE FINDINGS Alignment: Mild grade 1 anterolisthesis of C4-5 is noted secondary to posterior facet joint hypertrophy. Skull base and vertebrae: No acute fracture. No primary bone lesion or focal pathologic process. Soft tissues and spinal canal: No prevertebral fluid or swelling. No visible canal hematoma. Disc levels:  Mild degenerative disc disease is noted at C4-5. Upper chest: Negative. Other: Degenerative changes are seen involving posterior facet joints bilaterally. IMPRESSION: 1. Mild chronic ischemic white matter disease. No acute intracranial abnormality seen. 2. Multilevel degenerative disc disease. No acute  abnormality seen in the cervical spine. Electronically Signed   By: Marijo Conception M.D.   On: 09/06/2019 15:30   CT Cervical Spine Wo Contrast  Result Date: 09/06/2019 CLINICAL DATA:  Posttraumatic headache. EXAM: CT HEAD WITHOUT CONTRAST CT CERVICAL SPINE WITHOUT CONTRAST TECHNIQUE: Multidetector CT imaging of the head and cervical spine was performed following the standard protocol without intravenous contrast. Multiplanar CT image reconstructions of the cervical spine were also generated. COMPARISON:  August 17, 2019. FINDINGS: CT HEAD FINDINGS Brain: Mild chronic ischemic white matter disease is noted. No mass effect or midline shift is noted. Ventricular size is within normal limits. There is no evidence of mass lesion, hemorrhage or acute infarction. Vascular:  No hyperdense vessel or unexpected calcification. Skull: Normal. Negative for fracture or focal lesion. Sinuses/Orbits: No acute finding. Other: None. CT CERVICAL SPINE FINDINGS Alignment: Mild grade 1 anterolisthesis of C4-5 is noted secondary to posterior facet joint hypertrophy. Skull base and vertebrae: No acute fracture. No primary bone lesion or focal pathologic process. Soft tissues and spinal canal: No prevertebral fluid or swelling. No visible canal hematoma. Disc levels:  Mild degenerative disc disease is noted at C4-5. Upper chest: Negative. Other: Degenerative changes are seen involving posterior facet joints bilaterally. IMPRESSION: 1. Mild chronic ischemic white matter disease. No acute intracranial abnormality seen. 2. Multilevel degenerative disc disease. No acute abnormality seen in the cervical spine. Electronically Signed   By: Marijo Conception M.D.   On: 09/06/2019 15:30   DG Chest Port 1 View  Result Date: 09/06/2019 CLINICAL DATA:  Altered mental status, shortness of breath, COVID positive. EXAM: PORTABLE CHEST 1 VIEW COMPARISON:  Chest radiograph 08/17/2019 FINDINGS: Stable cardiomediastinal contours with mildly enlarged  heart size. Low volumes. Mild heterogeneous opacities at the left base could reflect atelectasis versus infiltrates. The right lung is clear. Coarsening of the interstitium bilaterally likely chronic bronchitic change. No pneumothorax or large pleural effusion. No acute finding in the visualized skeleton. IMPRESSION: Mild heterogeneous opacities at the left base could reflect atelectasis versus infiltrates. Electronically Signed   By: Audie Pinto M.D.   On: 09/06/2019 13:08    EKG: Sinus rhythm, premature ventricular complexes, no ST elevation or depression noted.  Assessment/Plan Principal Problem:   Sepsis ([place]) Active Problems:   Essential hypertension   Hyperlipidemia   Anxiety   COVID-19 virus infection   Acute metabolic encephalopathy   Depression with anxiety   Acute renal failure superimposed on stage 3a chronic kidney disease (HCC)   Dehydration with hypernatremia   Hyperchloremia   Hyperkalemia   Acute hypoxemic respiratory failure (HCC)   Acute metabolic encephalopathy: -Secondary to electrolyte imbalance in setting of severe dehydration and sepsis -Sodium: 162, chloride: 130, potassium: 5.9 -Patient received IV fluid bolus, vancomycin cefepime and Flagyl in ED. -Chest x-ray as above.  CT head and cervical spine: Negative for acute findings. -We will admit patient to stepdown unit for close monitoring -Continue IV fluids -Keep her n.p.o. until she passes a bedside swallow evaluation -Consulted PT/ST/OT, neuro check every 4 hour -Check TSH  Sepsis: -Due to HCAP?  Patient was Covid positive about 3 weeks ago-treated adequately and was discharged on 08/21/19 -Patient febrile with fever of 101.5, tachypneic, lactic acid,: WNL, UA, UC, BC: Pending -We will get procalcitonin level, urine strep antigen, urine Legionella antigen -Patient received broad-spectrum antibiotics IV Vanco and cefepime and Flagyl in the ED -Continue IV fluids and broad-spectrum antibiotics for  now -Monitor vitals closely.  Acute hypoxemic respiratory failure: New onset -Patient is not on oxygen at facility.  She is tachypneic and requiring 3 L of oxygen here. -On continuous pulse ox.  Reviewed chest x-ray. -We will get VQ scan to rule out pulmonary embolism.  Will start patient on full dose heparin.  AKI on CKD stage III: -Patient's baseline creatinine 1.31, BUN 49 and GFR: 44.  Today patient's creatinine trended up to 4.38, BUN: 97, GFR: 10. -Likely secondary to dehydration due to decreased p.o. intake and underlying infection -Received IV fluid bolus in ED. -Discussed with nephrology Dr. Moshe Cipro who recommended to start patient on half-normal saline at 100 cc/h and she will evaluate the patient tomorrow a.m. -Hold losartan and continue to monitor kidney  function closely.  Hypertension: Blood pressure elevated upon arrival -Hold p.o. home meds-amlodipine and losartan for now -Hydralazine as needed for blood pressure more than 160/100  Macrocytic anemia: H&H is stable -MCV is elevated -Check B12, folate, TSH -Monitor H&H.  Alzheimer's dementia: Hold Aricept, risperidone and Remeron as patient is n.p.o.  Combined systolic and diastolic congestive heart failure: -Patient is currently euvolemic.  Last echo done on 08/20/2019 which showed ejection fraction of 45 to 50% and grade 1 diastolic congestive heart failure. -Strict INO's and daily weight.  Monitor electrolytes closely.  Patient is at increased risk of morbidity and mortality.  Continue to monitor closely at stepdown unit.  Low threshold for transfer to ICU if symptoms get worse.  Please note: Attempted to contact daughter Marinus Maw at [phone] twice with No response.  DVT prophylaxis: Heparin  code Status: Partial, okay with intubation, no CPR Family Communication: None present at bedside.  Plan of care discussed with patient in length and he verbalized understanding and agreed with it. Disposition  Plan: To be determined Consults called: Nephrology Admission status: Inpatient   Mckinley Jewel MD [facility] Pager [phone]  If 7PM-7AM, please contact night-coverage [email or web address] Password [facility]  09/06/2019, 4:29 PM

## 2019-09-06 NOTE — ED Triage Notes (Signed)
Pt arrived via [facility] from [facility]. Pt is currently covid + but has been declining the last 3 days per staff. Staff states pt is altered,no longer speaking and having difficulty breathing. Pt was on RA when EMS arrived and sating 86%. Pt was placed on 6L [place].

## 2019-09-06 NOTE — ED Notes (Signed)
Pt transported to Nuclear medicine

## 2019-09-06 NOTE — ED Provider Notes (Signed)
  Physical Exam  BP (!) 154/46   Pulse 81   Temp (!) 101.5 F (38.6 C) (Rectal)   Resp (!) 31   SpO2 100%   Physical Exam Vitals and nursing note reviewed.  Constitutional:      Appearance: She is ill-appearing.  HENT:     Head: Normocephalic and atraumatic.     Nose: Nose normal. No congestion or rhinorrhea.     Mouth/Throat:     Mouth: Mucous membranes are moist.     Pharynx: Oropharynx is clear.  Eyes:     Conjunctiva/sclera: Conjunctivae normal.  Cardiovascular:     Rate and Rhythm: Normal rate and regular rhythm.     Heart sounds: No murmur.  Pulmonary:     Effort: Pulmonary effort is normal. No respiratory distress.     Breath sounds: Normal breath sounds.  Abdominal:     Palpations: Abdomen is soft.     Tenderness: There is no abdominal tenderness.  Musculoskeletal:        General: Normal range of motion.     Cervical back: Neck supple.  Skin:    General: Skin is warm and dry.  Neurological:     General: No focal deficit present.     Mental Status: She is alert and oriented to person, place, and time.     ED Course/Procedures   Clinical Course as of Sep 05 1516  Mon Sep 06, 2019  1504 Possible pneumonia  DG Chest Port 1 View [BH]  1505 1.4  Lactic acid, plasma [BH]  1505 No leukocytosis  CBC WITH DIFFERENTIAL(!) [BH]  1505 Hyponatremia, hyperkalemia, elevated chloride, AKI, elevated LFTs  Comprehensive metabolic panel(!!) [BH]    Clinical Course User Index [BH] Henderly, Britni A, PA-C    Procedures  MDM  Assumed care from previous provider at 1500. In brief, pt presenting to the ED from nursing home due to AMS and dehydration. She was tachypneic and febrile on arrival. She has a new O2 requirement and potential PNA on CXR. Labwork concerning for AKI. Pt has received IVF and broad spectrum abx. At time of handoff, plan is to f/u [facility] and admit.  CT head and Cspine negative for acute abnormalities. Pt admitted to Internal Medicine Service. For  details on ED course following admission, please refer to inpt team notes. Pt stable at time of handoff.   Pt assessed and evaluated with Dr. Dayna Barker.   Nadeen Landau, MD      Nadeen Landau, MD 09/07/19 6812    Merrily Pew, MD 09/10/19 [phone]

## 2019-09-06 NOTE — Progress Notes (Signed)
ANTICOAGULATION CONSULT NOTE - Initial Consult  Pharmacy Consult for Heparin Indication: Possible PE   Allergies  Allergen Reactions  . Aspirin Other (See Comments)    Unspecified  . Lisinopril Other (See Comments)    Patient Measurements:   Heparin Dosing Weight: 66.4 kg  Vital Signs: Temp: 98.9 F (37.2 C) (03/08 1811) Temp Source: Axillary (03/08 1811) BP: 138/47 (03/08 1745) Pulse Rate: 70 (03/08 1745)  Labs: Recent Labs    09/06/19 1212 09/06/19 1212 09/06/19 1302 09/06/19 1704  HGB 9.8*   < > 9.2* 8.6*  HCT 34.0*  --  27.0* 30.1*  PLT 232  --   --  178  APTT 26  --   --   --   LABPROT 16.7*  --   --   --   INR 1.4*  --   --   --   CREATININE 4.38*  --   --  3.68*   < > = values in this interval not displayed.    Estimated Creatinine Clearance: 10.8 mL/min (A) (by C-G formula based on SCr of 3.68 mg/dL (H)).   Medical History: Past Medical History:  Diagnosis Date  . Anxiety   . B12 deficiency   . Bipolar disorder ([place])   . Dementia ([place])   . Hypertension   . RBBB     Medications:  Scheduled:  . heparin  4,000 Units Intravenous Once  . ipratropium-albuterol  3 mL Inhalation QID  . vancomycin variable dose per unstable renal function (pharmacist dosing)   Does not apply See admin instructions    Assessment: Patient is a 24 yof that was recently diagnosed with COVID ~ 3 weeks ago that presents with new acute SOB with increasing O2 requirements. There is concern for a PE however unable to obtain CTPE 2/2 renal function, VQ scan ordered for AM. Pharmacy has been asked to dose heparin at this time for possible PE. Hgb noted at 8.6 no s/s of bleeding.   Goal of Therapy:  Heparin level 0.3-0.7 units/ml Monitor platelets by anticoagulation protocol: Yes   Plan:  - Heparin 4000 units IV x 1 dose  - Followed by Heparin drip @ 1000 units/hr  - Heparin level in ~8 hours  - Monitor patient for s/s of bleeding and CBC while on heparin - Follow up on VQ  scan in AM   Duanne Limerick PharmD. BCPS  09/06/2019,6:24 PM

## 2019-09-07 ENCOUNTER — Inpatient Hospital Stay (HOSPITAL_COMMUNITY): Payer: Medicare HMO

## 2019-09-07 DIAGNOSIS — J9601 Acute respiratory failure with hypoxia: Secondary | ICD-10-CM

## 2019-09-07 LAB — HEPARIN LEVEL (UNFRACTIONATED)
Heparin Unfractionated: 0.24 IU/mL — ABNORMAL LOW (ref 0.30–0.70)
Heparin Unfractionated: 0.78 IU/mL — ABNORMAL HIGH (ref 0.30–0.70)

## 2019-09-07 LAB — COMPREHENSIVE METABOLIC PANEL
ALT: 40 U/L (ref 0–44)
AST: 35 U/L (ref 15–41)
Albumin: 1.8 g/dL — ABNORMAL LOW (ref 3.5–5.0)
Alkaline Phosphatase: 84 U/L (ref 38–126)
Anion gap: 13 (ref 5–15)
BUN: 87 mg/dL — ABNORMAL HIGH (ref 8–23)
CO2: 17 mmol/L — ABNORMAL LOW (ref 22–32)
Calcium: 8.2 mg/dL — ABNORMAL LOW (ref 8.9–10.3)
Chloride: 129 mmol/L — ABNORMAL HIGH (ref 98–111)
Creatinine, Ser: 3.21 mg/dL — ABNORMAL HIGH (ref 0.44–1.00)
GFR calc Af Amer: 15 mL/min — ABNORMAL LOW (ref >60)
GFR calc non Af Amer: 13 mL/min — ABNORMAL LOW (ref >60)
Glucose, Bld: 110 mg/dL — ABNORMAL HIGH (ref 70–99)
Potassium: 4.6 mmol/L (ref 3.5–5.1)
Sodium: 159 mmol/L — ABNORMAL HIGH (ref 135–145)
Total Bilirubin: 1.4 mg/dL — ABNORMAL HIGH (ref 0.3–1.2)
Total Protein: 6.3 g/dL — ABNORMAL LOW (ref 6.5–8.1)

## 2019-09-07 LAB — CBC
HCT: 30.4 % — ABNORMAL LOW (ref 36.0–46.0)
Hemoglobin: 8.8 g/dL — ABNORMAL LOW (ref 12.0–15.0)
MCH: 31 pg (ref 26.0–34.0)
MCHC: 28.9 g/dL — ABNORMAL LOW (ref 30.0–36.0)
MCV: 107 fL — ABNORMAL HIGH (ref 80.0–100.0)
Platelets: 188 10*3/uL (ref 150–400)
RBC: 2.84 MIL/uL — ABNORMAL LOW (ref 3.87–5.11)
RDW: 14.8 % (ref 11.5–15.5)
WBC: 5.6 10*3/uL (ref 4.0–10.5)
nRBC: 0 % (ref 0.0–0.2)

## 2019-09-07 LAB — VITAMIN B12: Vitamin B-12: 1417 pg/mL — ABNORMAL HIGH (ref 180–914)

## 2019-09-07 LAB — SODIUM: Sodium: 155 mmol/L — ABNORMAL HIGH (ref 135–145)

## 2019-09-07 LAB — STREP PNEUMONIAE URINARY ANTIGEN: Strep Pneumo Urinary Antigen: NEGATIVE

## 2019-09-07 MED ORDER — IPRATROPIUM-ALBUTEROL 0.5-2.5 (3) MG/3ML IN SOLN: 3.0000 mL | Freq: Four times a day (QID) | RESPIRATORY_TRACT | Status: DC | PRN

## 2019-09-07 MED ORDER — HEPARIN BOLUS VIA INFUSION
2000.0000 [IU] | Freq: Once | INTRAVENOUS | Status: AC
  Administered 2019-09-07: 2000 [IU] via INTRAVENOUS
  Filled 2019-09-07: qty 2000

## 2019-09-07 NOTE — Progress Notes (Signed)
[place] for Heparin Indication: Possible PE   Allergies  Allergen Reactions  . Aspirin Other (See Comments)    Unspecified  . Lisinopril Other (See Comments)    Patient Measurements: Height: 5\' 3"  (160 cm) Weight: 151 lb 3.8 oz (68.6 kg) IBW/kg (Calculated) : 52.4 Heparin Dosing Weight: 66.4 kg  Vital Signs: Temp: 97.4 F (36.3 C) (03/09 1200) Temp Source: Oral (03/09 1200) BP: 103/87 (03/09 1200) Pulse Rate: 65 (03/09 1200)  Labs: Recent Labs    09/06/19 1212 09/06/19 1212 09/06/19 1302 09/06/19 1302 09/06/19 1704 09/07/19 0236 09/07/19 1242  HGB 9.8*   < > 9.2*   < > 8.6* 8.8*  --   HCT 34.0*   < > 27.0*  --  30.1* 30.4*  --   PLT 232  --   --   --  178 188  --   APTT 26  --   --   --   --   --   --   LABPROT 16.7*  --   --   --   --   --   --   INR 1.4*  --   --   --   --   --   --   HEPARINUNFRC  --   --   --   --   --  0.24* 0.78*  CREATININE 4.38*  --   --   --  3.68* 3.21*  --    < > = values in this interval not displayed.    Estimated Creatinine Clearance: 12.3 mL/min (A) (by C-G formula based on SCr of 3.21 mg/dL (H)).   Medical History: Past Medical History:  Diagnosis Date  . Anxiety   . B12 deficiency   . Bipolar disorder ([place])   . Dementia ([place])   . Hypertension   . RBBB     Medications:  Scheduled:  . ipratropium-albuterol  3 mL Inhalation QID  . vancomycin variable dose per unstable renal function (pharmacist dosing)   Does not apply See admin instructions    Assessment: 55 yof recently diagnosed with COVID ~3 weeks ago presenting with new acute SOB with increasing O2 requirements. VQ scan with high probability for PE. Pharmacy consulted to dose heparin. CBC stable. No active bleed issues reported. Patient is not on anticoagulation PTA. Noted AKI - SCr trending down.  Heparin level now elevated at 0.78 after rate increase this AM. CBC stable. No bleeding or issues with infusion per discussion  with RN.  Goal of Therapy:  Heparin level 0.3-0.7 units/ml Monitor platelets by anticoagulation protocol: Yes   Plan:  Decrease heparin to 1100 units/hr 8h heparin level Monitor daily heparin level and CBC, s/sx bleeding   Arturo Morton, PharmD, BCPS Please check AMION for all [place] contact numbers Clinical Pharmacist 09/07/2019 2:57 PM

## 2019-09-07 NOTE — Plan of Care (Signed)

## 2019-09-07 NOTE — ED Notes (Signed)
Attempted to call report to 2W x 1

## 2019-09-07 NOTE — Progress Notes (Signed)
Chart reviewed-patient seen and examined. Subsequently spoke with patients daughter-[name] over the phone-given patients multiple issues-chronic fraility-I have recommended full DNR status. There is out of facility DNR and MOST form at bedside as well. She clearly is not a candidate for aggressive care.  Daughter agreeable.  DNR ordered.  Full note to follow

## 2019-09-07 NOTE — Progress Notes (Signed)
ANTICOAGULATION CONSULT NOTE - Follow Up Consult  Pharmacy Consult for heparin Indication: pulmonary embolus  Labs: Recent Labs    09/06/19 1212 09/06/19 1212 09/06/19 1302 09/06/19 1302 09/06/19 1704 09/07/19 0236  HGB 9.8*   < > 9.2*   < > 8.6* 8.8*  HCT 34.0*   < > 27.0*  --  30.1* 30.4*  PLT 232  --   --   --  178 188  APTT 26  --   --   --   --   --   LABPROT 16.7*  --   --   --   --   --   INR 1.4*  --   --   --   --   --   HEPARINUNFRC  --   --   --   --   --  0.24*  CREATININE 4.38*  --   --   --  3.68* 3.21*   < > = values in this interval not displayed.    Assessment: 85yo female subtherapeutic on heparin with initial dosing for PE; no signs of bleeding per RN but she does note that pt has been bending her arm frequently, causing possible infusion interruptions, now w/ arm board to keep arm straight.  Goal of Therapy:  Heparin level 0.3-0.7 units/ml   Plan:  Will rebolus with heparin 2000 units and increase heparin gtt by 3 units/kg/hr to 1200 units/hr and check level in 8 hours.    Wynona Neat, PharmD, BCPS  09/07/2019,3:53 AM

## 2019-09-07 NOTE — ED Notes (Signed)
Report called to floor

## 2019-09-07 NOTE — Evaluation (Signed)
Clinical/Bedside Swallow Evaluation Patient Details  Name: Leah Waller MRN: [id number] Date of Birth: 1935-05-12  Today's Date: 09/07/2019 Time: SLP Start Time (ACUTE ONLY): 2025 SLP Stop Time (ACUTE ONLY): 4270 SLP Time Calculation (min) (ACUTE ONLY): 34 min  Past Medical History:  Past Medical History:  Diagnosis Date  . Anxiety   . B12 deficiency   . Bipolar disorder ([place])   . Dementia ([place])   . Hypertension   . RBBB    Past Surgical History: History reviewed. No pertinent surgical history. HPI:  Pt is an 85 yo female admitted with AMS in the setting of AKI, hyponatremia, possible aspiration PNA, and concern for PE. Pt was recently admitted to [facility] for COVID-19 2/17-2/23. PMH also includes: dementia, HTN, CKD stage III   Assessment / Plan / Recommendation Clinical Impression  Pt appears to be at increased risk for aspiration in the setting of altered mentation and generalized weakness. She has consistent, immediate coughing with thin and nectar thick liquids that is concerning for aspiration, as well as baseline audible congestion that could suggest decreased secretion management. She has slow oral transit of purees and delayed wet vocal quality/coughing could indicate decreased airway protection with purees and/or secretions as well. Volitional coughs cannot consistently be elicited and even when they are, they appear to be weak and ineffective at clearing vocal quality. A safe diet cannot be recommended clinically. Would focus on thorough oral care today and SLP will plan to f/u on next date for potential to start POs versus MBS if within overall GOC as additional w/u continues.   SLP Visit Diagnosis: Dysphagia, oropharyngeal phase (R13.12)    Aspiration Risk  Moderate aspiration risk;Severe aspiration risk    Diet Recommendation NPO   Medication Administration: Via alternative means    Other  Recommendations Oral Care Recommendations: Oral care QID Other  Recommendations: Have oral suction available   Follow up Recommendations Skilled Nursing facility      Frequency and Duration min 2x/week  2 weeks       Prognosis Prognosis for Safe Diet Advancement: Fair Barriers to Reach Goals: Cognitive deficits;Other (Comment)(overall medical prognosis)      Swallow Study   General HPI: Pt is an 85 yo female admitted with AMS in the setting of AKI, hyponatremia, possible aspiration PNA, and concern for PE. Pt was recently admitted to [facility] for COVID-19 2/17-2/23. PMH also includes: dementia, HTN, CKD stage III Type of Study: Bedside Swallow Evaluation Previous Swallow Assessment: none in chart Diet Prior to this Study: NPO Temperature Spikes Noted: Yes(101.5) Respiratory Status: Nasal cannula History of Recent Intubation: No Behavior/Cognition: Alert;Cooperative;Requires cueing Oral Cavity Assessment: Within Functional Limits Oral Care Completed by SLP: No Oral Cavity - Dentition: Adequate natural dentition;Missing dentition Vision: Functional for self-feeding Self-Feeding Abilities: Needs assist Patient Positioning: Upright in bed Baseline Vocal Quality: Low vocal intensity Volitional Cough: Cognitively unable to elicit(only intermittently elicited) Volitional Swallow: Able to elicit    Oral/Motor/Sensory Function Overall Oral Motor/Sensory Function: (appears to be symmetrical but not following commands well)   Ice Chips Ice chips: Impaired Presentation: Spoon Oral Phase Functional Implications: Oral holding   Thin Liquid Thin Liquid: Impaired Presentation: Cup;Spoon Oral Phase Functional Implications: Oral holding Pharyngeal  Phase Impairments: Cough - Immediate    Nectar Thick Nectar Thick Liquid: Impaired Presentation: Cup;Straw Oral phase functional implications: Oral holding Pharyngeal Phase Impairments: Cough - Immediate;Cough - Delayed;Wet Vocal Quality   Honey Thick Honey Thick Liquid: Not tested   Puree Puree:  Impaired Presentation:  Spoon Oral Phase Functional Implications: Oral holding Pharyngeal Phase Impairments: Wet Vocal Quality;Cough - Delayed   Solid     Solid: Not tested       Osie Bond., M.A. [place] Pager [phone] Office [phone]  09/07/2019,4:08 PM

## 2019-09-07 NOTE — Progress Notes (Signed)
PT Cancellation Note  Patient Details Name: Leah Waller MRN: [id number] DOB: Dec 12, 1934   Cancelled Treatment:    Reason Eval/Treat Not Completed: Medical issues which prohibited therapy Pt with high probability for PE and started on heparin at 2029 on 09/06/19. Per protocol, must wait 24 hours to initiate PT after receiving heparin. Will hold until pt medically appropriate and follow up as schedule allows.   Lou Miner, DPT  Acute Rehabilitation Services  Pager: [phone] Office: [phone]    Rudean Hitt 09/07/2019, 11:38 AM

## 2019-09-07 NOTE — Progress Notes (Addendum)
PROGRESS NOTE        PATIENT DETAILS Name: Leah Waller Age: 85 y.o. Sex: female Date of Birth: [birth year] Admit Date: 09/06/2019 Admitting Physician Mckinley Jewel, MD CBJ:[id number], [facility]  Brief Narrative: Patient is a 85 y.o. female with past medical history of dementia, HTN, CKD stage III-recent history of COVID-19 infection requiring hospitalization-presenting to the hospital with acute metabolic encephalopathy in the setting of AKI, hyponatremia and possible aspiration pneumonia.  Upon further imaging in the emergency room-likely thought to have pulmonary embolism as well.  See below for further details.  Significant events: 2/17-2/23>> admit to [facility] for [id number] pneumonia-complicated by acute metabolic encephalopathy and bradycardia. 3/8>> admit to [facility] for encephalopathy due to AKI, hypernatremia, aspiration pneumonia and possible PE. 3/8>> VQ scan- high probability for PE-started on IV heparin  Antimicrobial therapy: Cefepime: 3/8>> Vancomycin: 3/8>>  Microbiology data: 3/8>> blood culture: Pending 3/8>> urine culture: Pending  Procedures : None  Consults: None  DVT Prophylaxis : Full dose anticoagulation with Heparin  Subjective: Lying comfortably in bed-somewhat confused-but able to follow simple commands.  Appears to be pulling secretions-"gurgling"  Assessment/Plan: Acute metabolic encephalopathy: Secondary to hypernatremia, AKI and probable aspiration pneumonia.  Improving with supportive care-CT head without acute findings.  AKI a stage IIIa: Likely hemodynamically mediated-in the setting of poor oral intake-sepsis-and use of losartan.UA without significant proteinuria.  AKI improving-continue supportive care. Check Renal ultrasound.  Hypernatremia: Secondary to poor oral intake-improving with hypotonic saline.  Follow.  Acute hypoxemic respiratory failure: Suspect secondary to pulmonary embolism and likely  aspiration pneumonia.  On empiric antimicrobial therapy and IV heparin-slowly attempt to titrate off oxygen.  Pulmonary embolism: Likely provoked by recent Covid infection-sedentary lifestyle.  VQ scan on 3/8-with high probability.  Continue IV heparin.  Check Dopplers of lower extremity.  Sepsis likely secondary to aspiration pneumonia: He is pooling secretions this morning-high suspicion for aspiration pneumonia in the setting of dementia and generalized weakness/debility from recent Covid infection.  Continue vancomycin/cefepime-await culture data before narrowing antimicrobial spectrum.  Recent COVID-19 pneumonia with resultant debility/deconditioning and failure to thrive syndrome: Supportive care-I have ordered PT/OT and SLP eval.  Does not have active Covid pneumonitis at this time.  Chronic combined systolic and diastolic heart failure (EF 45-50% by TTE on 08/20/2019): Volume status stable-follow closely while on IVF.  HTN: BP currently controlled-amlodipine, losartan on hold.  Dementia: Currently with encephalopathy-but at risk of delirium.  Will resume risperidone, Remeron  Goals of care: Given patient's overall frailty-dementia-multiple chronic comorbidities as outlined above-now acutely ill with AKI, hypernatremia, aspiration pneumonia and PE-she remains at risk for decompensation and further deterioration.  Patient's daughter is aware of the tenuous overall clinical situation-I have recommended that given overall poor prognosis-we make her a full DNR ([place]/MOST form at present bedside).  Plan is to continue treatment of the acute issues to see if she recovers-if she deteriorates-daughter is aware that we may need to transition to hospice care.   Diet: Diet Order            Diet NPO time specified  Diet effective now               Code Status:  DNR  Family Communication: Spoke with daughter over phone  Disposition Plan: SNF when ready for  discharge  Barriers to Discharge: Sepsis secondary to aspiration pneumonia-on IV  antibiotics, severe AKI and hypernatremia.  Antimicrobial agents: Anti-infectives (From admission, onward)   Start     Dose/Rate Route Frequency Ordered Stop   09/07/19 1200  ceFEPIme (MAXIPIME) 2 g in sodium chloride 0.9 % 100 mL IVPB     2 g 200 mL/hr over 30 Minutes Intravenous Every 24 hours 09/06/19 1410     09/06/19 1745  ceFEPIme (MAXIPIME) 2 g in sodium chloride 0.9 % 100 mL IVPB  Status:  Discontinued     2 g 200 mL/hr over 30 Minutes Intravenous  Once 09/06/19 1730 09/06/19 1747   09/06/19 1745  vancomycin (VANCOREADY) IVPB 1500 mg/300 mL  Status:  Discontinued     1,500 mg 150 mL/hr over 120 Minutes Intravenous  Once 09/06/19 1730 09/06/19 1747   09/06/19 1410  vancomycin variable dose per unstable renal function (pharmacist dosing)      Does not apply See admin instructions 09/06/19 1410     09/06/19 1300  vancomycin (VANCOREADY) IVPB 1500 mg/300 mL     1,500 mg 150 mL/hr over 120 Minutes Intravenous  Once 09/06/19 1254 09/06/19 1617   09/06/19 1215  ceFEPIme (MAXIPIME) 2 g in sodium chloride 0.9 % 100 mL IVPB     2 g 200 mL/hr over 30 Minutes Intravenous  Once 09/06/19 1214 09/06/19 1409   09/06/19 1215  metroNIDAZOLE (FLAGYL) IVPB 500 mg     500 mg 100 mL/hr over 60 Minutes Intravenous  Once 09/06/19 1214 09/06/19 1459   09/06/19 1215  vancomycin (VANCOCIN) IVPB 1000 mg/200 mL premix  Status:  Discontinued     1,000 mg 200 mL/hr over 60 Minutes Intravenous  Once 09/06/19 1214 09/06/19 1254       Time spent: 35 minutes-Greater than 50% of this time was spent in counseling, explanation of diagnosis, planning of further management, and coordination of care.  MEDICATIONS: Scheduled Meds: . ipratropium-albuterol  3 mL Inhalation QID  . vancomycin variable dose per unstable renal function (pharmacist dosing)   Does not apply See admin instructions   Continuous Infusions: . sodium  chloride 100 mL/hr at 09/07/19 0545  . ceFEPime (MAXIPIME) IV    . heparin 1,200 Units/hr (09/07/19 0356)   PRN Meds:.acetaminophen **OR** acetaminophen, hydrALAZINE, ondansetron **OR** ondansetron (ZOFRAN) IV   PHYSICAL EXAM: Vital signs: Vitals:   09/07/19 0930 09/07/19 0945 09/07/19 1000 09/07/19 1015  BP: (!) 130/56  (!) 138/58 (!) 134/53  Pulse: 63 65 71 66  Resp: 20 19 (!) 23 (!) 23  Temp:      TempSrc:      SpO2: 99% 99% 100% 100%  Weight:      Height:       Filed Weights   09/06/19 1812  Weight: 68.6 kg   Body mass index is 26.79 kg/m.   Gen Exam: Confused but not in any distress. HEENT:atraumatic, normocephalic Chest: B/L clear to auscultation anteriorly CVS:S1S2 regular Abdomen:soft non tender, non distended Extremities:no edema Neurology: Difficult exam but with generalized weakness-however appears to be nonfocal. Skin: no rash  I have personally reviewed following labs and imaging studies  LABORATORY DATA: CBC: Recent Labs  Lab 09/06/19 1212 09/06/19 1302 09/06/19 1704 09/07/19 0236  WBC 7.6  --  7.4 5.6  NEUTROABS 6.1  --   --   --   HGB 9.8* 9.2* 8.6* 8.8*  HCT 34.0* 27.0* 30.1* 30.4*  MCV 106.6*  --  107.1* 107.0*  PLT 232  --  178 419    Basic Metabolic Panel: Recent Labs  Lab 09/06/19 1212 09/06/19 1302 09/06/19 1704 09/07/19 0236  NA 162* 159*  --  159*  K 5.9* 4.6  --  4.6  CL >130*  --   --  129*  CO2 17*  --   --  17*  GLUCOSE 143*  --   --  110*  BUN 97*  --   --  87*  CREATININE 4.38*  --  3.68* 3.21*  CALCIUM 8.3*  --   --  8.2*  MG  --   --  2.9*  --   PHOS  --   --  5.8*  --     GFR: Estimated Creatinine Clearance: 12.3 mL/min (A) (by C-G formula based on SCr of 3.21 mg/dL (H)).  Liver Function Tests: Recent Labs  Lab 09/06/19 1212 09/07/19 0236  AST 54* 35  ALT 50* 40  ALKPHOS 110 84  BILITOT 1.8* 1.4*  PROT 7.4 6.3*  ALBUMIN 2.0* 1.8*   No results for input(s): LIPASE, AMYLASE in the last 168  hours. No results for input(s): AMMONIA in the last 168 hours.  Coagulation Profile: Recent Labs  Lab 09/06/19 1212  INR 1.4*    Cardiac Enzymes: No results for input(s): CKTOTAL, CKMB, CKMBINDEX, TROPONINI in the last 168 hours.  BNP (last 3 results) No results for input(s): PROBNP in the last 8760 hours.  Lipid Profile: No results for input(s): CHOL, HDL, LDLCALC, TRIG, CHOLHDL, LDLDIRECT in the last 72 hours.  Thyroid Function Tests: No results for input(s): TSH, T4TOTAL, FREET4, T3FREE, THYROIDAB in the last 72 hours.  Anemia Panel: Recent Labs    09/07/19 0232  VITAMINB12 1,417*    Urine analysis:    Component Value Date/Time   COLORURINE AMBER (A) 09/06/2019 2204   APPEARANCEUR CLOUDY (A) 09/06/2019 2204   LABSPEC 1.019 09/06/2019 2204   PHURINE 5.0 09/06/2019 2204   GLUCOSEU NEGATIVE 09/06/2019 2204   HGBUR SMALL (A) 09/06/2019 2204   BILIRUBINUR NEGATIVE 09/06/2019 [place] 09/06/2019 2204   PROTEINUR 30 (A) 09/06/2019 2204   NITRITE NEGATIVE 09/06/2019 2204   LEUKOCYTESUR SMALL (A) 09/06/2019 2204    Sepsis Labs: Lactic Acid, Venous    Component Value Date/Time   LATICACIDVEN 1.4 09/06/2019 1212    MICROBIOLOGY: No results found for this or any previous visit (from the past 240 hour(s)).  RADIOLOGY STUDIES/RESULTS: CT Head Wo Contrast  Result Date: 09/06/2019 CLINICAL DATA:  Posttraumatic headache. EXAM: CT HEAD WITHOUT CONTRAST CT CERVICAL SPINE WITHOUT CONTRAST TECHNIQUE: Multidetector CT imaging of the head and cervical spine was performed following the standard protocol without intravenous contrast. Multiplanar CT image reconstructions of the cervical spine were also generated. COMPARISON:  August 17, 2019. FINDINGS: CT HEAD FINDINGS Brain: Mild chronic ischemic white matter disease is noted. No mass effect or midline shift is noted. Ventricular size is within normal limits. There is no evidence of mass lesion, hemorrhage or  acute infarction. Vascular: No hyperdense vessel or unexpected calcification. Skull: Normal. Negative for fracture or focal lesion. Sinuses/Orbits: No acute finding. Other: None. CT CERVICAL SPINE FINDINGS Alignment: Mild grade 1 anterolisthesis of C4-5 is noted secondary to posterior facet joint hypertrophy. Skull base and vertebrae: No acute fracture. No primary bone lesion or focal pathologic process. Soft tissues and spinal canal: No prevertebral fluid or swelling. No visible canal hematoma. Disc levels:  Mild degenerative disc disease is noted at C4-5. Upper chest: Negative. Other: Degenerative changes are seen involving posterior facet joints bilaterally. IMPRESSION: 1. Mild chronic ischemic white  matter disease. No acute intracranial abnormality seen. 2. Multilevel degenerative disc disease. No acute abnormality seen in the cervical spine. Electronically Signed   By: Marijo Conception M.D.   On: 09/06/2019 15:30   CT Cervical Spine Wo Contrast  Result Date: 09/06/2019 CLINICAL DATA:  Posttraumatic headache. EXAM: CT HEAD WITHOUT CONTRAST CT CERVICAL SPINE WITHOUT CONTRAST TECHNIQUE: Multidetector CT imaging of the head and cervical spine was performed following the standard protocol without intravenous contrast. Multiplanar CT image reconstructions of the cervical spine were also generated. COMPARISON:  August 17, 2019. FINDINGS: CT HEAD FINDINGS Brain: Mild chronic ischemic white matter disease is noted. No mass effect or midline shift is noted. Ventricular size is within normal limits. There is no evidence of mass lesion, hemorrhage or acute infarction. Vascular: No hyperdense vessel or unexpected calcification. Skull: Normal. Negative for fracture or focal lesion. Sinuses/Orbits: No acute finding. Other: None. CT CERVICAL SPINE FINDINGS Alignment: Mild grade 1 anterolisthesis of C4-5 is noted secondary to posterior facet joint hypertrophy. Skull base and vertebrae: No acute fracture. No primary bone  lesion or focal pathologic process. Soft tissues and spinal canal: No prevertebral fluid or swelling. No visible canal hematoma. Disc levels:  Mild degenerative disc disease is noted at C4-5. Upper chest: Negative. Other: Degenerative changes are seen involving posterior facet joints bilaterally. IMPRESSION: 1. Mild chronic ischemic white matter disease. No acute intracranial abnormality seen. 2. Multilevel degenerative disc disease. No acute abnormality seen in the cervical spine. Electronically Signed   By: Marijo Conception M.D.   On: 09/06/2019 15:30   NM Pulmonary Perfusion  Addendum Date: 09/07/2019   ADDENDUM REPORT: 09/07/2019 00:34 ADDENDUM: I discussed critical Value/emergent results were by telephone at the time of interpretation on 09/07/2019 at 21:50 pm to provider Dr. Laverta Baltimore, who verbally acknowledged these results. Electronically Signed   By: Revonda Humphrey   On: 09/07/2019 00:34   Result Date: 09/07/2019 CLINICAL DATA:  Dyspnea and concern for pulmonary embolism. EXAM: NUCLEAR MEDICINE PERFUSION LUNG SCAN TECHNIQUE: Perfusion images were obtained in multiple projections after intravenous injection of radiopharmaceutical. Ventilation scans intentionally deferred if perfusion scan and chest x-ray adequate for interpretation during COVID-19 epidemic. A technical note states the patient is nonresponsive and lateral scintigraphic images were not acquired. RADIOPHARMACEUTICALS:  1.56 mCi Tc-40m MAA IV COMPARISON:  September 11, 2005. Chest radiography performed earlier on the same date. FINDINGS: There is a large left basilar wedge-shaped perfusion defect that is larger than the area of patchy left medial basilar consolidation on radiography, the perfusion-radiograph mismatch equal to at least 2 pulmonary segments. Ventilation images were deferred. A stat PRA communication was initiated. IMPRESSION: Nuclear medicine perfusion imaging with high probability of pulmonary embolism according to the modified PIOPED II  criteria. Electronically Signed: By: Revonda Humphrey On: 09/06/2019 21:45   DG Chest Port 1 View  Result Date: 09/06/2019 CLINICAL DATA:  Altered mental status, shortness of breath, COVID positive. EXAM: PORTABLE CHEST 1 VIEW COMPARISON:  Chest radiograph 08/17/2019 FINDINGS: Stable cardiomediastinal contours with mildly enlarged heart size. Low volumes. Mild heterogeneous opacities at the left base could reflect atelectasis versus infiltrates. The right lung is clear. Coarsening of the interstitium bilaterally likely chronic bronchitic change. No pneumothorax or large pleural effusion. No acute finding in the visualized skeleton. IMPRESSION: Mild heterogeneous opacities at the left base could reflect atelectasis versus infiltrates. Electronically Signed   By: Audie Pinto M.D.   On: 09/06/2019 13:08     LOS: 1 day   Aneira Cavitt  Rola Lennon, MD  [facility]    To contact the attending provider between 7A-7P or the covering provider during after hours 7P-7A, please log into the web site [email or web address] and access using universal [place] password for that web site. If you do not have the password, please call the hospital operator.  09/07/2019, 11:17 AM

## 2019-09-07 NOTE — Consult Note (Signed)
Leah Waller Admit Date: 09/06/2019 09/07/2019 Rexene Agent Requesting Physician:  Doristine Bosworth MD  Reason for Consult:  Hypernatremia, AoCKD3 HPI:  82F PMH including Alzheimer's dementia, hypertension, CKD 3 with baseline creatinine around 1.3 to 1.7 COVID-19 infection last month (2/17 to 08/24/2019, treated with remdesivir and Decadron; discharged to SNF; complicated by E. coli UTI, dehydration with hypernatremia) who was admitted yesterday after change in mental status at nursing facility with hypoxia.  Her initial work-up included VQ scan, high probability for PE, and she has been placed on systemic heparin.  Further, imaging with concern for aspiration pneumonia and placed on vancomycin, cefepime.  Found to have acute on chronic renal insufficiency with a creatinine of 4.4 from a discharge creatinine of 1.3.  Further, she was hyponatremic with a sodium of 162.  At admission she was placed on one half normal saline at 100 mL/h.  Serum sodium is improved to 159.  Creatinine is improved today to 3.2 from 4.4.  She is incontinent of urine.  UA on 3/8 with no hematuria.  Trace proteinuria and 6-10 leukocytes per high-powered field present.  Blood and urine cultures are pending.  At the current time she remains encephalopathic.  Her eyes are open.  She does not answer questions.  She follows very basic commands.  It is unclear what her baseline mental status is.  No family is available.  Home medications include losartan  Sodium (mmol/L)  Date Value  09/07/2019 159 (H)  09/06/2019 159 (H)  09/06/2019 162 (HH)  08/24/2019 143  08/22/2019 143  08/21/2019 141  08/20/2019 144  08/19/2019 147 (H)  08/18/2019 142  08/17/2019 147 (H)  ]  Creatinine, Ser (mg/dL)  Date Value  09/07/2019 3.21 (H)  09/06/2019 3.68 (H)  09/06/2019 4.38 (H)  08/24/2019 1.31 (H)  08/22/2019 1.29 (H)  08/21/2019 1.27 (H)  08/20/2019 1.58 (H)  08/19/2019 2.01 (H)  08/18/2019 2.08 (H)  08/18/2019 2.10 (H)   ] ROS Unable to complete ROS given patient's encephalopathy.  PMH  Past Medical History:  Diagnosis Date  . Anxiety   . B12 deficiency   . Bipolar disorder ([place])   . Dementia ([place])   . Hypertension   . RBBB    PSH History reviewed. No pertinent surgical history. FH  Family History  Family history unknown: Yes   [place]  reports that she has quit smoking. She has never used smokeless tobacco. She reports previous alcohol use. She reports previous drug use. Allergies  Allergies  Allergen Reactions  . Aspirin Other (See Comments)    Unspecified  . Lisinopril Other (See Comments)   Home medications Prior to Admission medications   Medication Sig Start Date End Date Taking? Authorizing Provider  acetaminophen (TYLENOL) 500 MG tablet Take 500 mg by mouth every 4 (four) hours as needed for mild pain or fever.   Yes [provider]  alum & mag hydroxide-simeth (ALUMINA-MAGNESIA-SIMETHICONE) 200-200-20 MG/5ML suspension Take 30 mLs by mouth every 6 (six) hours as needed for indigestion or heartburn.   Yes [provider]  amLODipine (NORVASC) 10 MG tablet Take 1 tablet (10 mg total) by mouth daily. 08/25/19  Yes Allie Bossier, MD  ascorbic acid (VITAMIN C) 500 MG tablet Take 1 tablet (500 mg total) by mouth daily. 08/21/19  Yes Allie Bossier, MD  cyanocobalamin ([employer] VITAMIN B12) 1000 MCG tablet Take 1,000 mcg by mouth daily.  03/17/18  Yes [provider]  donepezil (ARICEPT) 5 MG tablet Take 5 mg  by mouth at bedtime.   Yes [provider]  ferrous sulfate 325 (65 FE) MG tablet Take 325 mg by mouth daily with breakfast.   Yes [provider]  guaiFENesin (ROBITUSSIN) 100 MG/5ML liquid Take 200 mg by mouth every 6 (six) hours as needed for cough.   Yes [provider]  Ipratropium-Albuterol (COMBIVENT) 20-100 MCG/ACT AERS respimat Inhale 1 puff into the lungs 4 (four) times daily. 08/21/19  Yes Allie Bossier, MD  loperamide (IMODIUM) 2  MG capsule Take 2 mg by mouth as needed for diarrhea or loose stools.   Yes [provider]  LORazepam (ATIVAN) 0.5 MG tablet Take 1 tablet (0.5 mg total) by mouth 2 (two) times daily as needed for anxiety. 10/12/18  Yes Ursula Alert, MD  losartan (COZAAR) 50 MG tablet Take 50 mg by mouth daily.   Yes [provider]  magnesium hydroxide (MILK OF MAGNESIA) 400 MG/5ML suspension Take 30 mLs by mouth at bedtime as needed for mild constipation.   Yes [provider]  mirtazapine (REMERON) 7.5 MG tablet Take 1 tablet (7.5 mg total) by mouth at bedtime. For sleep 10/12/18  Yes Ursula Alert, MD  neomycin-bacitracin-polymyxin (NEOSPORIN) [phone] ointment Apply 1 application topically daily as needed (skin tears).   Yes [provider]  ondansetron (ZOFRAN) 4 MG tablet Take 1 tablet (4 mg total) by mouth every 6 (six) hours as needed for nausea. 08/21/19  Yes Allie Bossier, MD  risperiDONE (RISPERDAL) 0.5 MG tablet Take 1-1.5 tablets (0.5-0.75 mg total) by mouth as directed. Take 1 tablet in the AM and 1.5 tablets PM Patient taking differently: Take 0.25-0.5 mg by mouth as directed. Take 1/2 tablet in the AM and 1 tablet PM 10/12/18 08/17/20 Yes Eappen, Ria Clock, MD  zinc sulfate 220 (50 Zn) MG capsule Take 1 capsule (220 mg total) by mouth daily. 08/21/19  Yes Allie Bossier, MD  dexamethasone (DECADRON) 6 MG tablet Take 1 tablet (6 mg total) by mouth daily. Patient not taking: Reported on 09/06/2019 08/24/19   Allie Bossier, MD    Current Medications Scheduled Meds: . ipratropium-albuterol  3 mL Inhalation QID  . vancomycin variable dose per unstable renal function (pharmacist dosing)   Does not apply See admin instructions   Continuous Infusions: . sodium chloride 100 mL/hr at 09/07/19 0545  . ceFEPime (MAXIPIME) IV    . heparin 1,200 Units/hr (09/07/19 0356)   PRN Meds:.acetaminophen **OR** acetaminophen, hydrALAZINE, ondansetron **OR** ondansetron (ZOFRAN)  IV  CBC Recent Labs  Lab 09/06/19 1212 09/06/19 1212 09/06/19 1302 09/06/19 1704 09/07/19 0236  WBC 7.6  --   --  7.4 5.6  NEUTROABS 6.1  --   --   --   --   HGB 9.8*   < > 9.2* 8.6* 8.8*  HCT 34.0*   < > 27.0* 30.1* 30.4*  MCV 106.6*  --   --  107.1* 107.0*  PLT 232  --   --  178 188   < > = values in this interval not displayed.   Basic Metabolic Panel Recent Labs  Lab 09/06/19 1212 09/06/19 1302 09/06/19 1704 09/07/19 0236  NA 162* 159*  --  159*  K 5.9* 4.6  --  4.6  CL >130*  --   --  129*  CO2 17*  --   --  17*  GLUCOSE 143*  --   --  110*  BUN 97*  --   --  87*  CREATININE 4.38*  --  3.68* 3.21*  CALCIUM 8.3*  --   --  8.2*  PHOS  --   --  5.8*  --     Physical Exam  Blood pressure 103/87, pulse 65, temperature (!) 97.4 F (36.3 C), temperature source Oral, resp. rate (!) 22, height 5\' 3"  (1.6 m), weight 68.6 kg, SpO2 96 %. GEN: Elderly, chronically ill-appearing, lying in bed with eyes open, not interactive ENT: Dry mucous membranes EYES: Sunken eyes, temporal wasting CV: Regular, normal S1 and S2 PULM: Clear bilaterally on anterior auscultation ABD: Soft, nontender SKIN: No rashes or lesions EXT: No peripheral edema Unresponsive to questions, per nursing as follows some very basic commands  Assessment 50F dementia in SNF, recent COVID19, admit with AMS, hypoxia and found to have dehydration, AoCKD3, and high prob VQ for PE.  1. AoCKD3: Suspect this is largely prerenal/hypovolemia from poor oral intake.  Potentially also worsened by use of ARB which has been held.  Appears to be improving with hydration.  Given her severe comorbidities is not a candidate for dialysis in the short or long-term.  Thankfully, currently is not indicated. Renal US pending.  2. Dehydration/hypernatremia: Slowly improving, receiving one half normal saline at 100 mL/h, continue, trend sodiums every 8 hours; goal correction would be a sodium around 150 within the next 24  hours. 3. Acute PE, hypoxia; on systemic heparin, based upon high probability VQ scan.  Per TRH 4. Alzheimer's dementia, SNF resident 5. Recent COVID-19 infection 6. Hyperkalemia, resolved 7. Question of aspiration pneumonia, on vancomycin and cefepime  Plan 1. Continue IV fluids at current rate, we will set up trending sodiums as above 2. Anticipate further improvement in GFR 3. Will check in again on her tomorrow 4. Daily weights, Daily Renal Panel, Strict I/Os, Avoid nephrotoxins (NSAIDs, judicious IV Contrast)    Rexene Agent  [phone] pgr 09/07/2019, 12:39 PM

## 2019-09-07 NOTE — Progress Notes (Signed)
PHARMACY - PHYSICIAN COMMUNICATION CRITICAL VALUE ALERT - BLOOD CULTURE IDENTIFICATION (BCID)  Leah Waller is an 85 y.o. female who presented to [facility] on 09/06/2019 with a chief complaint of encephalopathy  Assessment: 1 of 3 blood cultures positive for GPC in clusters.  No BCID run.  Name of physician (or Provider) Contacted: n/a  Current antibiotics: Vancomycin, cefepime  Changes to prescribed antibiotics recommended:  None, likely contaminant  No results found for this or any previous visit.  Marguerite Olea, [facility] Clinical Pharmacist Phone [phone]  09/07/2019 7:49 PM

## 2019-09-08 ENCOUNTER — Inpatient Hospital Stay (HOSPITAL_COMMUNITY): Payer: Medicare HMO

## 2019-09-08 LAB — CBC
HCT: 27.6 % — ABNORMAL LOW (ref 36.0–46.0)
Hemoglobin: 8.1 g/dL — ABNORMAL LOW (ref 12.0–15.0)
MCH: 30.7 pg (ref 26.0–34.0)
MCHC: 29.3 g/dL — ABNORMAL LOW (ref 30.0–36.0)
MCV: 104.5 fL — ABNORMAL HIGH (ref 80.0–100.0)
Platelets: 205 10*3/uL (ref 150–400)
RBC: 2.64 MIL/uL — ABNORMAL LOW (ref 3.87–5.11)
RDW: 14.8 % (ref 11.5–15.5)
WBC: 6.5 10*3/uL (ref 4.0–10.5)
nRBC: 0 % (ref 0.0–0.2)

## 2019-09-08 LAB — CULTURE, BLOOD (ROUTINE X 2)

## 2019-09-08 LAB — MRSA PCR SCREENING: MRSA by PCR: NEGATIVE

## 2019-09-08 LAB — HEPARIN LEVEL (UNFRACTIONATED)
Heparin Unfractionated: 0.43 IU/mL (ref 0.30–0.70)
Heparin Unfractionated: 0.44 IU/mL (ref 0.30–0.70)

## 2019-09-08 LAB — COMPREHENSIVE METABOLIC PANEL
ALT: 27 U/L (ref 0–44)
AST: 20 U/L (ref 15–41)
Albumin: 1.5 g/dL — ABNORMAL LOW (ref 3.5–5.0)
Alkaline Phosphatase: 76 U/L (ref 38–126)
Anion gap: 11 (ref 5–15)
BUN: 70 mg/dL — ABNORMAL HIGH (ref 8–23)
CO2: 15 mmol/L — ABNORMAL LOW (ref 22–32)
Calcium: 7.9 mg/dL — ABNORMAL LOW (ref 8.9–10.3)
Chloride: 128 mmol/L — ABNORMAL HIGH (ref 98–111)
Creatinine, Ser: 2.2 mg/dL — ABNORMAL HIGH (ref 0.44–1.00)
GFR calc Af Amer: 23 mL/min — ABNORMAL LOW (ref >60)
GFR calc non Af Amer: 20 mL/min — ABNORMAL LOW (ref >60)
Glucose, Bld: 80 mg/dL (ref 70–99)
Potassium: 4.3 mmol/L (ref 3.5–5.1)
Sodium: 154 mmol/L — ABNORMAL HIGH (ref 135–145)
Total Bilirubin: 1 mg/dL (ref 0.3–1.2)
Total Protein: 6 g/dL — ABNORMAL LOW (ref 6.5–8.1)

## 2019-09-08 LAB — SODIUM
Sodium: 154 mmol/L — ABNORMAL HIGH (ref 135–145)
Sodium: 152 mmol/L — ABNORMAL HIGH (ref 135–145)

## 2019-09-08 LAB — LEGIONELLA PNEUMOPHILA SEROGP 1 UR AG: L. pneumophila Serogp 1 Ur Ag: NEGATIVE

## 2019-09-08 LAB — FOLATE RBC
Folate, Hemolysate: 280 ng/mL
Folate, RBC: 1041 ng/mL (ref >498)
Hematocrit: 26.9 % — ABNORMAL LOW (ref 34.0–46.6)

## 2019-09-08 MED ORDER — VANCOMYCIN HCL 750 MG/150ML IV SOLN
750.0000 mg | INTRAVENOUS | Status: DC
  Administered 2019-09-08: 750 mg via INTRAVENOUS
  Filled 2019-09-08: qty 150

## 2019-09-08 NOTE — Progress Notes (Signed)
Occupational Therapy Evaluation Patient Details Name: Leah Waller MRN: [id number] DOB: 1934/09/24 Today's Date: 09/08/2019    History of Present Illness Pt is a 85 y.o. female with medical history significant of Alzheimer's dementia, bipolar disorder, essential hypertension, CKD stage III, brought by EMS to the emergency department due to altered mental status. Pt tested positive for COVID-19 on 2/16 and has since then been declining. Per staff, pt is no longer speaking and having difficulty breathing. Pt noted to be hypoxic and placed on 6L of O2 prior to hospitalization.    Clinical Impression   Patient resides at ALF typically and requires assistance with most ADLs.  She was at [facility] for Covid+ about 3 weeks ago where this therapist treated her.  Since then, cognition and physical ability has decline.  Patient is much less verbally responsive now and requires max cueing for following one step commands.  She required total assist x 2 to transfer to chair, whereas last month patient required mod assist of 1.  Unsure of exact level of function between [facility] visit and current visit, family contacted but not yet responded.  Will continue to follow with OT acutely to address the deficits listed below.      Follow Up Recommendations  SNF;Supervision/Assistance - 24 hour    Equipment Recommendations  None recommended by OT    Recommendations for Other Services       Precautions / Restrictions Precautions Precautions: Fall Precaution Comments: sig weakness, communication limitations, heel pressure wounds (R particularly painful) Restrictions Weight Bearing Restrictions: No      Mobility Bed Mobility Overal bed mobility: Needs Assistance Bed Mobility: Supine to Sit     Supine to sit: Total assist;+2 for physical assistance     General bed mobility comments: Total A x 2 to help lift both legs (slow with R leg due to pain with movement) and total A to keep trunk  upright  Transfers Overall transfer level: Needs assistance Equipment used: 2 person hand held assist Transfers: Squat Pivot Transfers Sit to Stand: Total assist;+2 physical assistance   Squat pivot transfers: Total assist;+2 physical assistance     General transfer comment: Trialed standing initially however pt total A x 2 to stand and unable to help maintain her balance. Thus squat pivot performed with heavy total A for trunk/ hip positioning    Balance Overall balance assessment: Needs assistance Sitting-balance support: Feet supported;Bilateral upper extremity supported Sitting balance-Leahy Scale: Zero Sitting balance - Comments: Pt unable to maintain upright positioning EOB independently. With cuing and assistance, pt is able to hold onto therapists hands to keep her trunk upright. However, therapist performing assistance to hold her up. Postural control: Posterior lean Standing balance support: Bilateral upper extremity supported Standing balance-Leahy Scale: Zero Standing balance comment: Pt is total A x 2 to stand. Unable to tolerate standing even when holding onto both therapists hands/arms.                           ADL either performed or assessed with clinical judgement   ADL Overall ADL's : Needs assistance/impaired                                     Functional mobility during ADLs: Total assistance;+2 for physical assistance General ADL Comments: Patient max/total with all ADLs right now.      Vision  Perception     Praxis      Pertinent Vitals/Pain Pain Assessment: Faces Faces Pain Scale: Hurts whole lot Pain Location: R heel Pain Descriptors / Indicators: Discomfort;Grimacing;Guarding Pain Intervention(s): Limited activity within patient's tolerance;Monitored during session;Repositioned;Other (comment)(RN notified)     Hand Dominance Right   Extremity/Trunk Assessment Upper Extremity Assessment Upper Extremity  Assessment: Generalized weakness   Lower Extremity Assessment Lower Extremity Assessment: Generalized weakness   Cervical / Trunk Assessment Cervical / Trunk Assessment: Other exceptions(unable to hold her trunk up independently)   Communication Communication Communication: Expressive difficulties;Other (comment)(Patient has slow processing, does not consistently respond)   Cognition Arousal/Alertness: Awake/alert Behavior During Therapy: Flat affect Overall Cognitive Status: History of cognitive impairments - at baseline                                 General Comments: Slow processing, sequencing, initiation.  Not oriented.  Difficulty following commands and responding.  This therapist had seen her about 1 month ago at [facility] and cognition has declined since then.   General Comments  Bilat heel pressure wounds.  Therapist removed watch from left wrist and place on tray table as arms seemed to have slight edema and watch cause indentation    Exercises     Shoulder Instructions      Home Living Family/patient expects to be discharged to:: Assisted living Living Arrangements: Alone Available Help at Discharge: Family;Available PRN/intermittently                         Home Equipment: None(TBD- patient poor historian)   Additional Comments: Pt is poor historian. RN reports she lives at an ALF. PT has left voicemail with family to determine PLOF      Prior Functioning/Environment Level of Independence: Needs assistance  Gait / Transfers Assistance Needed: unable to determine due to poor historian. Follow up with family (waiting on return call) ADL's / Homemaking Assistance Needed: Previously requiring assistance with all ADLs due to cogn., waiting on more specifics from family Communication / Swallowing Assistance Needed: NPO Comments: Previous PT eval, pt reported staff helps her with showers/dressing/eating.        OT Problem List: Decreased  strength;Decreased activity tolerance;Impaired balance (sitting and/or standing);Decreased cognition;Decreased safety awareness;Decreased knowledge of precautions;Cardiopulmonary status limiting activity      OT Treatment/Interventions: Self-care/ADL training;Therapeutic exercise;Therapeutic activities;Cognitive remediation/compensation;Patient/family education;Balance training    OT Goals(Current goals can be found in the care plan section) Acute Rehab OT Goals Patient Stated Goal: Unable to state OT Goal Formulation: Patient unable to participate in goal setting Time For Goal Achievement: 09/22/19 Potential to Achieve Goals: Fair  OT Frequency: Min 2X/week   Barriers to D/C:            Co-evaluation PT/OT/SLP Co-Evaluation/Treatment: Yes Reason for Co-Treatment: Necessary to address cognition/behavior during functional activity;For patient/therapist safety;To address functional/ADL transfers PT goals addressed during session: Mobility/safety with mobility OT goals addressed during session: ADL's and self-care      AM-PAC OT "6 Clicks" Daily Activity     Outcome Measure Help from another person eating meals?: A Lot Help from another person taking care of personal grooming?: Total Help from another person toileting, which includes using toliet, bedpan, or urinal?: Total Help from another person bathing (including washing, rinsing, drying)?: Total Help from another person to put on and taking off regular upper body clothing?: Total Help from another person to  put on and taking off regular lower body clothing?: Total 6 Click Score: 7   End of Session Equipment Utilized During Treatment: Gait belt Nurse Communication: Mobility status;Need for lift equipment  Activity Tolerance: Patient limited by fatigue;Other (comment)(Limited by poor cognition and responsiveness) Patient left: in chair;with call bell/phone within reach;with chair alarm set  OT Visit Diagnosis: Unsteadiness on  feet (R26.81);Muscle weakness (generalized) (M62.81);History of falling (Z91.81);Other symptoms and signs involving cognitive function;Pain Pain - Right/Left: Right Pain - part of body: Ankle and joints of foot                Time: 6384-5364 OT Time Calculation (min): 38 min Charges:  OT General Charges $OT Visit: 1 Visit OT Evaluation $OT Eval Moderate Complexity: 1 [address] OTR/L   Phylliss Bob 09/08/2019, 2:06 PM

## 2019-09-08 NOTE — Consult Note (Signed)
   [facility] CM Inpatient Consult   09/08/2019  Leah Waller [facility] January 05, 1935 [id number]   Patient screened for extreme high risk score for unplanned readmission score and for less than 30 days readmission hospitalization.  Review of patient's medical record reveals patient is being recommended for a skilled nursing facility stay/return.  Patient was at [facility].  Patient with [facility] in [place] however, her primary care provider is listed as:  Primary Care Provider is  [facility] which is not in the [employer].  Plan: Will sign off as no current [facility] PCP and for a SNF stay.  For questions contact:   Natividad Brood, RN BSN [facility] Liaison  [phone] business mobile phone Toll free office [phone]  Fax number: [phone] [name]

## 2019-09-08 NOTE — Progress Notes (Signed)
Modified Barium Swallow Progress Note  Patient Details  Name: Leah Waller MRN: [id number] Date of Birth: Jul 22, 1934  Today's Date: 09/08/2019  Modified Barium Swallow completed.  Full report located under Chart Review in the Imaging Section.  Brief recommendations include the following:  Clinical Impression  Pt with history of dementia seen for MBS after treatment session concerning for aspiration. Moderate-severe oral and moderate pharyngeal dysphagia marked by inadequate laryngeal elevation, pharyngeal contraction and tongue base retraction. Puree bolus held in oral cavity 3 minutes despite lingual pressure with dry spoon/additional volume presentations and ultimately required suctioning for first trial. Strength of swallow was reduced in particular her laryngeal elevation, pharyngeal contraction resulting in vallecular residue and laryngeal penetration. Barium adhered to pharyngeal wall and fell into vestibule, close to vocal cords. She sensed with slight throat clear and/or second swallow that only inconsistently removed penetrates. If pt consumed po's over the course of a meal the likelihood of aspiration is high. Given her dementia and age, long term alternative nutrition would not be in her best interests. Family may wish to pursue comfort feeds in which case Dys 1 (puree) and honey thick liquids. MD notified of recommendations and to speak with family. Continue NPO status for now.      Swallow Evaluation Recommendations       SLP Diet Recommendations: NPO                       Oral Care Recommendations: Oral care QID        Houston Siren 09/08/2019,3:52 PM   Orbie Pyo [place].Ed Risk analyst [phone] Office [phone] '

## 2019-09-08 NOTE — Plan of Care (Signed)

## 2019-09-08 NOTE — TOC Initial Note (Addendum)
Transition of Care [facility]) - Initial/Assessment Note    Patient Details  Name: Leah Waller MRN: [id number] Date of Birth: 02-20-1935  Transition of Care [facility]) CM/SW Contact:    Maryclare Labrador, RN Phone Number: 09/08/2019, 3:11 PM  Clinical Narrative:  PTA from [facility].  Prior to that pt resided in ALF.  Pt recently discharged from [facility] with COVID.  Pt is not oriented - CM reached out to her daughter [name].  [name] informed CM that she is the pts legal guardian.  [name] expect pt to return to [facility] at discharge.  CM contacted Olivia Mackie with [facility] and facility will pursue auth once pt is stable for discharge back.  .  Pt has active PASRR  [id number] F - expires 09/22/19 (no new mental health dx since last admit).                  FL2 completed and pt faxed out to [facility]  Expected Discharge Plan: [place] Barriers to Discharge: Continued Medical Work up   Patient Goals and CMS Choice Patient states their goals for this hospitalization and ongoing recovery are:: patient unable to participate in goal setting due to disorientation   Choice offered to / list presented to : Adult Children([name])  Expected Discharge Plan and Services Expected Discharge Plan: [place] arrangements for the past 2 months: Skilled Nursing Facility(Pt discharged from [facility] to [facility] in late Feb 2021, prior to that she was at an ALF)                                      Prior Living Arrangements/Services Living arrangements for the past 2 months: Skilled Nursing Facility(Pt discharged from [facility] to [facility] in late Feb 2021, prior to that she was at an ALF) Lives with:: Facility Resident Patient language and need for interpreter reviewed:: Yes        Need for Family Participation in Patient Care: Yes (Comment) Care giver support system in place?: Yes (comment)   Criminal Activity/Legal Involvement Pertinent to Current  Situation/Hospitalization: No - Comment as needed  Activities of Daily Living      Permission Sought/Granted   Permission granted to share information with : Yes, Verbal Permission Granted              Emotional Assessment       Orientation: : Fluctuating Orientation (Suspected and/or reported [place])   Psych Involvement: No (comment)  Admission diagnosis:  Hyperkalemia [E87.5] Hypernatremia [E87.0] AKI (acute kidney injury) ([place]) [N17.9] Sepsis ([place]) [A41.9] Altered mental status, unspecified altered mental status type [R41.82] Sepsis with acute hypoxic respiratory failure without septic shock, due to unspecified organism ([place]) [A41.9, R65.20, J96.01] Patient Active Problem List   Diagnosis Date Noted  . Sepsis ([place]) 09/06/2019  . Hyperchloremia 09/06/2019  . Hyperkalemia 09/06/2019  . Acute hypoxemic respiratory failure ([place]) 09/06/2019  . Dementia without behavioral disturbance ([place]) 08/18/2019  . Bipolar 1 disorder ([place]) 08/18/2019  . COVID-19 virus infection 08/17/2019  . Acute metabolic encephalopathy 55/73/2202  . Frequent falls 08/17/2019  . Depression with anxiety 08/17/2019  . Iron deficiency anemia 08/17/2019  . Acute renal failure superimposed on stage 3a chronic kidney disease ([place]) 08/17/2019  . Acute respiratory disease due to COVID-19 virus 08/17/2019  . Pneumonia due to COVID-19 virus 08/17/2019  . Dehydration with hypernatremia 08/17/2019  .  Acute cystitis 08/17/2019  . DNR (do not resuscitate) 08/17/2019  . Memory deficit 04/14/2018  . Essential hypertension 04/14/2018  . Hyperlipidemia 04/14/2018  . Anxiety 04/14/2018  . Alzheimer's dementia with behavioral disturbance ([place]) 12/22/2017  . Low vitamin B12 level 10/11/2014   PCP:  [facility]:   Ardeth Perfect, [place] [address] [place] [address] Phone: [phone] Fax: [phone]     Social  Determinants of Health (SDOH) Interventions    Readmission Risk Interventions No flowsheet data found.

## 2019-09-08 NOTE — Progress Notes (Signed)
PROGRESS NOTE        PATIENT DETAILS Name: Leah Waller Age: 85 y.o. Sex: female Date of Birth: Oct 03, 1934 Admit Date: 09/06/2019 Admitting Physician Mckinley Jewel, MD ACZ:[id number], [facility]  Brief Narrative: Patient is a 85 y.o. female with past medical history of dementia, HTN, CKD stage III-recent history of COVID-19 infection requiring hospitalization-presenting to the hospital with acute metabolic encephalopathy in the setting of AKI, hyponatremia and possible aspiration pneumonia.  Upon further imaging in the emergency room-likely thought to have pulmonary embolism as well.  See below for further details.  Significant events: 2/17-2/23>> admit to [facility] for [id number] pneumonia-complicated by acute metabolic encephalopathy and bradycardia. 3/8>> admit to [facility] for encephalopathy due to AKI, hypernatremia, aspiration pneumonia and possible PE. 3/8>> VQ scan- high probability for PE-started on IV heparin  Antimicrobial therapy: Cefepime: 3/8>> Vancomycin: 3/8>> 3/10.  Microbiology data: 3/8>> blood culture: 1/2+ for coag negative staph (likely contaminant) 3/8>> urine culture: 1000 colonies of Enterococcus (suspect contaminant)  Procedures : None  Consults: None  DVT Prophylaxis : Full dose anticoagulation with Heparin  Subjective: Much better today-not pulling any secretions.  Awake and pleasantly confused.  Assessment/Plan: Acute metabolic encephalopathy: Secondary to hypernatremia, AKI and probable aspiration pneumonia.  Improved-more awake/alert compared to yesterday.  Continue with supportive care.  CT head without any acute findings on admission.  AKI a stage IIIa: Likely hemodynamically mediated-in the setting of poor oral intake-sepsis-and use of losartan.UA without significant proteinuria.  AKI improving-continue supportive care.  Renal ultrasound without hydronephrosis.    Hypernatremia: Secondary to poor oral  intake-improving with with half-normal saline.  Follow.  Acute hypoxemic respiratory failure: Suspect secondary to pulmonary embolism and likely aspiration pneumonia.  Improved-on room air.  Vancomycin-continue cefepime.    Pulmonary embolism: Likely provoked by recent Covid infection-sedentary lifestyle.  VQ scan on 3/8-with high probability.  Continue IV heparin for a few more days till oral intake stable..  Lower extremity Dopplers negative for DVT.  Sepsis likely secondary to aspiration pneumonia: Sepsis physiology has resolved-cultures as above-on IV cefepime.  Stop vancomycin.    Dysphagia: Was pooling secretions on admission-much better today.  SLP eval in progress.  Remains NPO.  Recent COVID-19 pneumonia with resultant debility/deconditioning and failure to thrive syndrome: Supportive care-I have ordered PT/OT and SLP eval.  Does not have active Covid pneumonitis at this time.  Chronic combined systolic and diastolic heart failure (EF 45-50% by TTE on 08/20/2019): Volume status stable-follow closely while on IVF.  HTN: BP currently controlled-amlodipine, losartan on hold.  Dementia: Currently with encephalopathy-but at risk of delirium.  Will resume risperidone, Remeron  Large right renal cyst: Incidental finding on renal ultrasound-stable for outpatient follow-up.  Goals of care: Given patient's overall frailty-dementia-multiple chronic comorbidities as outlined above-now acutely ill with AKI, hypernatremia, aspiration pneumonia and PE-she remains at risk for decompensation and further deterioration.  Patient's daughter is aware of the tenuous overall clinical situation-I have recommended that given overall poor prognosis-we make her a full DNR (golden yellow rod/MOST form at present bedside).  Plan is to continue treatment of the acute issues to see if she recovers-if she deteriorates-daughter is aware that we may need to transition to hospice care.   Diet: Diet Order             Diet NPO time specified  Diet effective now  Code Status:  DNR  Family Communication: Spoke with daughter over phone 3/10  Disposition Plan: SNF when ready for discharge  Barriers to Discharge: Sepsis secondary to aspiration pneumonia-on IV antibiotics, severe AKI and hypernatremia.  Antimicrobial agents: Anti-infectives (From admission, onward)   Start     Dose/Rate Route Frequency Ordered Stop   09/08/19 1400  vancomycin (VANCOREADY) IVPB 750 mg/150 mL     750 mg 150 mL/hr over 60 Minutes Intravenous Every 48 hours 09/08/19 1127     09/07/19 1200  ceFEPIme (MAXIPIME) 2 g in sodium chloride 0.9 % 100 mL IVPB     2 g 200 mL/hr over 30 Minutes Intravenous Every 24 hours 09/06/19 1410     09/06/19 1745  ceFEPIme (MAXIPIME) 2 g in sodium chloride 0.9 % 100 mL IVPB  Status:  Discontinued     2 g 200 mL/hr over 30 Minutes Intravenous  Once 09/06/19 1730 09/06/19 1747   09/06/19 1745  vancomycin (VANCOREADY) IVPB 1500 mg/300 mL  Status:  Discontinued     1,500 mg 150 mL/hr over 120 Minutes Intravenous  Once 09/06/19 1730 09/06/19 1747   09/06/19 1410  vancomycin variable dose per unstable renal function (pharmacist dosing)  Status:  Discontinued      Does not apply See admin instructions 09/06/19 1410 09/08/19 1127   09/06/19 1300  vancomycin (VANCOREADY) IVPB 1500 mg/300 mL     1,500 mg 150 mL/hr over 120 Minutes Intravenous  Once 09/06/19 1254 09/06/19 1617   09/06/19 1215  ceFEPIme (MAXIPIME) 2 g in sodium chloride 0.9 % 100 mL IVPB     2 g 200 mL/hr over 30 Minutes Intravenous  Once 09/06/19 1214 09/06/19 1409   09/06/19 1215  metroNIDAZOLE (FLAGYL) IVPB 500 mg     500 mg 100 mL/hr over 60 Minutes Intravenous  Once 09/06/19 1214 09/06/19 1459   09/06/19 1215  vancomycin (VANCOCIN) IVPB 1000 mg/200 mL premix  Status:  Discontinued     1,000 mg 200 mL/hr over 60 Minutes Intravenous  Once 09/06/19 1214 09/06/19 1254       Time spent: 35 minutes-Greater  than 50% of this time was spent in counseling, explanation of diagnosis, planning of further management, and coordination of care.  MEDICATIONS: Scheduled Meds:  Continuous Infusions: . sodium chloride 100 mL/hr at 09/08/19 0347  . ceFEPime (MAXIPIME) IV 2 g (09/07/19 1349)  . heparin 1,100 Units/hr (09/07/19 1719)  . vancomycin     PRN Meds:.acetaminophen **OR** acetaminophen, hydrALAZINE, ipratropium-albuterol, ondansetron **OR** ondansetron (ZOFRAN) IV   PHYSICAL EXAM: Vital signs: Vitals:   09/07/19 1923 09/08/19 0308 09/08/19 0735 09/08/19 1117  BP:   (!) 135/44 (!) 136/56  Pulse:   74 65  Resp:   (!) 27 (!) 21  Temp: 98.2 F (36.8 C) 98.2 F (36.8 C) (!) 97.4 F (36.3 C) 98 F (36.7 C)  TempSrc: Oral Oral Axillary Axillary  SpO2:   96% 100%  Weight:      Height:       Filed Weights   09/06/19 1812  Weight: 68.6 kg   Body mass index is 26.79 kg/m.   Gen Exam: Pleasantly confused-not in any distress HEENT:atraumatic, normocephalic Chest: B/L clear to auscultation anteriorly CVS:S1S2 regular Abdomen:soft non tender, non distended Extremities:no edema Neurology: Generalized weakness-but moves all 4 extremities Skin: no rash  I have personally reviewed following labs and imaging studies  LABORATORY DATA: CBC: Recent Labs  Lab 09/06/19 1212 09/06/19 1302 09/06/19 1704 09/07/19 0236 09/07/19 2326  WBC  7.6  --  7.4 5.6 6.5  NEUTROABS 6.1  --   --   --   --   HGB 9.8* 9.2* 8.6* 8.8* 8.1*  HCT 34.0* 27.0* 30.1* 30.4* 27.6*  MCV 106.6*  --  107.1* 107.0* 104.5*  PLT 232  --  178 188 188    Basic Metabolic Panel: Recent Labs  Lab 09/06/19 1212 09/06/19 1212 09/06/19 1302 09/06/19 1704 09/07/19 0236 09/07/19 1538 09/07/19 2326 09/08/19 0817  NA 162*   < > 159*  --  159* 155* 154* 154*  K 5.9*  --  4.6  --  4.6  --   --  4.3  CL >130*  --   --   --  129*  --   --  128*  CO2 17*  --   --   --  17*  --   --  15*  GLUCOSE 143*  --   --   --  110*   --   --  80  BUN 97*  --   --   --  87*  --   --  70*  CREATININE 4.38*  --   --  3.68* 3.21*  --   --  2.20*  CALCIUM 8.3*  --   --   --  8.2*  --   --  7.9*  MG  --   --   --  2.9*  --   --   --   --   PHOS  --   --   --  5.8*  --   --   --   --    < > = values in this interval not displayed.    GFR: Estimated Creatinine Clearance: 18 mL/min (A) (by C-G formula based on SCr of 2.2 mg/dL (H)).  Liver Function Tests: Recent Labs  Lab 09/06/19 1212 09/07/19 0236 09/08/19 0817  AST 54* 35 20  ALT 50* 40 27  ALKPHOS 110 84 76  BILITOT 1.8* 1.4* 1.0  PROT 7.4 6.3* 6.0*  ALBUMIN 2.0* 1.8* 1.5*   No results for input(s): LIPASE, AMYLASE in the last 168 hours. No results for input(s): AMMONIA in the last 168 hours.  Coagulation Profile: Recent Labs  Lab 09/06/19 1212  INR 1.4*    Cardiac Enzymes: No results for input(s): CKTOTAL, CKMB, CKMBINDEX, TROPONINI in the last 168 hours.  BNP (last 3 results) No results for input(s): PROBNP in the last 8760 hours.  Lipid Profile: No results for input(s): CHOL, HDL, LDLCALC, TRIG, CHOLHDL, LDLDIRECT in the last 72 hours.  Thyroid Function Tests: No results for input(s): TSH, T4TOTAL, FREET4, T3FREE, THYROIDAB in the last 72 hours.  Anemia Panel: Recent Labs    09/07/19 0232  VITAMINB12 1,417*    Urine analysis:    Component Value Date/Time   COLORURINE AMBER (A) 09/06/2019 2204   APPEARANCEUR CLOUDY (A) 09/06/2019 2204   LABSPEC 1.019 09/06/2019 2204   PHURINE 5.0 09/06/2019 2204   GLUCOSEU NEGATIVE 09/06/2019 2204   HGBUR SMALL (A) 09/06/2019 2204   BILIRUBINUR NEGATIVE 09/06/2019 [place] 09/06/2019 2204   PROTEINUR 30 (A) 09/06/2019 2204   NITRITE NEGATIVE 09/06/2019 2204   LEUKOCYTESUR SMALL (A) 09/06/2019 2204    Sepsis Labs: Lactic Acid, Venous    Component Value Date/Time   LATICACIDVEN 1.4 09/06/2019 1212    MICROBIOLOGY: Recent Results (from the past 240 hour(s))  Blood Culture  (routine x 2)     Status: None (Preliminary result)  Collection Time: 09/06/19 12:55 PM   Specimen: BLOOD LEFT HAND  Result Value Ref Range Status   Specimen Description BLOOD LEFT HAND  Final   Special Requests   Final    BOTTLES DRAWN AEROBIC ONLY Blood Culture results may not be optimal due to an excessive volume of blood received in culture bottles   Culture   Final    NO GROWTH 2 DAYS Performed at [facility], [place] [address], [place], [place] [address]    Report Status PENDING  Incomplete  Blood Culture (routine x 2)     Status: Abnormal   Collection Time: 09/06/19  1:05 PM   Specimen: BLOOD  Result Value Ref Range Status   Specimen Description BLOOD RIGHT ANTECUBITAL  Final   Special Requests   Final    BOTTLES DRAWN AEROBIC AND ANAEROBIC Blood Culture results may not be optimal due to an excessive volume of blood received in culture bottles   Culture  Setup Time   Final    AEROBIC BOTTLE ONLY GRAM POSITIVE COCCI IN CLUSTERS CRITICAL RESULT CALLED TO, READ BACK BY AND VERIFIED WITH: J CARNEY PHARMD 09/07/19 1721 JDW    Culture (A)  Final    STAPHYLOCOCCUS SPECIES (COAGULASE NEGATIVE) THE SIGNIFICANCE OF ISOLATING THIS ORGANISM FROM A SINGLE SET OF BLOOD CULTURES WHEN MULTIPLE SETS ARE DRAWN IS UNCERTAIN. PLEASE NOTIFY THE MICROBIOLOGY DEPARTMENT WITHIN ONE WEEK IF SPECIATION AND SENSITIVITIES ARE REQUIRED. Performed at [facility], [place] [address] [place], [place] [address]    Report Status 09/08/2019 FINAL  Final  Urine culture     Status: Abnormal (Preliminary result)   Collection Time: 09/06/19 10:04 PM   Specimen: In/Out Cath Urine  Result Value Ref Range Status   Specimen Description IN/OUT CATH URINE  Final   Special Requests NONE  Final   Culture (A)  Final    1,000 COLONIES/mL ENTEROCOCCUS FAECALIS SUSCEPTIBILITIES TO FOLLOW Performed at [facility], [place] [address] [place], [place] [address]    Report Status PENDING  Incomplete     RADIOLOGY STUDIES/RESULTS: CT Head Wo Contrast  Result Date: 09/06/2019 CLINICAL DATA:  Posttraumatic headache. EXAM: CT HEAD WITHOUT CONTRAST CT CERVICAL SPINE WITHOUT CONTRAST TECHNIQUE: Multidetector CT imaging of the head and cervical spine was performed following the standard protocol without intravenous contrast. Multiplanar CT image reconstructions of the cervical spine were also generated. COMPARISON:  August 17, 2019. FINDINGS: CT HEAD FINDINGS Brain: Mild chronic ischemic white matter disease is noted. No mass effect or midline shift is noted. Ventricular size is within normal limits. There is no evidence of mass lesion, hemorrhage or acute infarction. Vascular: No hyperdense vessel or unexpected calcification. Skull: Normal. Negative for fracture or focal lesion. Sinuses/Orbits: No acute finding. Other: None. CT CERVICAL SPINE FINDINGS Alignment: Mild grade 1 anterolisthesis of C4-5 is noted secondary to posterior facet joint hypertrophy. Skull base and vertebrae: No acute fracture. No primary bone lesion or focal pathologic process. Soft tissues and spinal canal: No prevertebral fluid or swelling. No visible canal hematoma. Disc levels:  Mild degenerative disc disease is noted at C4-5. Upper chest: Negative. Other: Degenerative changes are seen involving posterior facet joints bilaterally. IMPRESSION: 1. Mild chronic ischemic white matter disease. No acute intracranial abnormality seen. 2. Multilevel degenerative disc disease. No acute abnormality seen in the cervical spine. Electronically Signed   By: Marijo Conception M.D.   On: 09/06/2019 15:30   CT Cervical Spine Wo Contrast  Result Date: 09/06/2019  CLINICAL DATA:  Posttraumatic headache. EXAM: CT HEAD WITHOUT CONTRAST CT CERVICAL SPINE WITHOUT CONTRAST TECHNIQUE: Multidetector CT imaging of the head and cervical spine was performed following the standard protocol without intravenous contrast. Multiplanar CT image reconstructions of the  cervical spine were also generated. COMPARISON:  August 17, 2019. FINDINGS: CT HEAD FINDINGS Brain: Mild chronic ischemic white matter disease is noted. No mass effect or midline shift is noted. Ventricular size is within normal limits. There is no evidence of mass lesion, hemorrhage or acute infarction. Vascular: No hyperdense vessel or unexpected calcification. Skull: Normal. Negative for fracture or focal lesion. Sinuses/Orbits: No acute finding. Other: None. CT CERVICAL SPINE FINDINGS Alignment: Mild grade 1 anterolisthesis of C4-5 is noted secondary to posterior facet joint hypertrophy. Skull base and vertebrae: No acute fracture. No primary bone lesion or focal pathologic process. Soft tissues and spinal canal: No prevertebral fluid or swelling. No visible canal hematoma. Disc levels:  Mild degenerative disc disease is noted at C4-5. Upper chest: Negative. Other: Degenerative changes are seen involving posterior facet joints bilaterally. IMPRESSION: 1. Mild chronic ischemic white matter disease. No acute intracranial abnormality seen. 2. Multilevel degenerative disc disease. No acute abnormality seen in the cervical spine. Electronically Signed   By: Marijo Conception M.D.   On: 09/06/2019 15:30   NM Pulmonary Perfusion  Addendum Date: 09/07/2019   ADDENDUM REPORT: 09/07/2019 00:34 ADDENDUM: I discussed critical Value/emergent results were by telephone at the time of interpretation on 09/07/2019 at 21:50 pm to provider Dr. Laverta Baltimore, who verbally acknowledged these results. Electronically Signed   By: Revonda Humphrey   On: 09/07/2019 00:34   Result Date: 09/07/2019 CLINICAL DATA:  Dyspnea and concern for pulmonary embolism. EXAM: NUCLEAR MEDICINE PERFUSION LUNG SCAN TECHNIQUE: Perfusion images were obtained in multiple projections after intravenous injection of radiopharmaceutical. Ventilation scans intentionally deferred if perfusion scan and chest x-ray adequate for interpretation during COVID-19 epidemic. A  technical note states the patient is nonresponsive and lateral scintigraphic images were not acquired. RADIOPHARMACEUTICALS:  1.56 mCi Tc-35m MAA IV COMPARISON:  September 11, 2005. Chest radiography performed earlier on the same date. FINDINGS: There is a large left basilar wedge-shaped perfusion defect that is larger than the area of patchy left medial basilar consolidation on radiography, the perfusion-radiograph mismatch equal to at least 2 pulmonary segments. Ventilation images were deferred. A stat PRA communication was initiated. IMPRESSION: Nuclear medicine perfusion imaging with high probability of pulmonary embolism according to the modified PIOPED II criteria. Electronically Signed: By: Revonda Humphrey On: 09/06/2019 21:45   US RENAL  Result Date: 09/07/2019 CLINICAL DATA:  Acute renal injury EXAM: RENAL / URINARY TRACT ULTRASOUND COMPLETE COMPARISON:  None. FINDINGS: Right Kidney: Renal measurements: 15.4 x 5.9 x 6.9 cm = volume: 330 mL. Two large anechoic cysts measuring 6.5 and 5.0 cm. Left Kidney: Renal measurements: 8.4 x 4.4 x 4.3 cm = volume: 83 mL. Thickened cortex. No hydronephrosis. No hydronephrosis Bladder: Appears normal for degree of bladder distention. Other: Gallbladder sludge IMPRESSION: 1. No renal obstruction.  No hydronephrosis. 2. Large benign appearing renal cyst of the RIGHT kidney. 3. Asymmetric renal size with the LEFT kidney is smaller than the RIGHT. Electronically Signed   By: Suzy Bouchard M.D.   On: 09/07/2019 14:59   DG Chest Port 1 View  Result Date: 09/06/2019 CLINICAL DATA:  Altered mental status, shortness of breath, COVID positive. EXAM: PORTABLE CHEST 1 VIEW COMPARISON:  Chest radiograph 08/17/2019 FINDINGS: Stable cardiomediastinal contours with mildly enlarged heart size.  Low volumes. Mild heterogeneous opacities at the left base could reflect atelectasis versus infiltrates. The right lung is clear. Coarsening of the interstitium bilaterally likely chronic  bronchitic change. No pneumothorax or large pleural effusion. No acute finding in the visualized skeleton. IMPRESSION: Mild heterogeneous opacities at the left base could reflect atelectasis versus infiltrates. Electronically Signed   By: Audie Pinto M.D.   On: 09/06/2019 13:08   VAS [place] LOWER EXTREMITY VENOUS (DVT)  Result Date: 09/07/2019  Lower Venous DVTStudy Indications: Swelling, and SOB.  Limitations: Pt position and ability to cooperate. Performing Technologist: Antonieta Pert RDMS, RVT  Examination Guidelines: A complete evaluation includes B-mode imaging, spectral Doppler, color Doppler, and power Doppler as needed of all accessible portions of each vessel. Bilateral testing is considered an integral part of a complete examination. Limited examinations for reoccurring indications may be performed as noted. The reflux portion of the exam is performed with the patient in reverse Trendelenburg.  +---------+---------------+---------+-----------+----------+-------------------+ RIGHT    CompressibilityPhasicitySpontaneityPropertiesThrombus Aging      +---------+---------------+---------+-----------+----------+-------------------+ CFV      Full           Yes      Yes                                      +---------+---------------+---------+-----------+----------+-------------------+ SFJ      Full                                                             +---------+---------------+---------+-----------+----------+-------------------+ FV Prox  Full                                                             +---------+---------------+---------+-----------+----------+-------------------+ FV Mid   Full                                                             +---------+---------------+---------+-----------+----------+-------------------+ FV Distal                                             pt can not tolerate                                                        compression         +---------+---------------+---------+-----------+----------+-------------------+ PFV      Full                                                             +---------+---------------+---------+-----------+----------+-------------------+  POP                     Yes      Yes                  pt can not tolerate                                                       compression         +---------+---------------+---------+-----------+----------+-------------------+ PTV      Full                                                             +---------+---------------+---------+-----------+----------+-------------------+ PERO     Full                                                             +---------+---------------+---------+-----------+----------+-------------------+ GSV      Full                                                             +---------+---------------+---------+-----------+----------+-------------------+   +---------+---------------+---------+-----------+----------+--------------+ LEFT     CompressibilityPhasicitySpontaneityPropertiesThrombus Aging +---------+---------------+---------+-----------+----------+--------------+ CFV      Full           Yes      Yes                                 +---------+---------------+---------+-----------+----------+--------------+ SFJ      Full                                                        +---------+---------------+---------+-----------+----------+--------------+ FV Prox  Full                                                        +---------+---------------+---------+-----------+----------+--------------+ FV Mid   Full                                                        +---------+---------------+---------+-----------+----------+--------------+ FV DistalFull                                                         +---------+---------------+---------+-----------+----------+--------------+  PFV      Full                                                        +---------+---------------+---------+-----------+----------+--------------+ POP      Full           Yes      Yes                                 +---------+---------------+---------+-----------+----------+--------------+ PTV      Full                                                        +---------+---------------+---------+-----------+----------+--------------+ PERO     Full                                                        +---------+---------------+---------+-----------+----------+--------------+ GSV      Full                                                        +---------+---------------+---------+-----------+----------+--------------+     Summary: RIGHT: - There is no evidence of deep vein thrombosis in the lower extremity. However, portions of this examination were limited- see technologist comments above.  - No cystic structure found in the popliteal fossa.  LEFT: - There is no evidence of deep vein thrombosis in the lower extremity.  - No cystic structure found in the popliteal fossa.  *See table(s) above for measurements and observations. Electronically signed by Harold Barban MD on 09/07/2019 at 5:48:31 PM.    Final      LOS: 2 days   Oren Binet, MD  [facility]    To contact the attending provider between 7A-7P or the covering provider during after hours 7P-7A, please log into the web site [email or web address] and access using universal [place] password for that web site. If you do not have the password, please call the hospital operator.  09/08/2019, 11:58 AM

## 2019-09-08 NOTE — NC FL2 (Signed)
[place] LEVEL OF CARE SCREENING TOOL     IDENTIFICATION  Patient Name: Leah Waller Birthdate: Jul 27, 1934 Sex: female Admission Date (Current Location): 09/06/2019  [facility] and [place] Number:  Herbalist and Address:  [facility]. [facility], [place] [address] [place], [place] [address]      Provider Number: [id number]  Attending Physician Name and Address:  Jonetta Osgood, MD  Relative Name and Phone Number:  Daughter Marinus Maw [phone]    Current Level of Care: Other (Comment) Recommended Level of Care: [place] Prior Approval Number:    Date Approved/Denied:   PASRR Number: [id number] F expires 09/22/19(Expires 09/22/19)  Discharge Plan: SNF    Current Diagnoses: Patient Active Problem List   Diagnosis Date Noted  . Sepsis ([place]) 09/06/2019  . Hyperchloremia 09/06/2019  . Hyperkalemia 09/06/2019  . Acute hypoxemic respiratory failure ([place]) 09/06/2019  . Dementia without behavioral disturbance ([place]) 08/18/2019  . Bipolar 1 disorder ([place]) 08/18/2019  . COVID-19 virus infection 08/17/2019  . Acute metabolic encephalopathy 82/51/8984  . Frequent falls 08/17/2019  . Depression with anxiety 08/17/2019  . Iron deficiency anemia 08/17/2019  . Acute renal failure superimposed on stage 3a chronic kidney disease ([place]) 08/17/2019  . Acute respiratory disease due to COVID-19 virus 08/17/2019  . Pneumonia due to COVID-19 virus 08/17/2019  . Dehydration with hypernatremia 08/17/2019  . Acute cystitis 08/17/2019  . DNR (do not resuscitate) 08/17/2019  . Memory deficit 04/14/2018  . Essential hypertension 04/14/2018  . Hyperlipidemia 04/14/2018  . Anxiety 04/14/2018  . Alzheimer's dementia with behavioral disturbance ([place]) 12/22/2017  . Low vitamin B12 level 10/11/2014    Orientation RESPIRATION BLADDER Height & Weight     Self  Normal Incontinent Weight: 68.6 kg Height:  5\' 3"  (160 cm)  BEHAVIORAL  SYMPTOMS/MOOD NEUROLOGICAL BOWEL NUTRITION STATUS      Incontinent (Diet:  See DC summary)  AMBULATORY STATUS COMMUNICATION OF NEEDS Skin   Extensive Assist Verbally PU Stage and Appropriate Care(Left Heel Deep Tissue Pressure Injury)                       Personal Care Assistance Level of Assistance  Bathing, Feeding, Dressing Bathing Assistance: Maximum assistance Feeding assistance: Maximum assistance Dressing Assistance: Maximum assistance     Functional Limitations Info  Sight Sight Info: Impaired        SPECIAL CARE FACTORS FREQUENCY  PT (By licensed PT), OT (By licensed OT)     PT Frequency: PT x 5 OT Frequency: OT x 5            Contractures Contractures Info: Not present(none documented)    Additional Factors Info  Code Status, Psychotropic, Allergies, Insulin Sliding Scale Code Status Info: DNR Allergies Info: Aspirin, Lisinopril Psychotropic Info: Aricept 5mg  daily, Remeron 7.5mg  daily at bed         Current Medications (09/08/2019):  This is the current hospital active medication list Current Facility-Administered Medications  Medication Dose Route Frequency Provider Last Rate Last Admin  . 0.45 % sodium chloride infusion   Intravenous Continuous Pahwani, Rinka R, MD 100 mL/hr at 09/08/19 0347 Bolus from Bag at 09/08/19 0347  . acetaminophen (TYLENOL) tablet 650 mg  650 mg Oral Q6H PRN Pahwani, Rinka R, MD       Or  . acetaminophen (TYLENOL) suppository 650 mg  650 mg Rectal Q6H PRN Pahwani, Rinka R, MD      . ceFEPIme (MAXIPIME) 2 g  in sodium chloride 0.9 % 100 mL IVPB  2 g Intravenous Q24H Bertis Ruddy, RPH 200 mL/hr at 09/07/19 1349 2 g at 09/07/19 1349  . heparin ADULT infusion 100 units/mL (25000 units/278mL sodium chloride 0.45%)  1,100 Units/hr Intravenous Continuous von [name], RPH 11 mL/hr at 09/07/19 1719 1,100 Units/hr at 09/07/19 1719  . hydrALAZINE (APRESOLINE) injection 10 mg  10 mg Intravenous Q6H PRN Pahwani, Rinka R, MD       . ipratropium-albuterol (DUONEB) 0.5-2.5 (3) MG/3ML nebulizer solution 3 mL  3 mL Inhalation Q6H PRN Ghimire, Henreitta Leber, MD      . ondansetron (ZOFRAN) tablet 4 mg  4 mg Oral Q6H PRN Pahwani, Rinka R, MD       Or  . ondansetron (ZOFRAN) injection 4 mg  4 mg Intravenous Q6H PRN Pahwani, Rinka R, MD      . vancomycin (VANCOREADY) IVPB 750 mg/150 mL  750 mg Intravenous Q48H von Dohlen, Haley B, RPH 150 mL/hr at 09/08/19 1505 750 mg at 09/08/19 1505     Discharge Medications: Please see discharge summary for a list of discharge medications.  Relevant Imaging Results:  Relevant Lab Results:   Additional Information Patients Social Security Number : [id number]  Maryclare Labrador, RN

## 2019-09-08 NOTE — Progress Notes (Signed)
Subjective:  Hemodynamically stable-  No UOP recorded - labs improving daily - according to nurse- pt failed swallowing study-  She Is really non verbal with me today Objective Vital signs in last 24 hours: Vitals:   09/08/19 0308 09/08/19 0735 09/08/19 1117 09/08/19 1454  BP:  (!) 135/44 (!) 136/56 (!) 140/48  Pulse:  74 65 68  Resp:  (!) 27 (!) 21 (!) 28  Temp: 98.2 F (36.8 C) (!) 97.4 F (36.3 C) 98 F (36.7 C) 98.5 F (36.9 C)  TempSrc: Oral Axillary Axillary Axillary  SpO2:  96% 100% 100%  Weight:      Height:       Weight change:   Intake/Output Summary (Last 24 hours) at 09/08/2019 1620 Last data filed at 09/08/2019 1500 Gross per 24 hour  Intake 1800 ml  Output --  Net 1800 ml    Assessment/ Plan: Pt is a 85 y.o. yo female with dementia- recent covid who was admitted on 09/06/2019 with worsening MS-  Found to have aspiration PNA and PE-  On abx and heparin.  Also found to have hypernatremia and A on CRF  Assessment/Plan: 1. Renal-  A on CRF-  Felt to be hemodynamic insult.  U/S neg for obstruction.  Has improved with fluids and holding ACE. Pt would not be candidate for dialysis 2. Hypernatremia-  Responding to 1/2 NS-  To continue 3. Anemia- likely multifactorial- supportive care 4. Dispo-  Is now DNR.  Plan is for [facility] to [facility].  Would not resume ACE- I at all in this patient.  And need to make sure there is mechanism in place so that she wont get dehydrated again if not eating -  Feeding tube ??  Renal will sign off-  Would continue the 1/2 ns until sodium normalizes    Rajan Burgard A Cagney Degrace    Labs: Basic Metabolic Panel: Recent Labs  Lab 09/06/19 1212 09/06/19 1212 09/06/19 1302 09/06/19 1302 09/06/19 1704 09/07/19 0236 09/07/19 0236 09/07/19 1538 09/07/19 2326 09/08/19 0817  NA 162*   < > 159*   < >  --  159*   < > 155* 154* 154*  K 5.9*   < > 4.6  --   --  4.6  --   --   --  4.3  CL >130*  --   --   --   --  129*  --   --   --   128*  CO2 17*  --   --   --   --  17*  --   --   --  15*  GLUCOSE 143*  --   --   --   --  110*  --   --   --  80  BUN 97*  --   --   --   --  87*  --   --   --  70*  CREATININE 4.38*   < >  --   --  3.68* 3.21*  --   --   --  2.20*  CALCIUM 8.3*  --   --   --   --  8.2*  --   --   --  7.9*  PHOS  --   --   --   --  5.8*  --   --   --   --   --    < > = values in this interval not displayed.   Liver Function Tests:  Recent Labs  Lab 09/06/19 1212 09/07/19 0236 09/08/19 0817  AST 54* 35 20  ALT 50* 40 27  ALKPHOS 110 84 76  BILITOT 1.8* 1.4* 1.0  PROT 7.4 6.3* 6.0*  ALBUMIN 2.0* 1.8* 1.5*   No results for input(s): LIPASE, AMYLASE in the last 168 hours. No results for input(s): AMMONIA in the last 168 hours. CBC: Recent Labs  Lab 09/06/19 1212 09/06/19 1302 09/06/19 1704 09/07/19 0232 09/07/19 0236 09/07/19 2326  WBC 7.6   < > 7.4  --  5.6 6.5  NEUTROABS 6.1  --   --   --   --   --   HGB 9.8*   < > 8.6*  --  8.8* 8.1*  HCT 34.0*   < > 30.1* 26.9* 30.4* 27.6*  MCV 106.6*  --  107.1*  --  107.0* 104.5*  PLT 232   < > 178  --  188 205   < > = values in this interval not displayed.   Cardiac Enzymes: No results for input(s): CKTOTAL, CKMB, CKMBINDEX, TROPONINI in the last 168 hours. CBG: No results for input(s): GLUCAP in the last 168 hours.  Iron Studies: No results for input(s): IRON, TIBC, TRANSFERRIN, FERRITIN in the last 72 hours. Studies/Results: NM Pulmonary Perfusion  Addendum Date: 09/07/2019   ADDENDUM REPORT: 09/07/2019 00:34 ADDENDUM: I discussed critical Value/emergent results were by telephone at the time of interpretation on 09/07/2019 at 21:50 pm to provider Dr. Laverta Baltimore, who verbally acknowledged these results. Electronically Signed   By: Revonda Humphrey   On: 09/07/2019 00:34   Result Date: 09/07/2019 CLINICAL DATA:  Dyspnea and concern for pulmonary embolism. EXAM: NUCLEAR MEDICINE PERFUSION LUNG SCAN TECHNIQUE: Perfusion images were obtained in multiple  projections after intravenous injection of radiopharmaceutical. Ventilation scans intentionally deferred if perfusion scan and chest x-ray adequate for interpretation during COVID-19 epidemic. A technical note states the patient is nonresponsive and lateral scintigraphic images were not acquired. RADIOPHARMACEUTICALS:  1.56 mCi Tc-70m MAA IV COMPARISON:  September 11, 2005. Chest radiography performed earlier on the same date. FINDINGS: There is a large left basilar wedge-shaped perfusion defect that is larger than the area of patchy left medial basilar consolidation on radiography, the perfusion-radiograph mismatch equal to at least 2 pulmonary segments. Ventilation images were deferred. A stat PRA communication was initiated. IMPRESSION: Nuclear medicine perfusion imaging with high probability of pulmonary embolism according to the modified PIOPED II criteria. Electronically Signed: By: Revonda Humphrey On: 09/06/2019 21:45   US RENAL  Result Date: 09/07/2019 CLINICAL DATA:  Acute renal injury EXAM: RENAL / URINARY TRACT ULTRASOUND COMPLETE COMPARISON:  None. FINDINGS: Right Kidney: Renal measurements: 15.4 x 5.9 x 6.9 cm = volume: 330 mL. Two large anechoic cysts measuring 6.5 and 5.0 cm. Left Kidney: Renal measurements: 8.4 x 4.4 x 4.3 cm = volume: 83 mL. Thickened cortex. No hydronephrosis. No hydronephrosis Bladder: Appears normal for degree of bladder distention. Other: Gallbladder sludge IMPRESSION: 1. No renal obstruction.  No hydronephrosis. 2. Large benign appearing renal cyst of the RIGHT kidney. 3. Asymmetric renal size with the LEFT kidney is smaller than the RIGHT. Electronically Signed   By: Suzy Bouchard M.D.   On: 09/07/2019 14:59   DG Swallowing Func-Speech Pathology  Result Date: 09/08/2019 Objective Swallowing Evaluation: Type of Study: MBS-Modified Barium Swallow Study  Patient Details Name: Leah Waller MRN: [id number] Date of Birth: 1935-01-05 Today's Date: 09/08/2019 Time: SLP Start  Time (ACUTE ONLY): 6294 -SLP Stop Time (ACUTE ONLY): 7654  SLP Time Calculation (min) (ACUTE ONLY): 21 min Past Medical History: Past Medical History: Diagnosis Date . Anxiety  . B12 deficiency  . Bipolar disorder ([place])  . Dementia ([place])  . Hypertension  . RBBB  Past Surgical History: No past surgical history on file. HPI: Pt is an 85 yo female admitted with AMS in the setting of AKI, hyponatremia, possible aspiration PNA, and concern for PE. Pt was recently admitted to [facility] for COVID-19 2/17-2/23. PMH also includes: dementia, HTN, CKD stage III  Subjective: pt intermittently responding to questions or commands Assessment / Plan / Recommendation CHL IP CLINICAL IMPRESSIONS 09/08/2019 Clinical Impression Pt with history of dementia seen for MBS after treatment session concerning for aspiration. Moderate-severe oral and moderate pharyngeal dysphagia marked by inadequate laryngeal elevation, pharyngeal contraction and tongue base retraction. Puree bolus held in oral cavity 3 minutes despite lingual pressure with dry spoon/additional volume presentations and ultimately required suctioning for first trial. Strength of swallow was reduced in particular her laryngeal elevation, pharyngeal contraction resulting in vallecular residue and laryngeal penetration. Barium adhered to pharyngeal wall and fell into vestibule, close to vocal cords. She sensed with slight throat clear and/or second swallow that only inconsistently removed penetrates. If pt consumed po's over the course of a meal the likelihood of aspiration is high. Given her dementia and age, long term alternative nutrition would not be in her best interests. Family may wish to pursue comfort feeds in which case Dys 1 (puree) and honey thick liquids. MD notified of recommendations and to speak with family. Continue NPO status for now.    SLP Visit Diagnosis Dysphagia, oropharyngeal phase (R13.12) Attention and concentration deficit following -- Frontal lobe and  executive function deficit following -- Impact on safety and function Moderate aspiration risk;Risk for inadequate nutrition/hydration   CHL IP TREATMENT RECOMMENDATION 09/08/2019 Treatment Recommendations Therapy as outlined in treatment plan below   Prognosis 09/08/2019 Prognosis for Safe Diet Advancement Fair Barriers to Reach Goals Cognitive deficits Barriers/Prognosis Comment -- CHL IP DIET RECOMMENDATION 09/08/2019 SLP Diet Recommendations NPO Liquid Administration via -- Medication Administration -- Compensations -- Postural Changes --   CHL IP OTHER RECOMMENDATIONS 09/08/2019 Recommended Consults -- Oral Care Recommendations Oral care QID Other Recommendations --   CHL IP FOLLOW UP RECOMMENDATIONS 09/08/2019 Follow up Recommendations Skilled Nursing facility   [facility] IP FREQUENCY AND DURATION 09/08/2019 Speech Therapy Frequency (ACUTE ONLY) min 1 x/week Treatment Duration 2 weeks      CHL IP ORAL PHASE 09/08/2019 Oral Phase Impaired Oral - Pudding Teaspoon -- Oral - Pudding Cup -- Oral - Honey Teaspoon -- Oral - Honey Cup Decreased bolus cohesion;Delayed oral transit;Other (Comment);Left pocketing in lateral sulci;Right pocketing in lateral sulci;Lingual/palatal residue;Holding of bolus;Reduced posterior propulsion Oral - Nectar Teaspoon -- Oral - Nectar Cup -- Oral - Nectar Straw -- Oral - Thin Teaspoon -- Oral - Thin Cup -- Oral - Thin Straw -- Oral - Puree Decreased bolus cohesion;Delayed oral transit;Other (Comment);Left pocketing in lateral sulci;Right pocketing in lateral sulci;Lingual/palatal residue;Holding of bolus;Reduced posterior propulsion Oral - Mech Soft -- Oral - Regular -- Oral - Multi-Consistency -- Oral - Pill -- Oral Phase - Comment --  CHL IP PHARYNGEAL PHASE 09/08/2019 Pharyngeal Phase Impaired Pharyngeal- Pudding Teaspoon -- Pharyngeal -- Pharyngeal- Pudding Cup -- Pharyngeal -- Pharyngeal- Honey Teaspoon -- Pharyngeal -- Pharyngeal- Honey Cup Reduced epiglottic inversion;Reduced laryngeal  elevation;Penetration/Aspiration during swallow;Pharyngeal residue - valleculae;Reduced pharyngeal peristalsis;Pharyngeal residue - pyriform;Pharyngeal residue - posterior pharnyx Pharyngeal Material enters airway, remains ABOVE vocal cords and  not ejected out Pharyngeal- Nectar Teaspoon -- Pharyngeal -- Pharyngeal- Nectar Cup -- Pharyngeal -- Pharyngeal- Nectar Straw -- Pharyngeal -- Pharyngeal- Thin Teaspoon -- Pharyngeal -- Pharyngeal- Thin Cup -- Pharyngeal -- Pharyngeal- Thin Straw -- Pharyngeal -- Pharyngeal- Puree Reduced pharyngeal peristalsis;Reduced epiglottic inversion;Reduced laryngeal elevation;Reduced airway/laryngeal closure;Pharyngeal residue - pyriform;Pharyngeal residue - valleculae;Pharyngeal residue - posterior pharnyx;Penetration/Apiration after swallow Pharyngeal Material enters airway, remains ABOVE vocal cords then ejected out Pharyngeal- Mechanical Soft -- Pharyngeal -- Pharyngeal- Regular -- Pharyngeal -- Pharyngeal- Multi-consistency -- Pharyngeal -- Pharyngeal- Pill -- Pharyngeal -- Pharyngeal Comment --  CHL IP CERVICAL ESOPHAGEAL PHASE 09/08/2019 Cervical Esophageal Phase WFL Pudding Teaspoon -- Pudding Cup -- Honey Teaspoon -- Honey Cup -- Nectar Teaspoon -- Nectar Cup -- Nectar Straw -- Thin Teaspoon -- Thin Cup -- Thin Straw -- Puree -- Mechanical Soft -- Regular -- Multi-consistency -- Pill -- Cervical Esophageal Comment -- [place] Siren 09/08/2019, 3:52 PM Orbie Pyo Litaker M.Ed Actor Pager [phone] Office [phone]              VAS [place] LOWER EXTREMITY VENOUS (DVT)  Result Date: 09/07/2019  Lower Venous DVTStudy Indications: Swelling, and SOB.  Limitations: Pt position and ability to cooperate. Performing Technologist: Antonieta Pert RDMS, RVT  Examination Guidelines: A complete evaluation includes B-mode imaging, spectral Doppler, color Doppler, and power Doppler as needed of all accessible portions of each vessel. Bilateral testing is  considered an integral part of a complete examination. Limited examinations for reoccurring indications may be performed as noted. The reflux portion of the exam is performed with the patient in reverse Trendelenburg.  +---------+---------------+---------+-----------+----------+-------------------+ RIGHT    CompressibilityPhasicitySpontaneityPropertiesThrombus Aging      +---------+---------------+---------+-----------+----------+-------------------+ CFV      Full           Yes      Yes                                      +---------+---------------+---------+-----------+----------+-------------------+ SFJ      Full                                                             +---------+---------------+---------+-----------+----------+-------------------+ FV Prox  Full                                                             +---------+---------------+---------+-----------+----------+-------------------+ FV Mid   Full                                                             +---------+---------------+---------+-----------+----------+-------------------+ FV Distal                                             pt can not  tolerate                                                       compression         +---------+---------------+---------+-----------+----------+-------------------+ PFV      Full                                                             +---------+---------------+---------+-----------+----------+-------------------+ POP                     Yes      Yes                  pt can not tolerate                                                       compression         +---------+---------------+---------+-----------+----------+-------------------+ PTV      Full                                                             +---------+---------------+---------+-----------+----------+-------------------+ PERO     Full                                                              +---------+---------------+---------+-----------+----------+-------------------+ GSV      Full                                                             +---------+---------------+---------+-----------+----------+-------------------+   +---------+---------------+---------+-----------+----------+--------------+ LEFT     CompressibilityPhasicitySpontaneityPropertiesThrombus Aging +---------+---------------+---------+-----------+----------+--------------+ CFV      Full           Yes      Yes                                 +---------+---------------+---------+-----------+----------+--------------+ SFJ      Full                                                        +---------+---------------+---------+-----------+----------+--------------+ FV Prox  Full                                                        +---------+---------------+---------+-----------+----------+--------------+  FV Mid   Full                                                        +---------+---------------+---------+-----------+----------+--------------+ FV DistalFull                                                        +---------+---------------+---------+-----------+----------+--------------+ PFV      Full                                                        +---------+---------------+---------+-----------+----------+--------------+ POP      Full           Yes      Yes                                 +---------+---------------+---------+-----------+----------+--------------+ PTV      Full                                                        +---------+---------------+---------+-----------+----------+--------------+ PERO     Full                                                        +---------+---------------+---------+-----------+----------+--------------+ GSV      Full                                                         +---------+---------------+---------+-----------+----------+--------------+     Summary: RIGHT: - There is no evidence of deep vein thrombosis in the lower extremity. However, portions of this examination were limited- see technologist comments above.  - No cystic structure found in the popliteal fossa.  LEFT: - There is no evidence of deep vein thrombosis in the lower extremity.  - No cystic structure found in the popliteal fossa.  *See table(s) above for measurements and observations. Electronically signed by Harold Barban MD on 09/07/2019 at 5:48:31 PM.    Final    Medications: Infusions: . sodium chloride 100 mL/hr at 09/08/19 0347  . ceFEPime (MAXIPIME) IV 2 g (09/07/19 1349)  . heparin 1,100 Units/hr (09/07/19 1719)  . vancomycin 750 mg (09/08/19 1505)    Scheduled Medications:   have reviewed scheduled and prn medications.  Physical Exam: General: asleep-  Opens eyes but non verbal for the most part Heart: RRR Lungs: clear Abdomen: soft, non tender Extremities: still no edema    09/08/2019,4:20 PM  LOS: 2  days

## 2019-09-08 NOTE — Progress Notes (Signed)
ANTICOAGULATION CONSULT NOTE - Follow Up Consult  Pharmacy Consult for heparin Indication: pulmonary embolus  Labs: Recent Labs    09/06/19 1212 09/06/19 1302 09/06/19 1704 09/06/19 1704 09/07/19 0236 09/07/19 1242 09/07/19 2326  HGB 9.8*   < > 8.6*   < > 8.8*  --  8.1*  HCT 34.0*   < > 30.1*  --  30.4*  --  27.6*  PLT 232   < > 178  --  188  --  205  APTT 26  --   --   --   --   --   --   LABPROT 16.7*  --   --   --   --   --   --   INR 1.4*  --   --   --   --   --   --   HEPARINUNFRC  --   --   --   --  0.24* 0.78* 0.43  CREATININE 4.38*  --  3.68*  --  3.21*  --   --    < > = values in this interval not displayed.    Assessment/Plan:  85yo female therapeutic on heparin after rate change. Will continue gtt at current rate and confirm stable with am labs.   Wynona Neat, PharmD, BCPS  09/08/2019,1:34 AM

## 2019-09-08 NOTE — Progress Notes (Signed)
  Speech Language Pathology Treatment: Dysphagia  Patient Details Name: Leah Waller MRN: [id number] DOB: Apr 24, 1935 Today's Date: 09/08/2019 Time: 5974-1638 SLP Time Calculation (min) (ACUTE ONLY): 12 min  Assessment / Plan / Recommendation Clinical Impression  RN pt is, overall better than yesterday Vocal intensity is low, decreased respiratory support and continues to demonstrate s/s aspiration. Mitt removed and pt self fed cup sips water with immediate cough and wet voice. Vocal quality cleared however wet and immediate cough with pudding trials x 2. MBS recommended which is scheduled for 1330 today. All meds are IV per RN.    HPI HPI: Pt is an 85 yo female admitted with AMS in the setting of AKI, hyponatremia, possible aspiration PNA, and concern for PE. Pt was recently admitted to [facility] for COVID-19 2/17-2/23. PMH also includes: dementia, HTN, CKD stage III      SLP Plan  MBS       Recommendations  Diet recommendations: NPO                Oral Care Recommendations: Oral care QID Follow up Recommendations: Skilled Nursing facility SLP Visit Diagnosis: Dysphagia, unspecified (R13.10) Plan: MBS                      [place] Siren 09/08/2019, 10:42 AM  Orbie Pyo Colvin Caroli.Ed Risk analyst [phone] Office [phone]

## 2019-09-08 NOTE — Progress Notes (Signed)
Pharmacy Antibiotic Note  Leah Waller is a 85 y.o. female admitted on 09/06/2019 with sepsis.  Pharmacy has been consulted for vancomycin and cefepime dosing - day #3. AKI resolving - SCr 4.38 >> 2.2.  Patient was loaded with vancomycin 1500mg  IV x 1 on 3/8.  Plan: Resume vancomycin at 750mg  IV q48h. Goal AUC 400-550. Expected AUC: 429 SCr used: 2.2 Cefepime 2g IV q24h Watch renal function closely and likely will need further dosing adjustments as AKI resolves Monitor clinical progress, c/s, vancomycin levels as indicated F/u de-escalation plan/LOT  Height: 5\' 3"  (160 cm) Weight: 151 lb 3.8 oz (68.6 kg) IBW/kg (Calculated) : 52.4  Temp (24hrs), Avg:97.8 F (36.6 C), Min:97.4 F (36.3 C), Max:98.2 F (36.8 C)  Recent Labs  Lab 09/06/19 1212 09/06/19 1704 09/07/19 0236 09/07/19 2326 09/08/19 0817  WBC 7.6 7.4 5.6 6.5  --   CREATININE 4.38* 3.68* 3.21*  --  2.20*  LATICACIDVEN 1.4  --   --   --   --     Estimated Creatinine Clearance: 18 mL/min (A) (by C-G formula based on SCr of 2.2 mg/dL (H)).    Allergies  Allergen Reactions  . Aspirin Other (See Comments)    Unspecified  . Lisinopril Other (See Comments)    Antimicrobials this admission: Vanc 3/8>> Cefepime 3/8>>  Dose adjustments this admission: n/a  Microbiology results: 3/8 BCx: GPC 1/4 - likely contaminant 3/8 UCx: 1k col E.faecalis   Arturo Morton, PharmD, BCPS Please check AMION for all [place] contact numbers Clinical Pharmacist 09/08/2019 11:27 AM

## 2019-09-08 NOTE — Progress Notes (Addendum)
[place] for Heparin Indication: Possible PE   Allergies  Allergen Reactions  . Aspirin Other (See Comments)    Unspecified  . Lisinopril Other (See Comments)    Patient Measurements: Height: 5\' 3"  (160 cm) Weight: 151 lb 3.8 oz (68.6 kg) IBW/kg (Calculated) : 52.4 Heparin Dosing Weight: 66.4 kg  Vital Signs: Temp: 97.4 F (36.3 C) (03/10 0735) Temp Source: Axillary (03/10 0735) BP: 135/44 (03/10 0735) Pulse Rate: 74 (03/10 0735)  Labs: Recent Labs    09/06/19 1212 09/06/19 1302 09/06/19 1704 09/06/19 1704 09/07/19 0236 09/07/19 0236 09/07/19 1242 09/07/19 2326 09/08/19 0817  HGB 9.8*   < > 8.6*   < > 8.8*  --   --  8.1*  --   HCT 34.0*   < > 30.1*  --  30.4*  --   --  27.6*  --   PLT 232   < > 178  --  188  --   --  205  --   APTT 26  --   --   --   --   --   --   --   --   LABPROT 16.7*  --   --   --   --   --   --   --   --   INR 1.4*  --   --   --   --   --   --   --   --   HEPARINUNFRC  --   --   --   --  0.24*   < > 0.78* 0.43 0.44  CREATININE 4.38*   < > 3.68*  --  3.21*  --   --   --  2.20*   < > = values in this interval not displayed.    Estimated Creatinine Clearance: 18 mL/min (A) (by C-G formula based on SCr of 2.2 mg/dL (H)).   Medical History: Past Medical History:  Diagnosis Date  . Anxiety   . B12 deficiency   . Bipolar disorder ([place])   . Dementia ([place])   . Hypertension   . RBBB     Medications:  Scheduled:  . vancomycin variable dose per unstable renal function (pharmacist dosing)   Does not apply See admin instructions    Assessment: 79 yof recently diagnosed with COVID ~3 weeks ago presenting with new acute SOB with increasing O2 requirements. VQ scan with high probability for PE. LE dopplers negative for DVT. Pharmacy consulted to dose heparin. Patient is not on anticoagulation PTA. Noted AKI - SCr trending down.  Heparin level remains therapeutic at 1100 units/hr. CBC drawn just before  midnight - Hg down a bit to 8.1, plt wnl. No bleeding issues reported.  Goal of Therapy:  Heparin level 0.3-0.7 units/ml Monitor platelets by anticoagulation protocol: Yes   Plan:  Continue heparin at 1100 units/hr Monitor daily heparin level and CBC, s/sx bleeding   Arturo Morton, PharmD, BCPS Please check AMION for all [place] contact numbers Clinical Pharmacist 09/08/2019 11:10 AM

## 2019-09-08 NOTE — Evaluation (Signed)
Physical Therapy Evaluation Patient Details Name: Leah Waller MRN: [id number] DOB: [birth year] Today's Date: 09/08/2019   History of Present Illness  Pt is a 85 y.o. female with medical history significant of Alzheimer's dementia, bipolar disorder, essential hypertension, CKD stage III, brought by EMS to the emergency department due to altered mental status. Pt tested positive for COVID-19 on 2/16 and has since then been declining. Per staff, pt is no longer speaking and having difficulty breathing. Pt noted to be hypoxic and placed on 6L of O2 prior to hospitalization.     Clinical Impression  Pt is on room air and vitals WNL. Pt cleared for therapy after Heparin administration on 3/8. Pt has expressive difficulties and is nonverbal other than verbalizing pain. Pt is a poor historian to gather PLOF however upon most recent hospitalization pt was living at an ALF and received help for bathing/ dressing/ eating. Unable to determine previous ambulatory status. Pt's family was called however unable to reach at this time. Pt is total A x 2 for bed mobility, sit<>stand, and squat pivot transfer over to chair. Nursing has been made aware that lift equipment is most appropriate transfer technique at this time. Pt expresses R foot pain which is investigated and pt found to have an unstageable large R heel pressure ulcer (RN took measurements). Pt appears to have no pain when resting. D/C recommendations appropriate at this time. PT will continue to follow pt acutely.     Follow Up Recommendations SNF;Supervision/Assistance - 24 hour    Equipment Recommendations  Other (comment)(TBD)       Precautions / Restrictions Precautions Precautions: Fall Precaution Comments: sig weakness, communication limitations Restrictions Weight Bearing Restrictions: No      Mobility  Bed Mobility Overal bed mobility: Needs Assistance Bed Mobility: Supine to Sit     Supine to sit: Total assist;+2 for  physical assistance     General bed mobility comments: Total A x 2 to help lift both legs (slow with R leg due to pain with movement) and total A to keep trunk upright  Transfers Overall transfer level: Needs assistance Equipment used: 2 person hand held assist Transfers: Squat Pivot Transfers Sit to Stand: Total assist;+2 physical assistance   Squat pivot transfers: Total assist;+2 physical assistance     General transfer comment: Trialed standing initially however pt total A x 2 to stand and unable to help maintain her balance. Thus squat pivot performed with heavy total A for trunk/ hip positioning      Balance Overall balance assessment: Needs assistance Sitting-balance support: Feet supported;Bilateral upper extremity supported Sitting balance-Leahy Scale: Zero Sitting balance - Comments: Pt unable to maintain upright positioning EOB independently. With cuing and assistance, pt is able to hold onto therapists hands to keep her trunk upright. However, therapist performing assistance to hold her up. Postural control: Posterior lean Standing balance support: Bilateral upper extremity supported Standing balance-Leahy Scale: Zero Standing balance comment: Pt is total A x 2 to stand. Unable to tolerate standing even when holding onto both therapists hands/arms.                             Pertinent Vitals/Pain Pain Assessment: Faces Faces Pain Scale: Hurts whole lot Pain Location: R heel Pain Descriptors / Indicators: Discomfort;Grimacing;Guarding Pain Intervention(s): Limited activity within patient's tolerance;Monitored during session;Other (comment)(RN notified and aware)    Home Living Family/patient expects to be discharged to:: Assisted living Living Arrangements: Alone Available  Help at Discharge: Family;Available PRN/intermittently           Home Equipment: (TBD- pt unreliable historian) Additional Comments: Pt is poor historian. RN reports she lives at  an ALF. VM left with granddaughter to gather more information of baseline. Daughter was called too however VM is full.    Prior Function Level of Independence: Needs assistance   Gait / Transfers Assistance Needed: unable to determine due to poor historian. Follow up with family (waiting on return call)     Comments: Previous PT eval, pt reported staff helps her with showers/dressing/eating.     Hand Dominance   Dominant Hand: Right    Extremity/Trunk Assessment   Upper Extremity Assessment Upper Extremity Assessment: Defer to OT evaluation    Lower Extremity Assessment Lower Extremity Assessment: Generalized weakness    Cervical / Trunk Assessment Cervical / Trunk Assessment: Other exceptions(unable to hold her trunk up independently)  Communication   Communication: Expressive difficulties;Other (comment)(pt speaks a few words when in pain, otherwise nonverbal)  Cognition Arousal/Alertness: Awake/alert Behavior During Therapy: Flat affect Overall Cognitive Status: History of cognitive impairments - at baseline                                 General Comments: Unable to determine baseline cog. However OT had seen her at [facility] and reports her cognition has declined since then. She has difficulty following commands, decreased attention, memory, and slowed processing.      General Comments General comments (skin integrity, edema, etc.): Pt is on room air. Vitals WNL. RN is aware of pressure ulcers (B heels unstagable).     PT Assessment Patient needs continued PT services  PT Problem List Decreased strength;Decreased activity tolerance;Decreased balance;Decreased mobility;Decreased cognition;Decreased knowledge of use of DME;Decreased safety awareness;Decreased knowledge of precautions;Cardiopulmonary status limiting activity;Decreased coordination       PT Treatment Interventions DME instruction;Gait training;Functional mobility training;Stair  training;Therapeutic activities;Therapeutic exercise;Balance training;Neuromuscular re-education;Patient/family education;Cognitive remediation    PT Goals (Current goals can be found in the Care Plan section)  Acute Rehab PT Goals Patient Stated Goal: Unable to state PT Goal Formulation: Patient unable to participate in goal setting Time For Goal Achievement: 09/22/19 Potential to Achieve Goals: Fair    Frequency Min 2X/week        Co-evaluation PT/OT/SLP Co-Evaluation/Treatment: Yes Reason for Co-Treatment: To address functional/ADL transfers;For patient/therapist safety PT goals addressed during session: Mobility/safety with mobility         AM-PAC PT "6 Clicks" Mobility  Outcome Measure Help needed turning from your back to your side while in a flat bed without using bedrails?: Total Help needed moving from lying on your back to sitting on the side of a flat bed without using bedrails?: Total Help needed moving to and from a bed to a chair (including a wheelchair)?: Total Help needed standing up from a chair using your arms (e.g., wheelchair or bedside chair)?: Total Help needed to walk in hospital room?: Total Help needed climbing 3-5 steps with a railing? : Total 6 Click Score: 6    End of Session Equipment Utilized During Treatment: Gait belt Activity Tolerance: Patient limited by fatigue Patient left: in chair;with call bell/phone within reach;with nursing/sitter in room Nurse Communication: Mobility status;Need for lift equipment;Other (comment)(pressure ulcers on heels) PT Visit Diagnosis: Muscle weakness (generalized) (M62.81);Difficulty in walking, not elsewhere classified (R26.2);Repeated falls (R29.6)    Time: 4163-8453 PT Time Calculation (min) (ACUTE ONLY):  38 min   Charges:   PT Evaluation $PT Eval Moderate Complexity: 1 Mod PT Treatments $Therapeutic Activity: 8-22 mins        Jodelle Green, PT, DPT Acute Rehabilitation Services Office [phone]   Jodelle Green 09/08/2019, 12:58 PM

## 2019-09-09 LAB — CBC
HCT: 24.8 % — ABNORMAL LOW (ref 36.0–46.0)
Hemoglobin: 7.3 g/dL — ABNORMAL LOW (ref 12.0–15.0)
MCH: 30.5 pg (ref 26.0–34.0)
MCHC: 29.4 g/dL — ABNORMAL LOW (ref 30.0–36.0)
MCV: 103.8 fL — ABNORMAL HIGH (ref 80.0–100.0)
Platelets: 232 10*3/uL (ref 150–400)
RBC: 2.39 MIL/uL — ABNORMAL LOW (ref 3.87–5.11)
RDW: 14.7 % (ref 11.5–15.5)
WBC: 7 10*3/uL (ref 4.0–10.5)
nRBC: 0.3 % — ABNORMAL HIGH (ref 0.0–0.2)

## 2019-09-09 LAB — COMPREHENSIVE METABOLIC PANEL
ALT: 26 U/L (ref 0–44)
AST: 20 U/L (ref 15–41)
Albumin: 1.3 g/dL — ABNORMAL LOW (ref 3.5–5.0)
Alkaline Phosphatase: 74 U/L (ref 38–126)
Anion gap: 9 (ref 5–15)
BUN: 60 mg/dL — ABNORMAL HIGH (ref 8–23)
CO2: 13 mmol/L — ABNORMAL LOW (ref 22–32)
Calcium: 7.8 mg/dL — ABNORMAL LOW (ref 8.9–10.3)
Chloride: 128 mmol/L — ABNORMAL HIGH (ref 98–111)
Creatinine, Ser: 1.97 mg/dL — ABNORMAL HIGH (ref 0.44–1.00)
GFR calc Af Amer: 27 mL/min — ABNORMAL LOW (ref >60)
GFR calc non Af Amer: 23 mL/min — ABNORMAL LOW (ref >60)
Glucose, Bld: 78 mg/dL (ref 70–99)
Potassium: 4.1 mmol/L (ref 3.5–5.1)
Sodium: 150 mmol/L — ABNORMAL HIGH (ref 135–145)
Total Bilirubin: 1 mg/dL (ref 0.3–1.2)
Total Protein: 5.3 g/dL — ABNORMAL LOW (ref 6.5–8.1)

## 2019-09-09 LAB — BASIC METABOLIC PANEL
Anion gap: 11 (ref 5–15)
BUN: 56 mg/dL — ABNORMAL HIGH (ref 8–23)
CO2: 12 mmol/L — ABNORMAL LOW (ref 22–32)
Calcium: 7.7 mg/dL — ABNORMAL LOW (ref 8.9–10.3)
Chloride: 127 mmol/L — ABNORMAL HIGH (ref 98–111)
Creatinine, Ser: 1.8 mg/dL — ABNORMAL HIGH (ref 0.44–1.00)
GFR calc Af Amer: 30 mL/min — ABNORMAL LOW (ref >60)
GFR calc non Af Amer: 26 mL/min — ABNORMAL LOW (ref >60)
Glucose, Bld: 73 mg/dL (ref 70–99)
Potassium: 3.9 mmol/L (ref 3.5–5.1)
Sodium: 150 mmol/L — ABNORMAL HIGH (ref 135–145)

## 2019-09-09 LAB — URINE CULTURE: Culture: 1000 — AB

## 2019-09-09 LAB — HEPARIN LEVEL (UNFRACTIONATED)
Heparin Unfractionated: 0.51 IU/mL (ref 0.30–0.70)
Heparin Unfractionated: 0.52 IU/mL (ref 0.30–0.70)

## 2019-09-09 MED ORDER — DEXTROSE 5 % IV SOLN: INTRAVENOUS | Status: AC

## 2019-09-09 MED ORDER — STARCH (THICKENING) PO POWD
ORAL | Status: DC | PRN
  Filled 2019-09-09: qty 227

## 2019-09-09 NOTE — Progress Notes (Signed)
[facility] for Heparin Indication: Possible PE   Allergies  Allergen Reactions  . Aspirin Other (See Comments)    Unspecified  . Lisinopril Other (See Comments)    Patient Measurements: Height: 5\' 3"  (160 cm) Weight: 151 lb 3.8 oz (68.6 kg) IBW/kg (Calculated) : 52.4 Heparin Dosing Weight: 66.4 kg  Vital Signs: Temp: 99.2 F (37.3 C) (03/11 1207) Temp Source: Oral (03/11 1207) BP: 135/47 (03/11 1207) Pulse Rate: 58 (03/11 1207)  Labs: Recent Labs    09/07/19 0236 09/07/19 1242 09/07/19 2326 09/07/19 2326 09/08/19 0817 09/08/19 2356 09/09/19 0751 09/09/19 1200  HGB 8.8*  --  8.1*  --   --  7.3*  --   --   HCT 30.4*  --  27.6*  --   --  24.8*  --   --   PLT 188  --  205  --   --  232  --   --   HEPARINUNFRC 0.24*   < > 0.43   < > 0.44 0.52  --  0.51  CREATININE 3.21*  --   --   --  2.20* 1.97* 1.80*  --    < > = values in this interval not displayed.    Estimated Creatinine Clearance: 22 mL/min (A) (by C-G formula based on SCr of 1.8 mg/dL (H)).   Medical History: Past Medical History:  Diagnosis Date  . Anxiety   . B12 deficiency   . Bipolar disorder ([place])   . Dementia ([place])   . Hypertension   . RBBB     Medications:  Scheduled:    Assessment: 28 yof recently diagnosed with COVID ~3 weeks ago presenting with new acute SOB with increasing O2 requirements. VQ scan with high probability for PE. LE dopplers negative for DVT. Pharmacy consulted to dose heparin. Patient is not on anticoagulation PTA. Noted AKI - SCr trending down.  Heparin level remains therapeutic at 1100 units/hr. No bleeding issues reported.  Goal of Therapy:  Heparin level 0.3-0.7 units/ml Monitor platelets by anticoagulation protocol: Yes   Plan:  Continue heparin at 1100 units/hr Monitor daily heparin level and CBC, s/sx bleeding   Alanda Slim, PharmD, [facility] Clinical Pharmacist Please see AMION for all Pharmacists' Contact Phone  Numbers 09/09/2019, 1:04 PM

## 2019-09-09 NOTE — Progress Notes (Signed)
PROGRESS NOTE        PATIENT DETAILS Name: Leah Waller Age: 85 y.o. Sex: female Date of Birth: 07-14-1934 Admit Date: 09/06/2019 Admitting Physician Mckinley Jewel, MD VFI:EPPIRJJOAC, [facility]  Brief Narrative: Patient is a 85 y.o. female with past medical history of dementia, HTN, CKD stage III-recent history of COVID-19 infection requiring hospitalization-presenting to the hospital with acute metabolic encephalopathy in the setting of AKI, hyponatremia and possible aspiration pneumonia.  Upon further imaging in the emergency room-likely thought to have pulmonary embolism as well.  See below for further details.  Significant events: 2/17-2/23>> admit to [facility] for [id number] pneumonia-complicated by acute metabolic encephalopathy and bradycardia. 3/8>> admit to [facility] for encephalopathy due to AKI, hypernatremia, aspiration pneumonia and possible PE. 3/8>> VQ scan- high probability for PE-started on IV heparin  Antimicrobial therapy: Cefepime: 3/8>> Vancomycin: 3/8>> 3/10.  Microbiology data: 3/11>> blood culture: Pending 3/8>> blood culture: 1/2+ for coag negative staph (likely contaminant) 3/8>> urine culture: 1000 colonies of Enterococcus (suspect contaminant)  Procedures : None  Consults: None  DVT Prophylaxis : Full dose anticoagulation with Heparin  Subjective: Lying comfortably in bed-no major issues overnight.  No reported history of hematochezia or melena.  Assessment/Plan: Acute metabolic encephalopathy: Secondary to hypernatremia, AKI and probable aspiration pneumonia.  Overall more improved-remains relatively awake and alert-has baseline dementia and is otherwise pleasantly confused.  Continue supportive care.  CT head without acute findings on admission.  AKI a stage IIIa: Likely hemodynamically mediated-in the setting of poor oral intake-sepsis-and use of losartan.UA without significant proteinuria.  AKI improving-continue  supportive care.  Renal ultrasound without hydronephrosis.    Hypernatremia: Secondary to poor oral intake-improving-decrease rate of IV fluid-we will change to D5W  Acute hypoxemic respiratory failure: Suspect secondary to pulmonary embolism and likely aspiration pneumonia.  Hypoxia has resolved-stable on room air-on IV heparin-and IV cefepime.  Pulmonary embolism: Likely provoked by recent Covid infection-sedentary lifestyle.  VQ scan on 3/8-with high probability.  Continue IV heparin for a few more days till oral intake stable..  Lower extremity Dopplers negative for DVT.  Sepsis likely secondary to aspiration pneumonia: Sepsis physiology has resolved-cultures as above-on IV cefepime.    Dysphagia: Was pooling secretions on admission-much better today.  SLP evaluation appreciated-spoke with daughter on 3/11-explained the risks of dysphagia 1 diet-family excepting risk of aspiration-started on dysphagia 1 diet.  Anemia: No evidence of blood loss-suspect has anemia secondary to chronic disease at baseline-worsened by acute illness and IV fluid dilution.  Follow for now.  Recent COVID-19 pneumonia with resultant debility/deconditioning and failure to thrive syndrome: Supportive care-PT/OT eval .  Does not have active Covid pneumonitis at this time.  Chronic combined systolic and diastolic heart failure (EF 45-50% by TTE on 08/20/2019): Volume status stable-follow closely while on IVF.  HTN: BP currently controlled-amlodipine, losartan on hold.  Dementia: Currently with encephalopathy-but at risk of delirium.  Will resume risperidone, Remeron  Large right renal cyst: Incidental finding on renal ultrasound-stable for outpatient follow-up.  Goals of care: Given patient's overall frailty-dementia-multiple chronic comorbidities as outlined above-now acutely ill with AKI, hypernatremia, aspiration pneumonia and PE-she remains at risk for decompensation and further deterioration.  Patient's daughter  is aware of the tenuous overall clinical situation-I have recommended that given overall poor prognosis-we make her a full DNR (golden yellow rod/MOST form at present bedside).  Plan is  to continue treatment of the acute issues to see if she recovers-if she deteriorates-daughter is aware that we may need to transition to hospice care.   Diet: Diet Order            DIET - DYS 1 Room service appropriate? Yes; Fluid consistency: Honey Thick  Diet effective now               Code Status:  DNR  Family Communication: Spoke with daughter over phone 3/11  Disposition Plan: SNF when ready for discharge  Barriers to Discharge: Sepsis secondary to aspiration pneumonia-on IV antibiotics, severe AKI and hypernatremia.  Antimicrobial agents: Anti-infectives (From admission, onward)   Start     Dose/Rate Route Frequency Ordered Stop   09/08/19 1400  vancomycin (VANCOREADY) IVPB 750 mg/150 mL  Status:  Discontinued     750 mg 150 mL/hr over 60 Minutes Intravenous Every 48 hours 09/08/19 1127 09/09/19 0706   09/07/19 1200  ceFEPIme (MAXIPIME) 2 g in sodium chloride 0.9 % 100 mL IVPB     2 g 200 mL/hr over 30 Minutes Intravenous Every 24 hours 09/06/19 1410     09/06/19 1745  ceFEPIme (MAXIPIME) 2 g in sodium chloride 0.9 % 100 mL IVPB  Status:  Discontinued     2 g 200 mL/hr over 30 Minutes Intravenous  Once 09/06/19 1730 09/06/19 1747   09/06/19 1745  vancomycin (VANCOREADY) IVPB 1500 mg/300 mL  Status:  Discontinued     1,500 mg 150 mL/hr over 120 Minutes Intravenous  Once 09/06/19 1730 09/06/19 1747   09/06/19 1410  vancomycin variable dose per unstable renal function (pharmacist dosing)  Status:  Discontinued      Does not apply See admin instructions 09/06/19 1410 09/08/19 1127   09/06/19 1300  vancomycin (VANCOREADY) IVPB 1500 mg/300 mL     1,500 mg 150 mL/hr over 120 Minutes Intravenous  Once 09/06/19 1254 09/06/19 1617   09/06/19 1215  ceFEPIme (MAXIPIME) 2 g in sodium chloride  0.9 % 100 mL IVPB     2 g 200 mL/hr over 30 Minutes Intravenous  Once 09/06/19 1214 09/06/19 1409   09/06/19 1215  metroNIDAZOLE (FLAGYL) IVPB 500 mg     500 mg 100 mL/hr over 60 Minutes Intravenous  Once 09/06/19 1214 09/06/19 1459   09/06/19 1215  vancomycin (VANCOCIN) IVPB 1000 mg/200 mL premix  Status:  Discontinued     1,000 mg 200 mL/hr over 60 Minutes Intravenous  Once 09/06/19 1214 09/06/19 1254       Time spent: 25 minutes-Greater than 50% of this time was spent in counseling, explanation of diagnosis, planning of further management, and coordination of care.  MEDICATIONS: Scheduled Meds:  Continuous Infusions: . ceFEPime (MAXIPIME) IV 2 g (09/08/19 1630)  . dextrose    . heparin 1,100 Units/hr (09/07/19 1719)   PRN Meds:.acetaminophen **OR** acetaminophen, food thickener, hydrALAZINE, ipratropium-albuterol, ondansetron **OR** ondansetron (ZOFRAN) IV   PHYSICAL EXAM: Vital signs: Vitals:   09/08/19 2325 09/09/19 0349 09/09/19 0804 09/09/19 1207  BP: (!) 144/46 (!) 118/45 (!) 144/41 (!) 135/47  Pulse: 75 62 67 (!) 58  Resp: (!) 25 (!) 22 14 16   Temp: 98.3 F (36.8 C) 98.5 F (36.9 C) 99 F (37.2 C) 99.2 F (37.3 C)  TempSrc: Axillary Axillary Oral Oral  SpO2: 98% 100% 100% 99%  Weight:      Height:       Filed Weights   09/06/19 1812  Weight: 68.6 kg   Body mass  index is 26.79 kg/m.   Gen Exam: Pleasantly confused. HEENT:atraumatic, normocephalic Chest: B/L clear to auscultation anteriorly CVS:S1S2 regular Abdomen:soft non tender, non distended Extremities:no edema Neurology: Non focal Skin: no rash  I have personally reviewed following labs and imaging studies  LABORATORY DATA: CBC: Recent Labs  Lab 09/06/19 1212 09/06/19 1212 09/06/19 1302 09/06/19 1302 09/06/19 1704 09/07/19 0232 09/07/19 0236 09/07/19 2326 09/08/19 2356  WBC 7.6  --   --   --  7.4  --  5.6 6.5 7.0  NEUTROABS 6.1  --   --   --   --   --   --   --   --   HGB 9.8*    < > 9.2*  --  8.6*  --  8.8* 8.1* 7.3*  HCT 34.0*   < > 27.0*   < > 30.1* 26.9* 30.4* 27.6* 24.8*  MCV 106.6*  --   --   --  107.1*  --  107.0* 104.5* 103.8*  PLT 232  --   --   --  178  --  188 205 232   < > = values in this interval not displayed.    Basic Metabolic Panel: Recent Labs  Lab 09/06/19 1212 09/06/19 1212 09/06/19 1302 09/06/19 1302 09/06/19 1704 09/07/19 0236 09/07/19 1538 09/07/19 2326 09/08/19 0817 09/08/19 1654 09/08/19 2356 09/09/19 0751  NA 162*   < > 159*   < >  --  159*   < > 154* 154* 152* 150* 150*  K 5.9*   < > 4.6  --   --  4.6  --   --  4.3  --  4.1 3.9  CL >130*  --   --   --   --  129*  --   --  128*  --  128* 127*  CO2 17*  --   --   --   --  17*  --   --  15*  --  13* 12*  GLUCOSE 143*  --   --   --   --  110*  --   --  80  --  78 73  BUN 97*  --   --   --   --  87*  --   --  70*  --  60* 56*  CREATININE 4.38*   < >  --   --  3.68* 3.21*  --   --  2.20*  --  1.97* 1.80*  CALCIUM 8.3*  --   --   --   --  8.2*  --   --  7.9*  --  7.8* 7.7*  MG  --   --   --   --  2.9*  --   --   --   --   --   --   --   PHOS  --   --   --   --  5.8*  --   --   --   --   --   --   --    < > = values in this interval not displayed.    GFR: Estimated Creatinine Clearance: 22 mL/min (A) (by C-G formula based on SCr of 1.8 mg/dL (H)).  Liver Function Tests: Recent Labs  Lab 09/06/19 1212 09/07/19 0236 09/08/19 0817 09/08/19 2356  AST 54* 35 20 20  ALT 50* 40 27 26  ALKPHOS 110 84 76 74  BILITOT 1.8* 1.4* 1.0 1.0  PROT 7.4 6.3*  6.0* 5.3*  ALBUMIN 2.0* 1.8* 1.5* 1.3*   No results for input(s): LIPASE, AMYLASE in the last 168 hours. No results for input(s): AMMONIA in the last 168 hours.  Coagulation Profile: Recent Labs  Lab 09/06/19 1212  INR 1.4*    Cardiac Enzymes: No results for input(s): CKTOTAL, CKMB, CKMBINDEX, TROPONINI in the last 168 hours.  BNP (last 3 results) No results for input(s): PROBNP in the last 8760 hours.  Lipid  Profile: No results for input(s): CHOL, HDL, LDLCALC, TRIG, CHOLHDL, LDLDIRECT in the last 72 hours.  Thyroid Function Tests: No results for input(s): TSH, T4TOTAL, FREET4, T3FREE, THYROIDAB in the last 72 hours.  Anemia Panel: Recent Labs    09/07/19 0232  VITAMINB12 1,417*    Urine analysis:    Component Value Date/Time   COLORURINE AMBER (A) 09/06/2019 2204   APPEARANCEUR CLOUDY (A) 09/06/2019 2204   LABSPEC 1.019 09/06/2019 2204   PHURINE 5.0 09/06/2019 2204   GLUCOSEU NEGATIVE 09/06/2019 2204   HGBUR SMALL (A) 09/06/2019 2204   BILIRUBINUR NEGATIVE 09/06/2019 [place] 09/06/2019 2204   PROTEINUR 30 (A) 09/06/2019 2204   NITRITE NEGATIVE 09/06/2019 2204   LEUKOCYTESUR SMALL (A) 09/06/2019 2204    Sepsis Labs: Lactic Acid, Venous    Component Value Date/Time   LATICACIDVEN 1.4 09/06/2019 1212    MICROBIOLOGY: Recent Results (from the past 240 hour(s))  Blood Culture (routine x 2)     Status: None (Preliminary result)   Collection Time: 09/06/19 12:55 PM   Specimen: BLOOD LEFT HAND  Result Value Ref Range Status   Specimen Description BLOOD LEFT HAND  Final   Special Requests   Final    BOTTLES DRAWN AEROBIC ONLY Blood Culture results may not be optimal due to an excessive volume of blood received in culture bottles   Culture   Final    NO GROWTH 3 DAYS Performed at [facility], [place] [address], [place], [place] [address]    Report Status PENDING  Incomplete  Blood Culture (routine x 2)     Status: Abnormal   Collection Time: 09/06/19  1:05 PM   Specimen: BLOOD  Result Value Ref Range Status   Specimen Description BLOOD RIGHT ANTECUBITAL  Final   Special Requests   Final    BOTTLES DRAWN AEROBIC AND ANAEROBIC Blood Culture results may not be optimal due to an excessive volume of blood received in culture bottles   Culture  Setup Time   Final    AEROBIC BOTTLE ONLY GRAM POSITIVE COCCI IN CLUSTERS CRITICAL RESULT CALLED TO, READ BACK  BY AND VERIFIED WITH: J CARNEY PHARMD 09/07/19 1721 JDW    Culture (A)  Final    STAPHYLOCOCCUS SPECIES (COAGULASE NEGATIVE) THE SIGNIFICANCE OF ISOLATING THIS ORGANISM FROM A SINGLE SET OF BLOOD CULTURES WHEN MULTIPLE SETS ARE DRAWN IS UNCERTAIN. PLEASE NOTIFY THE MICROBIOLOGY DEPARTMENT WITHIN ONE WEEK IF SPECIATION AND SENSITIVITIES ARE REQUIRED. Performed at [facility], [place] [address], [place], [place] [address]    Report Status 09/08/2019 FINAL  Final  Urine culture     Status: Abnormal   Collection Time: 09/06/19 10:04 PM   Specimen: In/Out Cath Urine  Result Value Ref Range Status   Specimen Description IN/OUT CATH URINE  Final   Special Requests   Final    NONE Performed at [facility], [place] [address] [place], [place] [address]    Culture 1,000 COLONIES/mL ENTEROCOCCUS FAECALIS (A)  Final  Report Status 09/09/2019 FINAL  Final   Organism ID, Bacteria ENTEROCOCCUS FAECALIS (A)  Final      Susceptibility   Enterococcus faecalis - MIC*    AMPICILLIN <=2 SENSITIVE Sensitive     NITROFURANTOIN <=16 SENSITIVE Sensitive     VANCOMYCIN 1 SENSITIVE Sensitive     * 1,000 COLONIES/mL ENTEROCOCCUS FAECALIS  MRSA PCR Screening     Status: None   Collection Time: 09/08/19 11:39 AM   Specimen: Nasal Mucosa; Nasopharyngeal  Result Value Ref Range Status   MRSA by PCR NEGATIVE NEGATIVE Final    Comment:        The GeneXpert MRSA Assay (FDA approved for NASAL specimens only), is one component of a comprehensive MRSA colonization surveillance program. It is not intended to diagnose MRSA infection nor to guide or monitor treatment for MRSA infections. Performed at [facility], [place] [address], [place], [place] [address]     RADIOLOGY STUDIES/RESULTS: US RENAL  Result Date: 09/07/2019 CLINICAL DATA:  Acute renal injury EXAM: RENAL / URINARY TRACT ULTRASOUND COMPLETE COMPARISON:  None. FINDINGS: Right Kidney: Renal measurements: 15.4 x 5.9 x 6.9 cm = volume: 330  mL. Two large anechoic cysts measuring 6.5 and 5.0 cm. Left Kidney: Renal measurements: 8.4 x 4.4 x 4.3 cm = volume: 83 mL. Thickened cortex. No hydronephrosis. No hydronephrosis Bladder: Appears normal for degree of bladder distention. Other: Gallbladder sludge IMPRESSION: 1. No renal obstruction.  No hydronephrosis. 2. Large benign appearing renal cyst of the RIGHT kidney. 3. Asymmetric renal size with the LEFT kidney is smaller than the RIGHT. Electronically Signed   By: Suzy Bouchard M.D.   On: 09/07/2019 14:59   DG Swallowing Func-Speech Pathology  Result Date: 09/08/2019 Objective Swallowing Evaluation: Type of Study: MBS-Modified Barium Swallow Study  Patient Details Name: Rilynne Lonsway MRN: [id number] Date of Birth: 09-11-1934 Today's Date: 09/08/2019 Time: SLP Start Time (ACUTE ONLY): 7169 -SLP Stop Time (ACUTE ONLY): 1438 SLP Time Calculation (min) (ACUTE ONLY): 21 min Past Medical History: Past Medical History: Diagnosis Date . Anxiety  . B12 deficiency  . Bipolar disorder ([place])  . Dementia ([place])  . Hypertension  . RBBB  Past Surgical History: No past surgical history on file. HPI: Pt is an 85 yo female admitted with AMS in the setting of AKI, hyponatremia, possible aspiration PNA, and concern for PE. Pt was recently admitted to [facility] for COVID-19 2/17-2/23. PMH also includes: dementia, HTN, CKD stage III  Subjective: pt intermittently responding to questions or commands Assessment / Plan / Recommendation CHL IP CLINICAL IMPRESSIONS 09/08/2019 Clinical Impression Pt with history of dementia seen for MBS after treatment session concerning for aspiration. Moderate-severe oral and moderate pharyngeal dysphagia marked by inadequate laryngeal elevation, pharyngeal contraction and tongue base retraction. Puree bolus held in oral cavity 3 minutes despite lingual pressure with dry spoon/additional volume presentations and ultimately required suctioning for first trial. Strength of swallow was reduced in  particular her laryngeal elevation, pharyngeal contraction resulting in vallecular residue and laryngeal penetration. Barium adhered to pharyngeal wall and fell into vestibule, close to vocal cords. She sensed with slight throat clear and/or second swallow that only inconsistently removed penetrates. If pt consumed po's over the course of a meal the likelihood of aspiration is high. Given her dementia and age, long term alternative nutrition would not be in her best interests. Family may wish to pursue comfort feeds in which case Dys 1 (puree) and honey thick liquids. MD notified of recommendations and to  speak with family. Continue NPO status for now.    SLP Visit Diagnosis Dysphagia, oropharyngeal phase (R13.12) Attention and concentration deficit following -- Frontal lobe and executive function deficit following -- Impact on safety and function Moderate aspiration risk;Risk for inadequate nutrition/hydration   CHL IP TREATMENT RECOMMENDATION 09/08/2019 Treatment Recommendations Therapy as outlined in treatment plan below   Prognosis 09/08/2019 Prognosis for Safe Diet Advancement Fair Barriers to Reach Goals Cognitive deficits Barriers/Prognosis Comment -- CHL IP DIET RECOMMENDATION 09/08/2019 SLP Diet Recommendations NPO Liquid Administration via -- Medication Administration -- Compensations -- Postural Changes --   CHL IP OTHER RECOMMENDATIONS 09/08/2019 Recommended Consults -- Oral Care Recommendations Oral care QID Other Recommendations --   CHL IP FOLLOW UP RECOMMENDATIONS 09/08/2019 Follow up Recommendations Skilled Nursing facility   [facility] IP FREQUENCY AND DURATION 09/08/2019 Speech Therapy Frequency (ACUTE ONLY) min 1 x/week Treatment Duration 2 weeks      CHL IP ORAL PHASE 09/08/2019 Oral Phase Impaired Oral - Pudding Teaspoon -- Oral - Pudding Cup -- Oral - Honey Teaspoon -- Oral - Honey Cup Decreased bolus cohesion;Delayed oral transit;Other (Comment);Left pocketing in lateral sulci;Right pocketing in lateral  sulci;Lingual/palatal residue;Holding of bolus;Reduced posterior propulsion Oral - Nectar Teaspoon -- Oral - Nectar Cup -- Oral - Nectar Straw -- Oral - Thin Teaspoon -- Oral - Thin Cup -- Oral - Thin Straw -- Oral - Puree Decreased bolus cohesion;Delayed oral transit;Other (Comment);Left pocketing in lateral sulci;Right pocketing in lateral sulci;Lingual/palatal residue;Holding of bolus;Reduced posterior propulsion Oral - Mech Soft -- Oral - Regular -- Oral - Multi-Consistency -- Oral - Pill -- Oral Phase - Comment --  CHL IP PHARYNGEAL PHASE 09/08/2019 Pharyngeal Phase Impaired Pharyngeal- Pudding Teaspoon -- Pharyngeal -- Pharyngeal- Pudding Cup -- Pharyngeal -- Pharyngeal- Honey Teaspoon -- Pharyngeal -- Pharyngeal- Honey Cup Reduced epiglottic inversion;Reduced laryngeal elevation;Penetration/Aspiration during swallow;Pharyngeal residue - valleculae;Reduced pharyngeal peristalsis;Pharyngeal residue - pyriform;Pharyngeal residue - posterior pharnyx Pharyngeal Material enters airway, remains ABOVE vocal cords and not ejected out Pharyngeal- Nectar Teaspoon -- Pharyngeal -- Pharyngeal- Nectar Cup -- Pharyngeal -- Pharyngeal- Nectar Straw -- Pharyngeal -- Pharyngeal- Thin Teaspoon -- Pharyngeal -- Pharyngeal- Thin Cup -- Pharyngeal -- Pharyngeal- Thin Straw -- Pharyngeal -- Pharyngeal- Puree Reduced pharyngeal peristalsis;Reduced epiglottic inversion;Reduced laryngeal elevation;Reduced airway/laryngeal closure;Pharyngeal residue - pyriform;Pharyngeal residue - valleculae;Pharyngeal residue - posterior pharnyx;Penetration/Apiration after swallow Pharyngeal Material enters airway, remains ABOVE vocal cords then ejected out Pharyngeal- Mechanical Soft -- Pharyngeal -- Pharyngeal- Regular -- Pharyngeal -- Pharyngeal- Multi-consistency -- Pharyngeal -- Pharyngeal- Pill -- Pharyngeal -- Pharyngeal Comment --  CHL IP CERVICAL ESOPHAGEAL PHASE 09/08/2019 Cervical Esophageal Phase WFL Pudding Teaspoon -- Pudding Cup -- Honey  Teaspoon -- Honey Cup -- Nectar Teaspoon -- Nectar Cup -- Nectar Straw -- Thin Teaspoon -- Thin Cup -- Thin Straw -- Puree -- Mechanical Soft -- Regular -- Multi-consistency -- Pill -- Cervical Esophageal Comment -- Houston Siren 09/08/2019, 3:52 PM Orbie Pyo Litaker M.Ed Actor Pager [phone] Office [phone]              VAS [place] LOWER EXTREMITY VENOUS (DVT)  Result Date: 09/07/2019  Lower Venous DVTStudy Indications: Swelling, and SOB.  Limitations: Pt position and ability to cooperate. Performing Technologist: Antonieta Pert RDMS, RVT  Examination Guidelines: A complete evaluation includes B-mode imaging, spectral Doppler, color Doppler, and power Doppler as needed of all accessible portions of each vessel. Bilateral testing is considered an integral part of a complete examination. Limited examinations for reoccurring indications may be performed as noted. The reflux  portion of the exam is performed with the patient in reverse Trendelenburg.  +---------+---------------+---------+-----------+----------+-------------------+ RIGHT    CompressibilityPhasicitySpontaneityPropertiesThrombus Aging      +---------+---------------+---------+-----------+----------+-------------------+ CFV      Full           Yes      Yes                                      +---------+---------------+---------+-----------+----------+-------------------+ SFJ      Full                                                             +---------+---------------+---------+-----------+----------+-------------------+ FV Prox  Full                                                             +---------+---------------+---------+-----------+----------+-------------------+ FV Mid   Full                                                             +---------+---------------+---------+-----------+----------+-------------------+ FV Distal                                              pt can not tolerate                                                       compression         +---------+---------------+---------+-----------+----------+-------------------+ PFV      Full                                                             +---------+---------------+---------+-----------+----------+-------------------+ POP                     Yes      Yes                  pt can not tolerate                                                       compression         +---------+---------------+---------+-----------+----------+-------------------+ PTV      Full                                                             +---------+---------------+---------+-----------+----------+-------------------+  PERO     Full                                                             +---------+---------------+---------+-----------+----------+-------------------+ GSV      Full                                                             +---------+---------------+---------+-----------+----------+-------------------+   +---------+---------------+---------+-----------+----------+--------------+ LEFT     CompressibilityPhasicitySpontaneityPropertiesThrombus Aging +---------+---------------+---------+-----------+----------+--------------+ CFV      Full           Yes      Yes                                 +---------+---------------+---------+-----------+----------+--------------+ SFJ      Full                                                        +---------+---------------+---------+-----------+----------+--------------+ FV Prox  Full                                                        +---------+---------------+---------+-----------+----------+--------------+ FV Mid   Full                                                        +---------+---------------+---------+-----------+----------+--------------+ FV DistalFull                                                         +---------+---------------+---------+-----------+----------+--------------+ PFV      Full                                                        +---------+---------------+---------+-----------+----------+--------------+ POP      Full           Yes      Yes                                 +---------+---------------+---------+-----------+----------+--------------+ PTV      Full                                                        +---------+---------------+---------+-----------+----------+--------------+  PERO     Full                                                        +---------+---------------+---------+-----------+----------+--------------+ GSV      Full                                                        +---------+---------------+---------+-----------+----------+--------------+     Summary: RIGHT: - There is no evidence of deep vein thrombosis in the lower extremity. However, portions of this examination were limited- see technologist comments above.  - No cystic structure found in the popliteal fossa.  LEFT: - There is no evidence of deep vein thrombosis in the lower extremity.  - No cystic structure found in the popliteal fossa.  *See table(s) above for measurements and observations. Electronically signed by Harold Barban MD on 09/07/2019 at 5:48:31 PM.    Final      LOS: 3 days   Oren Binet, MD  [facility]    To contact the attending provider between 7A-7P or the covering provider during after hours 7P-7A, please log into the web site [email or web address] and access using universal [place] password for that web site. If you do not have the password, please call the hospital operator.  09/09/2019, 2:03 PM

## 2019-09-09 NOTE — Care Management Important Message (Signed)
Important Message  Patient Details  Name: Rogena Deupree MRN: [id number] Date of Birth: 12/30/1934   Medicare Important Message Given:  Yes   IM given patient could not sign.   Copy left a patient bedside.  Sulo Janczak 09/09/2019, 11:26 AM

## 2019-09-09 NOTE — Plan of Care (Signed)

## 2019-09-10 LAB — CBC
HCT: 25.1 % — ABNORMAL LOW (ref 36.0–46.0)
Hemoglobin: 7.7 g/dL — ABNORMAL LOW (ref 12.0–15.0)
MCH: 30.8 pg (ref 26.0–34.0)
MCHC: 30.7 g/dL (ref 30.0–36.0)
MCV: 100.4 fL — ABNORMAL HIGH (ref 80.0–100.0)
Platelets: 253 10*3/uL (ref 150–400)
RBC: 2.5 MIL/uL — ABNORMAL LOW (ref 3.87–5.11)
RDW: 14.6 % (ref 11.5–15.5)
WBC: 8.2 10*3/uL (ref 4.0–10.5)
nRBC: 0.4 % — ABNORMAL HIGH (ref 0.0–0.2)

## 2019-09-10 LAB — BASIC METABOLIC PANEL
Anion gap: 10 (ref 5–15)
BUN: 47 mg/dL — ABNORMAL HIGH (ref 8–23)
CO2: 13 mmol/L — ABNORMAL LOW (ref 22–32)
Calcium: 8.3 mg/dL — ABNORMAL LOW (ref 8.9–10.3)
Chloride: 125 mmol/L — ABNORMAL HIGH (ref 98–111)
Creatinine, Ser: 1.59 mg/dL — ABNORMAL HIGH (ref 0.44–1.00)
GFR calc Af Amer: 34 mL/min — ABNORMAL LOW (ref >60)
GFR calc non Af Amer: 30 mL/min — ABNORMAL LOW (ref >60)
Glucose, Bld: 101 mg/dL — ABNORMAL HIGH (ref 70–99)
Potassium: 3.8 mmol/L (ref 3.5–5.1)
Sodium: 148 mmol/L — ABNORMAL HIGH (ref 135–145)

## 2019-09-10 LAB — HEPARIN LEVEL (UNFRACTIONATED): Heparin Unfractionated: 0.4 IU/mL (ref 0.30–0.70)

## 2019-09-10 MED ORDER — APIXABAN 5 MG PO TABS
10.0000 mg | ORAL_TABLET | Freq: Two times a day (BID) | ORAL | Status: AC
  Administered 2019-09-10 – 2019-09-13 (×6): 10 mg via ORAL
  Filled 2019-09-10 (×7): qty 2

## 2019-09-10 MED ORDER — SODIUM CHLORIDE 0.9 % IV SOLN
3.0000 g | Freq: Two times a day (BID) | INTRAVENOUS | Status: AC
  Administered 2019-09-10 – 2019-09-12 (×5): 3 g via INTRAVENOUS
  Filled 2019-09-10 (×5): qty 3

## 2019-09-10 MED ORDER — APIXABAN 5 MG PO TABS: 5.0000 mg | ORAL_TABLET | Freq: Two times a day (BID) | ORAL | Status: DC

## 2019-09-10 MED ORDER — RESOURCE THICKENUP CLEAR PO POWD
Freq: Once | ORAL | Status: AC
  Filled 2019-09-10: qty 125

## 2019-09-10 MED ORDER — SODIUM CHLORIDE 0.45 % IV SOLN: INTRAVENOUS | Status: AC

## 2019-09-10 NOTE — Progress Notes (Signed)
PROGRESS NOTE        PATIENT DETAILS Name: Leah Waller Age: 85 y.o. Sex: female Date of Birth: [birth year] Admit Date: 09/06/2019 Admitting Physician Mckinley Jewel, MD [id number], [facility]  Brief Narrative: Patient is a 85 y.o. female with past medical history of dementia, HTN, CKD stage III-recent history of COVID-19 infection requiring hospitalization-presenting to the hospital with acute metabolic encephalopathy in the setting of AKI, hyponatremia and possible aspiration pneumonia.  Upon further imaging in the emergency room-likely thought to have pulmonary embolism as well.  See below for further details.  Significant events: 2/17-2/23>> admit to [facility] for MEQAS-34 pneumonia-complicated by acute metabolic encephalopathy and bradycardia. 3/8>> admit to [facility] for encephalopathy due to AKI, hypernatremia, aspiration pneumonia and possible PE. 3/8>> VQ scan- high probability for PE-started on IV heparin  Antimicrobial therapy: Cefepime: 3/8>> Vancomycin: 3/8>> 3/10.  Microbiology data: 3/11>> blood culture: No growth 3/8>> blood culture: 1/2+ for coag negative staph (likely contaminant) 3/8>> urine culture: 1000 colonies of Enterococcus (suspect contaminant)  Procedures : None  Consults: None  DVT Prophylaxis : Full dose anticoagulation with Heparin  Subjective: Lying comfortably in bed- remains confused-follows some commands.  Spoke 1 word softly to me.  Nods head.  Assessment/Plan: Acute metabolic encephalopathy: Secondary to hypernatremia, AKI and probable aspiration pneumonia.  Overall more improved-remains relatively awake and alert-has baseline dementia and is otherwise pleasantly confused.  Continue supportive care.  CT head without acute findings on admission.  AKI a stage IIIa: Likely hemodynamically mediated-in the setting of poor oral intake-sepsis-and use of losartan.UA without significant proteinuria.  AKI  improving-continue supportive care.  Renal ultrasound without hydronephrosis.    Hypernatremia: Secondary to poor oral intake-improving-gentle hydration with half-normal saline for a few more hours today.  Acute hypoxemic respiratory failure: Suspect secondary to pulmonary embolism and likely aspiration pneumonia.  Hypoxia has resolved-stable on room air-with IV antibiotics and anticoagulation  Pulmonary embolism: Likely provoked by recent Covid infection-sedentary lifestyle.  VQ scan on 3/8-with high probability.  Lower extremity Dopplers negative for DVT.  Stable on IV heparin-now that oral intake is stable-transition to Eliquis.    Sepsis likely secondary to aspiration pneumonia: Sepsis physiology has resolved-cultures as above-transition to Unasyn  Dysphagia: Was pooling secretions on admission-much better today.  SLP evaluation appreciated-spoke with daughter on 3/11-explained the risks of dysphagia 1 diet-family excepting risk of aspiration-started on dysphagia 1 diet.  Anemia: No evidence of blood loss-suspect has anemia secondary to chronic disease at baseline-worsened by acute illness and IV fluid dilution.  Follow for now.  Recent COVID-19 pneumonia with resultant debility/deconditioning and failure to thrive syndrome: Supportive care-PT/OT eval .  Does not have active Covid pneumonitis at this time.  Chronic combined systolic and diastolic heart failure (EF 45-50% by TTE on 08/20/2019): Volume status stable-follow closely while on IVF.  HTN: BP currently controlled-amlodipine, losartan on hold.  Dementia: Currently with encephalopathy-but at risk of delirium.  Will resume risperidone, Remeron  Large right renal cyst: Incidental finding on renal ultrasound-stable for outpatient follow-up.  Goals of care: Given patient's overall frailty-dementia-multiple chronic comorbidities as outlined above-now acutely ill with AKI, hypernatremia, aspiration pneumonia and PE-she remains at risk for  decompensation and further deterioration.  Patient's daughter is aware of the tenuous overall clinical situation-I have recommended that given overall poor prognosis-we make her a full DNR (golden yellow rod/MOST form at  present bedside).  Plan is to continue treatment of the acute issues to see if she recovers-if she deteriorates-daughter is aware that we may need to transition to hospice care.   Diet: Diet Order            DIET - DYS 1 Room service appropriate? Yes; Fluid consistency: Honey Thick  Diet effective now               Code Status:  DNR  Family Communication: Spoke with daughter over phone 3/12  Disposition Plan: SNF when ready for discharge  Barriers to Discharge: Sepsis secondary to aspiration pneumonia-on IV antibiotics, severe AKI and hypernatremia.  Antimicrobial agents: Anti-infectives (From admission, onward)   Start     Dose/Rate Route Frequency Ordered Stop   09/10/19 1600  Ampicillin-Sulbactam (UNASYN) 3 g in sodium chloride 0.9 % 100 mL IVPB     3 g 200 mL/hr over 30 Minutes Intravenous Every 12 hours 09/10/19 1119 09/13/19 0359   09/08/19 1400  vancomycin (VANCOREADY) IVPB 750 mg/150 mL  Status:  Discontinued     750 mg 150 mL/hr over 60 Minutes Intravenous Every 48 hours 09/08/19 1127 09/09/19 0706   09/07/19 1200  ceFEPIme (MAXIPIME) 2 g in sodium chloride 0.9 % 100 mL IVPB  Status:  Discontinued     2 g 200 mL/hr over 30 Minutes Intravenous Every 24 hours 09/06/19 1410 09/10/19 1119   09/06/19 1745  ceFEPIme (MAXIPIME) 2 g in sodium chloride 0.9 % 100 mL IVPB  Status:  Discontinued     2 g 200 mL/hr over 30 Minutes Intravenous  Once 09/06/19 1730 09/06/19 1747   09/06/19 1745  vancomycin (VANCOREADY) IVPB 1500 mg/300 mL  Status:  Discontinued     1,500 mg 150 mL/hr over 120 Minutes Intravenous  Once 09/06/19 1730 09/06/19 1747   09/06/19 1410  vancomycin variable dose per unstable renal function (pharmacist dosing)  Status:  Discontinued       Does not apply See admin instructions 09/06/19 1410 09/08/19 1127   09/06/19 1300  vancomycin (VANCOREADY) IVPB 1500 mg/300 mL     1,500 mg 150 mL/hr over 120 Minutes Intravenous  Once 09/06/19 1254 09/06/19 1617   09/06/19 1215  ceFEPIme (MAXIPIME) 2 g in sodium chloride 0.9 % 100 mL IVPB     2 g 200 mL/hr over 30 Minutes Intravenous  Once 09/06/19 1214 09/06/19 1409   09/06/19 1215  metroNIDAZOLE (FLAGYL) IVPB 500 mg     500 mg 100 mL/hr over 60 Minutes Intravenous  Once 09/06/19 1214 09/06/19 1459   09/06/19 1215  vancomycin (VANCOCIN) IVPB 1000 mg/200 mL premix  Status:  Discontinued     1,000 mg 200 mL/hr over 60 Minutes Intravenous  Once 09/06/19 1214 09/06/19 1254       Time spent: 25 minutes-Greater than 50% of this time was spent in counseling, explanation of diagnosis, planning of further management, and coordination of care.  MEDICATIONS: Scheduled Meds:  Continuous Infusions: . ampicillin-sulbactam (UNASYN) IV    . heparin 1,100 Units/hr (09/09/19 1738)   PRN Meds:.acetaminophen **OR** acetaminophen, food thickener, hydrALAZINE, ipratropium-albuterol, ondansetron **OR** ondansetron (ZOFRAN) IV   PHYSICAL EXAM: Vital signs: Vitals:   09/10/19 0408 09/10/19 0747 09/10/19 0958 09/10/19 1231  BP: (!) 141/48 (!) 144/52  137/61  Pulse: 60 61  64  Resp: 20 18 (!) 24 (!) 21  Temp: 98.5 F (36.9 C) 97.7 F (36.5 C)  97.9 F (36.6 C)  TempSrc: Oral Oral  Axillary  SpO2:  99% 100% 100% 100%  Weight:      Height:       Filed Weights   09/06/19 1812  Weight: 68.6 kg   Body mass index is 26.79 kg/m.   Gen Exam:Alert-confused-but pleasant. Not in distress HEENT:atraumatic, normocephalic Chest: B/L clear to auscultation anteriorly CVS:S1S2 regular Abdomen:soft non tender, non distended Extremities:no edema Neurology: Gen weakness-but non focal Skin: no rash  I have personally reviewed following labs and imaging studies  LABORATORY DATA: CBC: Recent Labs    Lab 09/06/19 1212 09/06/19 1302 09/06/19 1704 09/07/19 0232 09/07/19 0236 09/07/19 2326 09/08/19 2356 09/10/19 0237  WBC 7.6   < > 7.4  --  5.6 6.5 7.0 8.2  NEUTROABS 6.1  --   --   --   --   --   --   --   HGB 9.8*   < > 8.6*  --  8.8* 8.1* 7.3* 7.7*  HCT 34.0*   < > 30.1* 26.9* 30.4* 27.6* 24.8* 25.1*  MCV 106.6*   < > 107.1*  --  107.0* 104.5* 103.8* 100.4*  PLT 232   < > 178  --  188 205 232 253   < > = values in this interval not displayed.    Basic Metabolic Panel: Recent Labs  Lab 09/06/19 1212 09/06/19 1704 09/07/19 0236 09/07/19 1538 09/08/19 0817 09/08/19 1654 09/08/19 2356 09/09/19 0751 09/10/19 0237  NA   < >  --  159*   < > 154* 152* 150* 150* 148*  K   < >  --  4.6  --  4.3  --  4.1 3.9 3.8  CL   < >  --  129*  --  128*  --  128* 127* 125*  CO2   < >  --  17*  --  15*  --  13* 12* 13*  GLUCOSE   < >  --  110*  --  80  --  78 73 101*  BUN   < >  --  87*  --  70*  --  60* 56* 47*  CREATININE   < > 3.68* 3.21*  --  2.20*  --  1.97* 1.80* 1.59*  CALCIUM   < >  --  8.2*  --  7.9*  --  7.8* 7.7* 8.3*  MG  --  2.9*  --   --   --   --   --   --   --   PHOS  --  5.8*  --   --   --   --   --   --   --    < > = values in this interval not displayed.    GFR: Estimated Creatinine Clearance: 24.9 mL/min (A) (by C-G formula based on SCr of 1.59 mg/dL (H)).  Liver Function Tests: Recent Labs  Lab 09/06/19 1212 09/07/19 0236 09/08/19 0817 09/08/19 2356  AST 54* 35 20 20  ALT 50* 40 27 26  ALKPHOS 110 84 76 74  BILITOT 1.8* 1.4* 1.0 1.0  PROT 7.4 6.3* 6.0* 5.3*  ALBUMIN 2.0* 1.8* 1.5* 1.3*   No results for input(s): LIPASE, AMYLASE in the last 168 hours. No results for input(s): AMMONIA in the last 168 hours.  Coagulation Profile: Recent Labs  Lab 09/06/19 1212  INR 1.4*    Cardiac Enzymes: No results for input(s): CKTOTAL, CKMB, CKMBINDEX, TROPONINI in the last 168 hours.  BNP (last 3 results) No results for input(s): PROBNP in the  last 8760  hours.  Lipid Profile: No results for input(s): CHOL, HDL, LDLCALC, TRIG, CHOLHDL, LDLDIRECT in the last 72 hours.  Thyroid Function Tests: No results for input(s): TSH, T4TOTAL, FREET4, T3FREE, THYROIDAB in the last 72 hours.  Anemia Panel: No results for input(s): VITAMINB12, FOLATE, FERRITIN, TIBC, IRON, RETICCTPCT in the last 72 hours.  Urine analysis:    Component Value Date/Time   COLORURINE AMBER (A) 09/06/2019 2204   APPEARANCEUR CLOUDY (A) 09/06/2019 2204   LABSPEC 1.019 09/06/2019 2204   PHURINE 5.0 09/06/2019 2204   GLUCOSEU NEGATIVE 09/06/2019 2204   HGBUR SMALL (A) 09/06/2019 2204   BILIRUBINUR NEGATIVE 09/06/2019 [place] 09/06/2019 2204   PROTEINUR 30 (A) 09/06/2019 2204   NITRITE NEGATIVE 09/06/2019 2204   LEUKOCYTESUR SMALL (A) 09/06/2019 2204    Sepsis Labs: Lactic Acid, Venous    Component Value Date/Time   LATICACIDVEN 1.4 09/06/2019 1212    MICROBIOLOGY: Recent Results (from the past 240 hour(s))  Blood Culture (routine x 2)     Status: None (Preliminary result)   Collection Time: 09/06/19 12:55 PM   Specimen: BLOOD LEFT HAND  Result Value Ref Range Status   Specimen Description BLOOD LEFT HAND  Final   Special Requests   Final    BOTTLES DRAWN AEROBIC ONLY Blood Culture results may not be optimal due to an excessive volume of blood received in culture bottles   Culture   Final    NO GROWTH 4 DAYS Performed at [facility], [place] [address] [place], [place] [address]    Report Status PENDING  Incomplete  Blood Culture (routine x 2)     Status: Abnormal   Collection Time: 09/06/19  1:05 PM   Specimen: BLOOD  Result Value Ref Range Status   Specimen Description BLOOD RIGHT ANTECUBITAL  Final   Special Requests   Final    BOTTLES DRAWN AEROBIC AND ANAEROBIC Blood Culture results may not be optimal due to an excessive volume of blood received in culture bottles   Culture  Setup Time   Final    AEROBIC BOTTLE ONLY GRAM  POSITIVE COCCI IN CLUSTERS CRITICAL RESULT CALLED TO, READ BACK BY AND VERIFIED WITH: J CARNEY PHARMD 09/07/19 1721 JDW    Culture (A)  Final    STAPHYLOCOCCUS SPECIES (COAGULASE NEGATIVE) THE SIGNIFICANCE OF ISOLATING THIS ORGANISM FROM A SINGLE SET OF BLOOD CULTURES WHEN MULTIPLE SETS ARE DRAWN IS UNCERTAIN. PLEASE NOTIFY THE MICROBIOLOGY DEPARTMENT WITHIN ONE WEEK IF SPECIATION AND SENSITIVITIES ARE REQUIRED. Performed at [facility], [place] [address] [place], [place] [address]    Report Status 09/08/2019 FINAL  Final  Urine culture     Status: Abnormal   Collection Time: 09/06/19 10:04 PM   Specimen: In/Out Cath Urine  Result Value Ref Range Status   Specimen Description IN/OUT CATH URINE  Final   Special Requests   Final    NONE Performed at [facility], [place] [address] [place], [place] [address]    Culture 1,000 COLONIES/mL ENTEROCOCCUS FAECALIS (A)  Final   Report Status 09/09/2019 FINAL  Final   Organism ID, Bacteria ENTEROCOCCUS FAECALIS (A)  Final      Susceptibility   Enterococcus faecalis - MIC*    AMPICILLIN <=2 SENSITIVE Sensitive     NITROFURANTOIN <=16 SENSITIVE Sensitive     VANCOMYCIN 1 SENSITIVE Sensitive     * 1,000 COLONIES/mL ENTEROCOCCUS FAECALIS  MRSA PCR Screening     Status: None  Collection Time: 09/08/19 11:39 AM   Specimen: Nasal Mucosa; Nasopharyngeal  Result Value Ref Range Status   MRSA by PCR NEGATIVE NEGATIVE Final    Comment:        The GeneXpert MRSA Assay (FDA approved for NASAL specimens only), is one component of a comprehensive MRSA colonization surveillance program. It is not intended to diagnose MRSA infection nor to guide or monitor treatment for MRSA infections. Performed at [facility], [place] [address] [place], [place] [address]   Culture, blood (routine x 2)     Status: None (Preliminary result)   Collection Time: 09/09/19  7:40 AM   Specimen: BLOOD RIGHT WRIST  Result Value Ref Range Status   Specimen  Description BLOOD RIGHT WRIST  Final   Special Requests   Final    BOTTLES DRAWN AEROBIC AND ANAEROBIC Blood Culture adequate volume   Culture   Final    NO GROWTH < 24 HOURS Performed at [facility], [place] [address], [place], [place] [address]    Report Status PENDING  Incomplete  Culture, blood (routine x 2)     Status: None (Preliminary result)   Collection Time: 09/09/19  7:51 AM   Specimen: BLOOD  Result Value Ref Range Status   Specimen Description BLOOD LEFT ANTECUBITAL  Final   Special Requests   Final    BOTTLES DRAWN AEROBIC AND ANAEROBIC Blood Culture adequate volume   Culture   Final    NO GROWTH < 24 HOURS Performed at [facility], [place] [address], [place], [place] [address]    Report Status PENDING  Incomplete    RADIOLOGY STUDIES/RESULTS: DG Swallowing Func-Speech Pathology  Result Date: 09/08/2019 Objective Swallowing Evaluation: Type of Study: MBS-Modified Barium Swallow Study  Patient Details Name: Leah Waller MRN: [id number] Date of Birth: [birth year] Today's Date: 09/08/2019 Time: SLP Start Time (ACUTE ONLY): 7322 -SLP Stop Time (ACUTE ONLY): 1438 SLP Time Calculation (min) (ACUTE ONLY): 21 min Past Medical History: Past Medical History: Diagnosis Date . Anxiety  . B12 deficiency  . Bipolar disorder ([place])  . Dementia ([place])  . Hypertension  . RBBB  Past Surgical History: No past surgical history on file. HPI: Pt is an 86 yo female admitted with AMS in the setting of AKI, hyponatremia, possible aspiration PNA, and concern for PE. Pt was recently admitted to [place] for COVID-19 2/17-2/23. PMH also includes: dementia, HTN, CKD stage III  Subjective: pt intermittently responding to questions or commands Assessment / Plan / Recommendation CHL IP CLINICAL IMPRESSIONS 09/08/2019 Clinical Impression Pt with history of dementia seen for MBS after treatment session concerning for aspiration. Moderate-severe oral and moderate pharyngeal dysphagia marked by inadequate  laryngeal elevation, pharyngeal contraction and tongue base retraction. Puree bolus held in oral cavity 3 minutes despite lingual pressure with dry spoon/additional volume presentations and ultimately required suctioning for first trial. Strength of swallow was reduced in particular her laryngeal elevation, pharyngeal contraction resulting in vallecular residue and laryngeal penetration. Barium adhered to pharyngeal wall and fell into vestibule, close to vocal cords. She sensed with slight throat clear and/or second swallow that only inconsistently removed penetrates. If pt consumed po's over the course of a meal the likelihood of aspiration is high. Given her dementia and age, long term alternative nutrition would not be in her best interests. Family may wish to pursue comfort feeds in which case Dys 1 (puree) and honey thick liquids. MD notified of recommendations and to speak with family. Continue  NPO status for now.    SLP Visit Diagnosis Dysphagia, oropharyngeal phase (R13.12) Attention and concentration deficit following -- Frontal lobe and executive function deficit following -- Impact on safety and function Moderate aspiration risk;Risk for inadequate nutrition/hydration   CHL IP TREATMENT RECOMMENDATION 09/08/2019 Treatment Recommendations Therapy as outlined in treatment plan below   Prognosis 09/08/2019 Prognosis for Safe Diet Advancement Fair Barriers to Reach Goals Cognitive deficits Barriers/Prognosis Comment -- CHL IP DIET RECOMMENDATION 09/08/2019 SLP Diet Recommendations NPO Liquid Administration via -- Medication Administration -- Compensations -- Postural Changes --   CHL IP OTHER RECOMMENDATIONS 09/08/2019 Recommended Consults -- Oral Care Recommendations Oral care QID Other Recommendations --   CHL IP FOLLOW UP RECOMMENDATIONS 09/08/2019 Follow up Recommendations Skilled Nursing facility   [facility] IP FREQUENCY AND DURATION 09/08/2019 Speech Therapy Frequency (ACUTE ONLY) min 1 x/week Treatment Duration 2  weeks      CHL IP ORAL PHASE 09/08/2019 Oral Phase Impaired Oral - Pudding Teaspoon -- Oral - Pudding Cup -- Oral - Honey Teaspoon -- Oral - Honey Cup Decreased bolus cohesion;Delayed oral transit;Other (Comment);Left pocketing in lateral sulci;Right pocketing in lateral sulci;Lingual/palatal residue;Holding of bolus;Reduced posterior propulsion Oral - Nectar Teaspoon -- Oral - Nectar Cup -- Oral - Nectar Straw -- Oral - Thin Teaspoon -- Oral - Thin Cup -- Oral - Thin Straw -- Oral - Puree Decreased bolus cohesion;Delayed oral transit;Other (Comment);Left pocketing in lateral sulci;Right pocketing in lateral sulci;Lingual/palatal residue;Holding of bolus;Reduced posterior propulsion Oral - Mech Soft -- Oral - Regular -- Oral - Multi-Consistency -- Oral - Pill -- Oral Phase - Comment --  CHL IP PHARYNGEAL PHASE 09/08/2019 Pharyngeal Phase Impaired Pharyngeal- Pudding Teaspoon -- Pharyngeal -- Pharyngeal- Pudding Cup -- Pharyngeal -- Pharyngeal- Honey Teaspoon -- Pharyngeal -- Pharyngeal- Honey Cup Reduced epiglottic inversion;Reduced laryngeal elevation;Penetration/Aspiration during swallow;Pharyngeal residue - valleculae;Reduced pharyngeal peristalsis;Pharyngeal residue - pyriform;Pharyngeal residue - posterior pharnyx Pharyngeal Material enters airway, remains ABOVE vocal cords and not ejected out Pharyngeal- Nectar Teaspoon -- Pharyngeal -- Pharyngeal- Nectar Cup -- Pharyngeal -- Pharyngeal- Nectar Straw -- Pharyngeal -- Pharyngeal- Thin Teaspoon -- Pharyngeal -- Pharyngeal- Thin Cup -- Pharyngeal -- Pharyngeal- Thin Straw -- Pharyngeal -- Pharyngeal- Puree Reduced pharyngeal peristalsis;Reduced epiglottic inversion;Reduced laryngeal elevation;Reduced airway/laryngeal closure;Pharyngeal residue - pyriform;Pharyngeal residue - valleculae;Pharyngeal residue - posterior pharnyx;Penetration/Apiration after swallow Pharyngeal Material enters airway, remains ABOVE vocal cords then ejected out Pharyngeal- Mechanical Soft  -- Pharyngeal -- Pharyngeal- Regular -- Pharyngeal -- Pharyngeal- Multi-consistency -- Pharyngeal -- Pharyngeal- Pill -- Pharyngeal -- Pharyngeal Comment --  CHL IP CERVICAL ESOPHAGEAL PHASE 09/08/2019 Cervical Esophageal Phase WFL Pudding Teaspoon -- Pudding Cup -- Honey Teaspoon -- Honey Cup -- Nectar Teaspoon -- Nectar Cup -- Nectar Straw -- Thin Teaspoon -- Thin Cup -- Thin Straw -- Puree -- Mechanical Soft -- Regular -- Multi-consistency -- Pill -- Cervical Esophageal Comment -- [place] Siren 09/08/2019, 3:52 PM Orbie Pyo Litaker M.Ed Actor Pager [phone] Office [phone]                LOS: 4 days   Oren Binet, MD  [facility]    To contact the attending provider between 7A-7P or the covering provider during after hours 7P-7A, please log into the web site [email or web address] and access using universal [place] password for that web site. If you do not have the password, please call the hospital operator.  09/10/2019, 1:19 PM

## 2019-09-10 NOTE — Progress Notes (Signed)
PT Cancellation Note  Patient Details Name: Leah Waller MRN: [id number] DOB: 1934/12/07   Cancelled Treatment:    Reason Eval/Treat Not Completed: Medical issues which prohibited therapy. Pt receiving Heparin at this time, on a therapy hold.   Jodelle Green, PT, DPT Acute Rehabilitation Services Office [phone]   Jodelle Green 09/10/2019, 1:31 PM

## 2019-09-10 NOTE — Progress Notes (Signed)
[place] for Heparin Indication: Possible PE   Allergies  Allergen Reactions  . Aspirin Other (See Comments)    Unspecified  . Lisinopril Other (See Comments)    Patient Measurements: Height: 5\' 3"  (160 cm) Weight: 151 lb 3.8 oz (68.6 kg) IBW/kg (Calculated) : 52.4 Heparin Dosing Weight: 66.4 kg  Vital Signs: Temp: 97.7 F (36.5 C) (03/12 0747) Temp Source: Oral (03/12 0747) BP: 144/52 (03/12 0747) Pulse Rate: 61 (03/12 0747)  Labs: Recent Labs    09/07/19 2326 09/08/19 0817 09/08/19 2356 09/09/19 0751 09/09/19 1200 09/10/19 0237  HGB 8.1*  --  7.3*  --   --  7.7*  HCT 27.6*  --  24.8*  --   --  25.1*  PLT 205  --  232  --   --  253  HEPARINUNFRC 0.43   < > 0.52  --  0.51 0.40  CREATININE  --    < > 1.97* 1.80*  --  1.59*   < > = values in this interval not displayed.    Estimated Creatinine Clearance: 24.9 mL/min (A) (by C-G formula based on SCr of 1.59 mg/dL (H)).   Medical History: Past Medical History:  Diagnosis Date  . Anxiety   . B12 deficiency   . Bipolar disorder ([place])   . Dementia ([place])   . Hypertension   . RBBB     Medications:  Scheduled:    Assessment: 44 yof recently diagnosed with COVID ~3 weeks ago presenting with new acute SOB with increasing O2 requirements. VQ scan with high probability for PE. LE dopplers negative for DVT. Pharmacy consulted to dose heparin. Patient is not on anticoagulation PTA. Noted AKI - SCr trending down.  Heparin level remains therapeutic at 1100 units/hr. No bleeding issues reported.  Goal of Therapy:  Heparin level 0.3-0.7 units/ml Monitor platelets by anticoagulation protocol: Yes   Plan:  Continue heparin at 1100 units/hr Monitor daily heparin level and CBC, s/sx bleeding   Alanda Slim, PharmD, [facility] Clinical Pharmacist Please see AMION for all Pharmacists' Contact Phone Numbers 09/10/2019, 9:40 AM

## 2019-09-10 NOTE — Progress Notes (Signed)
  Speech Language Pathology Treatment: Dysphagia  Patient Details Name: Leah Waller MRN: [id number] DOB: 1934/09/19 Today's Date: 09/10/2019 Time: 1350-1400 SLP Time Calculation (min) (ACUTE ONLY): 10 min  Assessment / Plan / Recommendation Clinical Impression  Upon entering room, pt was feeding herself dysphagia 1 diet, honey-thick liquids, albeit slowly but functionally.  She was self-limiting with small bites, answered yes/no questions and stated her meal "was good." There was consistent throat-clearing noted with meal, likely indicative of penetration of POs to the vocal folds per MBS review.  Recommend continuing current diet with known aspiration risks (per Dr. Nena Alexander conversation with family per 3/11 notes).  SLP to sign-off given plans for supportive care and understanding of current risks.  If pt demonstrates significant improvement over next few days, please re-consult and we can assess swallowing status.    HPI HPI: Pt is an 85 yo female admitted with AMS in the setting of AKI, hyponatremia, possible aspiration PNA, and concern for PE. Pt was recently admitted to [facility] for COVID-19 2/17-2/23. PMH also includes: dementia, HTN, CKD stage III      SLP Plan  Continue with current plan of care       Recommendations  Diet recommendations: Dysphagia 1 (puree);Honey-thick liquid Liquids provided via: Cup Supervision: Staff to assist with self feeding;Patient able to self feed Compensations: Minimize environmental distractions Postural Changes and/or Swallow Maneuvers: Seated upright 90 degrees                Oral Care Recommendations: Oral care BID Follow up Recommendations: Skilled Nursing facility SLP Visit Diagnosis: Dysphagia, oropharyngeal phase (R13.12) Plan: Continue with current plan of care       GO                Leah Waller 09/10/2019, 2:14 PM  Sherica Paternostro L. Tivis Ringer, [place] Office number [phone]

## 2019-09-10 NOTE — Discharge Instructions (Addendum)
Follow with Primary MD [facility], [facility] in 7 days   Get CBC, CMP, 2 view Chest X ray -  checked next visit within 1 week by Primary MD or SNF MD    Activity: As tolerated with Full fall precautions use walker/cane & assistance as needed  Disposition SNF  Diet: Soft diet - honey thick liquids with feeding assistance and aspiration precautions.   Special Instructions: If you have smoked or chewed Tobacco  in the last 2 yrs please stop smoking, stop any regular Alcohol  and or any Recreational drug use.  On your next visit with your primary care physician please Get Medicines reviewed and adjusted.  Please request your Prim.MD to go over all Hospital Tests and Procedure/Radiological results at the follow up, please get all Hospital records sent to your Prim MD by signing hospital release before you go home.  If you experience worsening of your admission symptoms, develop shortness of breath, life threatening emergency, suicidal or homicidal thoughts you must seek medical attention immediately by calling 911 or calling your MD immediately  if symptoms less severe.  You Must read complete instructions/literature along with all the possible adverse reactions/side effects for all the Medicines you take and that have been prescribed to you. Take any new Medicines after you have completely understood and accpet all the possible adverse reactions/side effects.       Information on my medicine - ELIQUIS (apixaban)  This medication education was reviewed with me or my healthcare representative as part of my discharge preparation.    Why was Eliquis prescribed for you? Eliquis was prescribed to treat blood clots that may have been found in the veins of your legs (deep vein thrombosis) or in your lungs (pulmonary embolism) and to reduce the risk of them occurring again.  What do You need to know about Eliquis ? The starting dose is 10 mg (two 5 mg tablets) taken TWICE daily for the  FIRST SEVEN (7) DAYS, then on (enter date)  3/15  the dose is reduced to ONE 5 mg tablet taken TWICE daily.  Eliquis may be taken with or without food.   Try to take the dose about the same time in the morning and in the evening. If you have difficulty swallowing the tablet whole please discuss with your pharmacist how to take the medication safely.  Take Eliquis exactly as prescribed and DO NOT stop taking Eliquis without talking to the doctor who prescribed the medication.  Stopping may increase your risk of developing a new blood clot.  Refill your prescription before you run out.  After discharge, you should have regular check-up appointments with your healthcare provider that is prescribing your Eliquis.    What do you do if you miss a dose? If a dose of ELIQUIS is not taken at the scheduled time, take it as soon as possible on the same day and twice-daily administration should be resumed. The dose should not be doubled to make up for a missed dose.  Important Safety Information A possible side effect of Eliquis is bleeding. You should call your healthcare provider right away if you experience any of the following: ? Bleeding from an injury or your nose that does not stop. ? Unusual colored urine (red or dark brown) or unusual colored stools (red or black). ? Unusual bruising for unknown reasons. ? A serious fall or if you hit your head (even if there is no bleeding).  Some medicines may interact with Eliquis and  might increase your risk of bleeding or clotting while on Eliquis. To help avoid this, consult your healthcare provider or pharmacist prior to using any new prescription or non-prescription medications, including herbals, vitamins, non-steroidal anti-inflammatory drugs (NSAIDs) and supplements.  This website has more information on Eliquis (apixaban): http://[email or web address].com/eliquis/home

## 2019-09-10 NOTE — Progress Notes (Addendum)
[place] for transition to Apixaban Indication: Pulmonary Embolus  Allergies  Allergen Reactions  . Aspirin Other (See Comments)    Unspecified  . Lisinopril Other (See Comments)    Patient Measurements: Height: 5\' 3"  (160 cm) Weight: 151 lb 3.8 oz (68.6 kg) IBW/kg (Calculated) : 52.4 Heparin Dosing Weight: 66.4 kg  Vital Signs: Temp: 97.9 F (36.6 C) (03/12 1231) Temp Source: Axillary (03/12 1231) BP: 137/61 (03/12 1231) Pulse Rate: 64 (03/12 1231)  Labs: Recent Labs    09/07/19 2326 09/08/19 0817 09/08/19 2356 09/09/19 0751 09/09/19 1200 09/10/19 0237  HGB 8.1*  --  7.3*  --   --  7.7*  HCT 27.6*  --  24.8*  --   --  25.1*  PLT 205  --  232  --   --  253  HEPARINUNFRC 0.43   < > 0.52  --  0.51 0.40  CREATININE  --    < > 1.97* 1.80*  --  1.59*   < > = values in this interval not displayed.    Estimated Creatinine Clearance: 24.9 mL/min (A) (by C-G formula based on SCr of 1.59 mg/dL (H)).   Medical History: Past Medical History:  Diagnosis Date  . Anxiety   . B12 deficiency   . Bipolar disorder ([place])   . Dementia ([place])   . Hypertension   . RBBB     Medications:  Scheduled:  . apixaban  10 mg Oral BID   Followed by  . [START ON 09/13/2019] apixaban  5 mg Oral BID    Assessment: 46 yof recently diagnosed with COVID ~3 weeks ago presenting with new acute SOB with increasing O2 requirements. VQ scan with high probability for PE. LE dopplers negative for DVT. Pharmacy consulted on 3/8 to dose heparin. Patient is not on anticoagulation PTA. Noted AKI - SCr trending down.  Patient has been therapeutic on heparin for 4 days, would load with apixaban for 3 more days at 10 mg po bid and then transition to apixaban maintenance dose of 5 mg bid.  Dose adjustments for age, weight and serum creatinine were only done in atrial fibrillation patients, so would not adjust in this patient with a PE.  Plan:  Start Apixaban 10 mg  po bid x 3 days at 2200, then decrease dose to 5 mg po bid. D/c heparin drip when first apixaban dose is given at 2200. Monitor for signs and symptoms of bleeding.   Alanda Slim, PharmD, [facility] Clinical Pharmacist Please see AMION for all Pharmacists' Contact Phone Numbers 09/10/2019, 1:41 PM

## 2019-09-11 LAB — CBC
HCT: 24.6 % — ABNORMAL LOW (ref 36.0–46.0)
Hemoglobin: 7.5 g/dL — ABNORMAL LOW (ref 12.0–15.0)
MCH: 30.1 pg (ref 26.0–34.0)
MCHC: 30.5 g/dL (ref 30.0–36.0)
MCV: 98.8 fL (ref 80.0–100.0)
Platelets: 295 10*3/uL (ref 150–400)
RBC: 2.49 MIL/uL — ABNORMAL LOW (ref 3.87–5.11)
RDW: 14.4 % (ref 11.5–15.5)
WBC: 9.5 10*3/uL (ref 4.0–10.5)
nRBC: 0.5 % — ABNORMAL HIGH (ref 0.0–0.2)

## 2019-09-11 LAB — BASIC METABOLIC PANEL
Anion gap: 9 (ref 5–15)
BUN: 33 mg/dL — ABNORMAL HIGH (ref 8–23)
CO2: 16 mmol/L — ABNORMAL LOW (ref 22–32)
Calcium: 8.1 mg/dL — ABNORMAL LOW (ref 8.9–10.3)
Chloride: 121 mmol/L — ABNORMAL HIGH (ref 98–111)
Creatinine, Ser: 1.33 mg/dL — ABNORMAL HIGH (ref 0.44–1.00)
GFR calc Af Amer: 43 mL/min — ABNORMAL LOW (ref >60)
GFR calc non Af Amer: 37 mL/min — ABNORMAL LOW (ref >60)
Glucose, Bld: 107 mg/dL — ABNORMAL HIGH (ref 70–99)
Potassium: 3.7 mmol/L (ref 3.5–5.1)
Sodium: 146 mmol/L — ABNORMAL HIGH (ref 135–145)

## 2019-09-11 LAB — CULTURE, BLOOD (ROUTINE X 2): Culture: NO GROWTH

## 2019-09-11 LAB — HEPARIN LEVEL (UNFRACTIONATED): Heparin Unfractionated: >2.2 IU/mL — ABNORMAL HIGH (ref 0.30–0.70)

## 2019-09-11 NOTE — Progress Notes (Signed)
PROGRESS NOTE        PATIENT DETAILS Name: Leah Waller Age: 85 y.o. Sex: female Date of Birth: [birth year] Admit Date: 09/06/2019 Admitting Physician Mckinley Jewel, MD OQH:[id number], [facility]  Brief Narrative: Patient is a 85 y.o. female with past medical history of dementia, HTN, CKD stage III-recent history of COVID-19 infection requiring hospitalization-presenting to the hospital with acute metabolic encephalopathy in the setting of AKI, hyponatremia and possible aspiration pneumonia.  Upon further imaging in the emergency room-likely thought to have pulmonary embolism as well.  See below for further details.  Significant events: 2/17-2/23>> admit to [facility] for [facility]-12 pneumonia-complicated by acute metabolic encephalopathy and bradycardia. 3/8>> admit to [facility] for encephalopathy due to AKI, hypernatremia, aspiration pneumonia and possible PE. 3/8>> VQ scan- high probability for PE-started on IV heparin  Antimicrobial therapy: Unasyn 3/12>>stop date 3/15 Cefepime: 3/8>>3/12 Vancomycin: 3/8>> 3/10.  Microbiology data: 3/11>> blood culture: No growth 3/8>> blood culture: 1/2+ for coag negative staph (likely contaminant) 3/8>> urine culture: 1000 colonies of Enterococcus (suspect contaminant)  Procedures : None  Consults: None  DVT Prophylaxis : Full dose anticoagulation with Heparin  Subjective: More talkative today-less confused-following simple commands.  Per RN-no major events overnight.  Assessment/Plan: Acute metabolic encephalopathy: Secondary to hypernatremia, AKI and probable aspiration pneumonia.  Overall more improved-remains relatively awake and alert-has baseline dementia and is otherwise pleasantly confused.  Continue supportive care.  CT head without acute findings on admission.  AKI a stage IIIa: Likely hemodynamically mediated-in the setting of poor oral intake-sepsis-and use of losartan.UA without significant  proteinuria.  AKI improving-continue supportive care.  Renal ultrasound without hydronephrosis.    Hypernatremia: Secondary to poor oral intake-improving-improved with gentle hydration.  KVO IVF-encourage oral intake-and repeat electrolytes tomorrow.  Acute hypoxemic respiratory failure: Suspect secondary to pulmonary embolism and likely aspiration pneumonia.  Hypoxia has resolved-stable on room air-with IV antibiotics and anticoagulation  Pulmonary embolism: Likely provoked by recent Covid infection-sedentary lifestyle.  VQ scan on 3/8-with high probability.  Lower extremity Dopplers negative for DVT.  Initially on IV heparin-has been transitioned to Eliquis (note on 3/12-risk/benefits of anticoagulation discussed with daughter over the phone)  Sepsis likely secondary to aspiration pneumonia: Sepsis physiology has resolved-cultures as above-transitioned to Unasyn  Dysphagia: Was pooling secretions on admission-much better.  SLP evaluation appreciated-spoke with daughter on 3/11-explained the risks of dysphagia 1 diet-family excepting risk of aspiration-started on dysphagia 1 diet.  Anemia: No evidence of blood loss-suspect has anemia secondary to chronic disease at baseline-worsened by acute illness and IV fluid dilution.  Follow for now.  Recent COVID-19 pneumonia with resultant debility/deconditioning and failure to thrive syndrome: Supportive care-PT/OT eval .  Does not have active Covid pneumonitis at this time.  Chronic combined systolic and diastolic heart failure (EF 45-50% by TTE on 08/20/2019): Volume status stable-follow closely while on IVF.  HTN: BP currently controlled-amlodipine, losartan on hold.  Dementia: Currently with encephalopathy-but at risk of delirium.  Will resume risperidone, Remeron  Large right renal cyst: Incidental finding on renal ultrasound-stable for outpatient follow-up.  Goals of care: Given patient's overall frailty-dementia-multiple chronic comorbidities  as outlined above-now acutely ill with AKI, hypernatremia, aspiration pneumonia and PE-she remains at risk for decompensation and further deterioration.  Patient's daughter is aware of the tenuous overall clinical situation-I have recommended that given overall poor prognosis-we make her a full DNR (golden  yellow rod/MOST form at present bedside).  Plan is to continue treatment of the acute issues to see if she recovers-if she deteriorates-daughter is aware that we may need to transition to hospice care.   Diet: Diet Order            DIET - DYS 1 Room service appropriate? Yes; Fluid consistency: Honey Thick  Diet effective now               Code Status:  DNR  Family Communication: Left a voicemail for daughter on 3/13  Disposition Plan: SNF likely early next week.  Barriers to Discharge: Sepsis secondary to aspiration pneumonia-on IV antibiotics, severe AKI and hypernatremia.  Antimicrobial agents: Anti-infectives (From admission, onward)   Start     Dose/Rate Route Frequency Ordered Stop   09/10/19 1600  Ampicillin-Sulbactam (UNASYN) 3 g in sodium chloride 0.9 % 100 mL IVPB     3 g 200 mL/hr over 30 Minutes Intravenous Every 12 hours 09/10/19 1119 09/13/19 0359   09/08/19 1400  vancomycin (VANCOREADY) IVPB 750 mg/150 mL  Status:  Discontinued     750 mg 150 mL/hr over 60 Minutes Intravenous Every 48 hours 09/08/19 1127 09/09/19 0706   09/07/19 1200  ceFEPIme (MAXIPIME) 2 g in sodium chloride 0.9 % 100 mL IVPB  Status:  Discontinued     2 g 200 mL/hr over 30 Minutes Intravenous Every 24 hours 09/06/19 1410 09/10/19 1119   09/06/19 1745  ceFEPIme (MAXIPIME) 2 g in sodium chloride 0.9 % 100 mL IVPB  Status:  Discontinued     2 g 200 mL/hr over 30 Minutes Intravenous  Once 09/06/19 1730 09/06/19 1747   09/06/19 1745  vancomycin (VANCOREADY) IVPB 1500 mg/300 mL  Status:  Discontinued     1,500 mg 150 mL/hr over 120 Minutes Intravenous  Once 09/06/19 1730 09/06/19 1747    09/06/19 1410  vancomycin variable dose per unstable renal function (pharmacist dosing)  Status:  Discontinued      Does not apply See admin instructions 09/06/19 1410 09/08/19 1127   09/06/19 1300  vancomycin (VANCOREADY) IVPB 1500 mg/300 mL     1,500 mg 150 mL/hr over 120 Minutes Intravenous  Once 09/06/19 1254 09/06/19 1617   09/06/19 1215  ceFEPIme (MAXIPIME) 2 g in sodium chloride 0.9 % 100 mL IVPB     2 g 200 mL/hr over 30 Minutes Intravenous  Once 09/06/19 1214 09/06/19 1409   09/06/19 1215  metroNIDAZOLE (FLAGYL) IVPB 500 mg     500 mg 100 mL/hr over 60 Minutes Intravenous  Once 09/06/19 1214 09/06/19 1459   09/06/19 1215  vancomycin (VANCOCIN) IVPB 1000 mg/200 mL premix  Status:  Discontinued     1,000 mg 200 mL/hr over 60 Minutes Intravenous  Once 09/06/19 1214 09/06/19 1254       Time spent: 25 minutes-Greater than 50% of this time was spent in counseling, explanation of diagnosis, planning of further management, and coordination of care.  MEDICATIONS: Scheduled Meds: . apixaban  10 mg Oral BID   Followed by  . [START ON 09/13/2019] apixaban  5 mg Oral BID   Continuous Infusions: . ampicillin-sulbactam (UNASYN) IV Stopped (09/11/19 0700)   PRN Meds:.acetaminophen **OR** acetaminophen, food thickener, hydrALAZINE, ipratropium-albuterol, ondansetron **OR** ondansetron (ZOFRAN) IV   PHYSICAL EXAM: Vital signs: Vitals:   09/10/19 2337 09/11/19 0304 09/11/19 0829 09/11/19 1221  BP: (!) 119/49 (!) 121/45 (!) 137/53 (!) 136/47  Pulse: 61 68 71 67  Resp: 18 20 19  18  Temp: 98.4 F (36.9 C) 98.3 F (36.8 C) 98.9 F (37.2 C) 98.7 F (37.1 C)  TempSrc: Oral Oral Oral Oral  SpO2: 100% 98% 99% 99%  Weight:      Height:       Filed Weights   09/06/19 1812  Weight: 68.6 kg   Body mass index is 26.79 kg/m.   Gen Exam:Alert awake-not in any distress HEENT:atraumatic, normocephalic Chest: B/L clear to auscultation anteriorly CVS:S1S2 regular Abdomen:soft non  tender, non distended Extremities:no edema Neurology: Non focal Skin: no rash  I have personally reviewed following labs and imaging studies  LABORATORY DATA: CBC: Recent Labs  Lab 09/06/19 1212 09/06/19 1302 09/07/19 0236 09/07/19 2326 09/08/19 2356 09/10/19 0237 09/11/19 0336  WBC 7.6   < > 5.6 6.5 7.0 8.2 9.5  NEUTROABS 6.1  --   --   --   --   --   --   HGB 9.8*   < > 8.8* 8.1* 7.3* 7.7* 7.5*  HCT 34.0*   < > 30.4* 27.6* 24.8* 25.1* 24.6*  MCV 106.6*   < > 107.0* 104.5* 103.8* 100.4* 98.8  PLT 232   < > 188 205 232 253 295   < > = values in this interval not displayed.    Basic Metabolic Panel: Recent Labs  Lab 09/06/19 1704 09/07/19 0236 09/08/19 0817 09/08/19 0817 09/08/19 1654 09/08/19 2356 09/09/19 0751 09/10/19 0237 09/11/19 0336  NA  --    < > 154*   < > 152* 150* 150* 148* 146*  K  --    < > 4.3  --   --  4.1 3.9 3.8 3.7  CL  --    < > 128*  --   --  128* 127* 125* 121*  CO2  --    < > 15*  --   --  13* 12* 13* 16*  GLUCOSE  --    < > 80  --   --  78 73 101* 107*  BUN  --    < > 70*  --   --  60* 56* 47* 33*  CREATININE 3.68*   < > 2.20*  --   --  1.97* 1.80* 1.59* 1.33*  CALCIUM  --    < > 7.9*  --   --  7.8* 7.7* 8.3* 8.1*  MG 2.9*  --   --   --   --   --   --   --   --   PHOS 5.8*  --   --   --   --   --   --   --   --    < > = values in this interval not displayed.    GFR: Estimated Creatinine Clearance: 29.8 mL/min (A) (by C-G formula based on SCr of 1.33 mg/dL (H)).  Liver Function Tests: Recent Labs  Lab 09/06/19 1212 09/07/19 0236 09/08/19 0817 09/08/19 2356  AST 54* 35 20 20  ALT 50* 40 27 26  ALKPHOS 110 84 76 74  BILITOT 1.8* 1.4* 1.0 1.0  PROT 7.4 6.3* 6.0* 5.3*  ALBUMIN 2.0* 1.8* 1.5* 1.3*   No results for input(s): LIPASE, AMYLASE in the last 168 hours. No results for input(s): AMMONIA in the last 168 hours.  Coagulation Profile: Recent Labs  Lab 09/06/19 1212  INR 1.4*    Cardiac Enzymes: No results for  input(s): CKTOTAL, CKMB, CKMBINDEX, TROPONINI in the last 168 hours.  BNP (last 3 results) No results  for input(s): PROBNP in the last 8760 hours.  Lipid Profile: No results for input(s): CHOL, HDL, LDLCALC, TRIG, CHOLHDL, LDLDIRECT in the last 72 hours.  Thyroid Function Tests: No results for input(s): TSH, T4TOTAL, FREET4, T3FREE, THYROIDAB in the last 72 hours.  Anemia Panel: No results for input(s): VITAMINB12, FOLATE, FERRITIN, TIBC, IRON, RETICCTPCT in the last 72 hours.  Urine analysis:    Component Value Date/Time   COLORURINE AMBER (A) 09/06/2019 2204   APPEARANCEUR CLOUDY (A) 09/06/2019 2204   LABSPEC 1.019 09/06/2019 2204   PHURINE 5.0 09/06/2019 2204   GLUCOSEU NEGATIVE 09/06/2019 2204   HGBUR SMALL (A) 09/06/2019 2204   BILIRUBINUR NEGATIVE 09/06/2019 [place] 09/06/2019 2204   PROTEINUR 30 (A) 09/06/2019 2204   NITRITE NEGATIVE 09/06/2019 2204   LEUKOCYTESUR SMALL (A) 09/06/2019 2204    Sepsis Labs: Lactic Acid, Venous    Component Value Date/Time   LATICACIDVEN 1.4 09/06/2019 1212    MICROBIOLOGY: Recent Results (from the past 240 hour(s))  Blood Culture (routine x 2)     Status: None   Collection Time: 09/06/19 12:55 PM   Specimen: BLOOD LEFT HAND  Result Value Ref Range Status   Specimen Description BLOOD LEFT HAND  Final   Special Requests   Final    BOTTLES DRAWN AEROBIC ONLY Blood Culture results may not be optimal due to an excessive volume of blood received in culture bottles   Culture   Final    NO GROWTH 5 DAYS Performed at [facility], [place] [address], [place], [place] [address]    Report Status 09/11/2019 FINAL  Final  Blood Culture (routine x 2)     Status: Abnormal   Collection Time: 09/06/19  1:05 PM   Specimen: BLOOD  Result Value Ref Range Status   Specimen Description BLOOD RIGHT ANTECUBITAL  Final   Special Requests   Final    BOTTLES DRAWN AEROBIC AND ANAEROBIC Blood Culture results may not be optimal  due to an excessive volume of blood received in culture bottles   Culture  Setup Time   Final    AEROBIC BOTTLE ONLY GRAM POSITIVE COCCI IN CLUSTERS CRITICAL RESULT CALLED TO, READ BACK BY AND VERIFIED WITH: J CARNEY PHARMD 09/07/19 1721 JDW    Culture (A)  Final    STAPHYLOCOCCUS SPECIES (COAGULASE NEGATIVE) THE SIGNIFICANCE OF ISOLATING THIS ORGANISM FROM A SINGLE SET OF BLOOD CULTURES WHEN MULTIPLE SETS ARE DRAWN IS UNCERTAIN. PLEASE NOTIFY THE MICROBIOLOGY DEPARTMENT WITHIN ONE WEEK IF SPECIATION AND SENSITIVITIES ARE REQUIRED. Performed at [facility], [place] [address] [place], [place] [address]    Report Status 09/08/2019 FINAL  Final  Urine culture     Status: Abnormal   Collection Time: 09/06/19 10:04 PM   Specimen: In/Out Cath Urine  Result Value Ref Range Status   Specimen Description IN/OUT CATH URINE  Final   Special Requests   Final    NONE Performed at [facility], [place] [address], [place], [place] [address]    Culture 1,000 COLONIES/mL ENTEROCOCCUS FAECALIS (A)  Final   Report Status 09/09/2019 FINAL  Final   Organism ID, Bacteria ENTEROCOCCUS FAECALIS (A)  Final      Susceptibility   Enterococcus faecalis - MIC*    AMPICILLIN <=2 SENSITIVE Sensitive     NITROFURANTOIN <=16 SENSITIVE Sensitive     VANCOMYCIN 1 SENSITIVE Sensitive     * 1,000 COLONIES/mL ENTEROCOCCUS FAECALIS  MRSA PCR Screening  Status: None   Collection Time: 09/08/19 11:39 AM   Specimen: Nasal Mucosa; Nasopharyngeal  Result Value Ref Range Status   MRSA by PCR NEGATIVE NEGATIVE Final    Comment:        The GeneXpert MRSA Assay (FDA approved for NASAL specimens only), is one component of a comprehensive MRSA colonization surveillance program. It is not intended to diagnose MRSA infection nor to guide or monitor treatment for MRSA infections. Performed at [facility], [place] [address], [place], [place] [address]   Culture, blood (routine x 2)     Status: None (Preliminary  result)   Collection Time: 09/09/19  7:40 AM   Specimen: BLOOD RIGHT WRIST  Result Value Ref Range Status   Specimen Description BLOOD RIGHT WRIST  Final   Special Requests   Final    BOTTLES DRAWN AEROBIC AND ANAEROBIC Blood Culture adequate volume   Culture   Final    NO GROWTH 2 DAYS Performed at [facility], [place] [address], [place], [place] [address]    Report Status PENDING  Incomplete  Culture, blood (routine x 2)     Status: None (Preliminary result)   Collection Time: 09/09/19  7:51 AM   Specimen: BLOOD  Result Value Ref Range Status   Specimen Description BLOOD LEFT ANTECUBITAL  Final   Special Requests   Final    BOTTLES DRAWN AEROBIC AND ANAEROBIC Blood Culture adequate volume   Culture   Final    NO GROWTH 2 DAYS Performed at [facility], [place] [address], [place], [place] [address]    Report Status PENDING  Incomplete    RADIOLOGY STUDIES/RESULTS: No results found.   LOS: 5 days   Oren Binet, MD  [facility]    To contact the attending provider between 7A-7P or the covering provider during after hours 7P-7A, please log into the web site [email or web address] and access using universal  password for that web site. If you do not have the password, please call the hospital operator.  09/11/2019, 1:24 PM

## 2019-09-12 DIAGNOSIS — R4182 Altered mental status, unspecified: Secondary | ICD-10-CM

## 2019-09-12 LAB — BASIC METABOLIC PANEL
Anion gap: 6 (ref 5–15)
BUN: 27 mg/dL — ABNORMAL HIGH (ref 8–23)
CO2: 16 mmol/L — ABNORMAL LOW (ref 22–32)
Calcium: 8.3 mg/dL — ABNORMAL LOW (ref 8.9–10.3)
Chloride: 124 mmol/L — ABNORMAL HIGH (ref 98–111)
Creatinine, Ser: 1.18 mg/dL — ABNORMAL HIGH (ref 0.44–1.00)
GFR calc Af Amer: 49 mL/min — ABNORMAL LOW (ref >60)
GFR calc non Af Amer: 43 mL/min — ABNORMAL LOW (ref >60)
Glucose, Bld: 123 mg/dL — ABNORMAL HIGH (ref 70–99)
Potassium: 3.7 mmol/L (ref 3.5–5.1)
Sodium: 146 mmol/L — ABNORMAL HIGH (ref 135–145)

## 2019-09-12 LAB — CBC
HCT: 24.9 % — ABNORMAL LOW (ref 36.0–46.0)
Hemoglobin: 7.7 g/dL — ABNORMAL LOW (ref 12.0–15.0)
MCH: 30.4 pg (ref 26.0–34.0)
MCHC: 30.9 g/dL (ref 30.0–36.0)
MCV: 98.4 fL (ref 80.0–100.0)
Platelets: 345 10*3/uL (ref 150–400)
RBC: 2.53 MIL/uL — ABNORMAL LOW (ref 3.87–5.11)
RDW: 14.5 % (ref 11.5–15.5)
WBC: 9.7 10*3/uL (ref 4.0–10.5)
nRBC: 0.8 % — ABNORMAL HIGH (ref 0.0–0.2)

## 2019-09-12 LAB — RETICULOCYTES
Immature Retic Fract: 28.6 % — ABNORMAL HIGH (ref 2.3–15.9)
RBC.: 2.7 MIL/uL — ABNORMAL LOW (ref 3.87–5.11)
Retic Count, Absolute: 35.9 10*3/uL (ref 19.0–186.0)
Retic Ct Pct: 1.3 % (ref 0.4–3.1)

## 2019-09-12 LAB — TYPE AND SCREEN
ABO/RH(D): A POS
Antibody Screen: NEGATIVE

## 2019-09-12 LAB — IRON AND TIBC
Iron: 27 ug/dL — ABNORMAL LOW (ref 28–170)
Saturation Ratios: 21 % (ref 10.4–31.8)
TIBC: 130 ug/dL — ABNORMAL LOW (ref 250–450)
UIBC: 103 ug/dL

## 2019-09-12 LAB — ABO/RH: ABO/RH(D): A POS

## 2019-09-12 LAB — FOLATE: Folate: 7.5 ng/mL (ref >5.9)

## 2019-09-12 LAB — FERRITIN: Ferritin: 288 ng/mL (ref 11–307)

## 2019-09-12 LAB — VITAMIN B12: Vitamin B-12: 2173 pg/mL — ABNORMAL HIGH (ref 180–914)

## 2019-09-12 MED ORDER — PANTOPRAZOLE SODIUM 40 MG PO TBEC
40.0000 mg | DELAYED_RELEASE_TABLET | Freq: Every day | ORAL | Status: DC
  Administered 2019-09-12 – 2019-09-13 (×2): 40 mg via ORAL
  Filled 2019-09-12 (×2): qty 1

## 2019-09-12 MED ORDER — DEXTROSE 5 % IV SOLN: INTRAVENOUS | Status: AC

## 2019-09-12 MED ORDER — DEXTROSE 5 % IV SOLN: INTRAVENOUS | Status: DC

## 2019-09-12 MED ORDER — FERROUS SULFATE 325 (65 FE) MG PO TABS
325.0000 mg | ORAL_TABLET | Freq: Three times a day (TID) | ORAL | Status: DC
  Administered 2019-09-12 – 2019-09-13 (×3): 325 mg via ORAL
  Filled 2019-09-12 (×2): qty 1

## 2019-09-12 NOTE — Progress Notes (Signed)
PROGRESS NOTE        PATIENT DETAILS Name: Leah Waller Age: 85 y.o. Sex: female Date of Birth: [birth year] Admit Date: 09/06/2019 Admitting Physician Mckinley Jewel, MD DJS:[id number], [employer]  Brief Narrative: Patient is a 85 y.o. female with past medical history of dementia, HTN, CKD stage III-recent history of COVID-19 infection requiring hospitalization-presenting to the hospital with acute metabolic encephalopathy in the setting of AKI, hyponatremia and possible aspiration pneumonia.  Upon further imaging in the emergency room-likely thought to have pulmonary embolism as well.  See below for further details.  Significant events: 2/17-2/23>> admit to [facility] for [id number] pneumonia-complicated by acute metabolic encephalopathy and bradycardia. 3/8>> admit to [facility] for encephalopathy due to AKI, hypernatremia, aspiration pneumonia and possible PE. 3/8>> VQ scan- high probability for PE-started on IV heparin  Antimicrobial therapy: Unasyn 3/12>>stop date 3/15 Cefepime: 3/8>>3/12 Vancomycin: 3/8>> 3/10.  Microbiology data: 3/11>> blood culture: No growth 3/8>> blood culture: 1/2+ for coag negative staph (likely contaminant) 3/8>> urine culture: 1000 colonies of Enterococcus (suspect contaminant)  Procedures : None  Consults: None  DVT Prophylaxis : Full dose anticoagulation with Heparin  Subjective:  Patient in bed, appears to be in no discomfort, denies any headache chest or abdominal pain.  Mildly confused.  Able to follow basic commands and answer basic questions.   Assessment/Plan:  Acute metabolic encephalopathy: Secondary to hypernatremia, AKI and probable aspiration pneumonia.  Overall more improved-remains relatively awake and alert-has baseline dementia and is otherwise pleasantly confused.  Continue supportive care.  CT head without acute findings on admission.  AKI a stage IIIa: Likely hemodynamically mediated-in the setting  of poor oral intake-sepsis-and use of losartan.UA without significant proteinuria.  AKI improving-continue supportive care.  Renal ultrasound without hydronephrosis.    Hypernatremia: Gently hydrate with D5W and repeat BMP in the morning.  Acute hypoxemic respiratory failure: Suspect secondary to pulmonary embolism and likely aspiration pneumonia.  Hypoxia has resolved-stable on room air-with IV antibiotics and anticoagulation  Pulmonary embolism: Likely provoked by recent Covid infection-sedentary lifestyle.  VQ scan on 3/8-with high probability.  Lower extremity Dopplers negative for DVT.  Initially on IV heparin-has been transitioned to Eliquis (note on 3/12-risk/benefits of anticoagulation discussed with daughter over the phone)  Sepsis likely secondary to aspiration pneumonia: Sepsis physiology has resolved-cultures as above-transitioned to Unasyn  Dysphagia: Was pooling secretions on admission-much better.  SLP evaluation appreciated-spoke with daughter on 3/11-explained the risks of dysphagia 1 diet-family excepting risk of aspiration-started on dysphagia 1 diet.  Anemia: Likely anemia of chronic disease, no evidence of ongoing bleeding, will check anemia panel, type screen, may fall below 7 after IV fluids.  Will transfuse if needed.  Recent COVID-19 pneumonia with resultant debility/deconditioning and failure to thrive syndrome: Supportive care-PT/OT eval .  Does not have active Covid pneumonitis at this time.  Chronic combined systolic and diastolic heart failure (EF 45-50% by TTE on 08/20/2019): Volume status stable-follow closely while on IVF.  HTN: BP currently controlled-amlodipine, losartan on hold.  Dementia: Currently with encephalopathy-but at risk of delirium.  Will resume risperidone, Remeron  Large right renal cyst: Incidental finding on renal ultrasound-stable for outpatient follow-up.  Goals of care: Given patient's overall frailty-dementia-multiple chronic comorbidities  as outlined above-now acutely ill with AKI, hypernatremia, aspiration pneumonia and PE-she remains at risk for decompensation and further deterioration.  Patient's daughter is aware of  the tenuous overall clinical situation-I have recommended that given overall poor prognosis-we make her a full DNR (golden yellow rod/MOST form at present bedside).  Plan is to continue treatment of the acute issues to see if she recovers-if she deteriorates-daughter is aware that we may need to transition to hospice care.   Diet:  Diet Order            DIET - DYS 1 Room service appropriate? Yes; Fluid consistency: Honey Thick  Diet effective now               Code Status:  DNR  Family Communication: Left a voicemail for daughter [name] on 09/12/19 @ 11.32 am  Disposition Plan: SNF likely early next week.  Barriers to Discharge: Sepsis secondary to aspiration pneumonia-on IV antibiotics, severe AKI and hypernatremia.  Antimicrobial agents: Anti-infectives (From admission, onward)   Start     Dose/Rate Route Frequency Ordered Stop   09/10/19 1600  Ampicillin-Sulbactam (UNASYN) 3 g in sodium chloride 0.9 % 100 mL IVPB     3 g 200 mL/hr over 30 Minutes Intravenous Every 12 hours 09/10/19 1119 09/13/19 0359   09/08/19 1400  vancomycin (VANCOREADY) IVPB 750 mg/150 mL  Status:  Discontinued     750 mg 150 mL/hr over 60 Minutes Intravenous Every 48 hours 09/08/19 1127 09/09/19 0706   09/07/19 1200  ceFEPIme (MAXIPIME) 2 g in sodium chloride 0.9 % 100 mL IVPB  Status:  Discontinued     2 g 200 mL/hr over 30 Minutes Intravenous Every 24 hours 09/06/19 1410 09/10/19 1119   09/06/19 1745  ceFEPIme (MAXIPIME) 2 g in sodium chloride 0.9 % 100 mL IVPB  Status:  Discontinued     2 g 200 mL/hr over 30 Minutes Intravenous  Once 09/06/19 1730 09/06/19 1747   09/06/19 1745  vancomycin (VANCOREADY) IVPB 1500 mg/300 mL  Status:  Discontinued     1,500 mg 150 mL/hr over 120 Minutes Intravenous  Once 09/06/19 1730  09/06/19 1747   09/06/19 1410  vancomycin variable dose per unstable renal function (pharmacist dosing)  Status:  Discontinued      Does not apply See admin instructions 09/06/19 1410 09/08/19 1127   09/06/19 1300  vancomycin (VANCOREADY) IVPB 1500 mg/300 mL     1,500 mg 150 mL/hr over 120 Minutes Intravenous  Once 09/06/19 1254 09/06/19 1617   09/06/19 1215  ceFEPIme (MAXIPIME) 2 g in sodium chloride 0.9 % 100 mL IVPB     2 g 200 mL/hr over 30 Minutes Intravenous  Once 09/06/19 1214 09/06/19 1409   09/06/19 1215  metroNIDAZOLE (FLAGYL) IVPB 500 mg     500 mg 100 mL/hr over 60 Minutes Intravenous  Once 09/06/19 1214 09/06/19 1459   09/06/19 1215  vancomycin (VANCOCIN) IVPB 1000 mg/200 mL premix  Status:  Discontinued     1,000 mg 200 mL/hr over 60 Minutes Intravenous  Once 09/06/19 1214 09/06/19 1254       Time spent: 25 minutes-Greater than 50% of this time was spent in counseling, explanation of diagnosis, planning of further management, and coordination of care.  MEDICATIONS: Scheduled Meds: . apixaban  10 mg Oral BID   Followed by  . [START ON 09/13/2019] apixaban  5 mg Oral BID   Continuous Infusions: . ampicillin-sulbactam (UNASYN) IV 3 g (09/12/19 0405)  . dextrose     PRN Meds:.acetaminophen **OR** [DISCONTINUED] acetaminophen, food thickener, hydrALAZINE, ipratropium-albuterol, [DISCONTINUED] ondansetron **OR** ondansetron (ZOFRAN) IV   PHYSICAL EXAM: Vital signs: Vitals:  09/11/19 2000 09/12/19 0000 09/12/19 0400 09/12/19 0836  BP: (!) 143/79 (!) 109/43 (!) 155/61 103/81  Pulse:    75  Resp:    18  Temp: 99.2 F (37.3 C) 97.9 F (36.6 C) 98.8 F (37.1 C) 98.8 F (37.1 C)  TempSrc: Oral Oral Oral Oral  SpO2:    100%  Weight:      Height:       Filed Weights   09/06/19 1812  Weight: 68.6 kg   Body mass index is 26.79 kg/m.   Gen Exam:  Awake, pleasantly confused, No new F.N deficits,   [place].AT,PERRAL Supple Neck,No JVD, No cervical lymphadenopathy  appriciated.  Symmetrical Chest wall movement, Good air movement bilaterally, CTAB RRR,No Gallops, Rubs or new Murmurs, No Parasternal Heave +ve B.Sounds, Abd Soft, No tenderness, No organomegaly appriciated, No rebound - guarding or rigidity. No Cyanosis, Clubbing or edema, No new Rash or bruise   I have personally reviewed following labs and imaging studies  LABORATORY DATA: CBC: Recent Labs  Lab 09/06/19 1212 09/06/19 1302 09/07/19 2326 09/08/19 2356 09/10/19 0237 09/11/19 0336 09/12/19 0132  WBC 7.6   < > 6.5 7.0 8.2 9.5 9.7  NEUTROABS 6.1  --   --   --   --   --   --   HGB 9.8*   < > 8.1* 7.3* 7.7* 7.5* 7.7*  HCT 34.0*   < > 27.6* 24.8* 25.1* 24.6* 24.9*  MCV 106.6*   < > 104.5* 103.8* 100.4* 98.8 98.4  PLT 232   < > 205 232 253 295 345   < > = values in this interval not displayed.    Basic Metabolic Panel: Recent Labs  Lab 09/06/19 1704 09/07/19 0236 09/08/19 2356 09/09/19 0751 09/10/19 0237 09/11/19 0336 09/12/19 0132  NA  --    < > 150* 150* 148* 146* 146*  K  --    < > 4.1 3.9 3.8 3.7 3.7  CL  --    < > 128* 127* 125* 121* 124*  CO2  --    < > 13* 12* 13* 16* 16*  GLUCOSE  --    < > 78 73 101* 107* 123*  BUN  --    < > 60* 56* 47* 33* 27*  CREATININE 3.68*   < > 1.97* 1.80* 1.59* 1.33* 1.18*  CALCIUM  --    < > 7.8* 7.7* 8.3* 8.1* 8.3*  MG 2.9*  --   --   --   --   --   --   PHOS 5.8*  --   --   --   --   --   --    < > = values in this interval not displayed.    GFR: Estimated Creatinine Clearance: 33.6 mL/min (A) (by C-G formula based on SCr of 1.18 mg/dL (H)).  Liver Function Tests: Recent Labs  Lab 09/06/19 1212 09/07/19 0236 09/08/19 0817 09/08/19 2356  AST 54* 35 20 20  ALT 50* 40 27 26  ALKPHOS 110 84 76 74  BILITOT 1.8* 1.4* 1.0 1.0  PROT 7.4 6.3* 6.0* 5.3*  ALBUMIN 2.0* 1.8* 1.5* 1.3*   No results for input(s): LIPASE, AMYLASE in the last 168 hours. No results for input(s): AMMONIA in the last 168 hours.  Coagulation  Profile: Recent Labs  Lab 09/06/19 1212  INR 1.4*    Cardiac Enzymes: No results for input(s): CKTOTAL, CKMB, CKMBINDEX, TROPONINI in the last 168 hours.  BNP (last 3  results) No results for input(s): PROBNP in the last 8760 hours.  Lipid Profile: No results for input(s): CHOL, HDL, LDLCALC, TRIG, CHOLHDL, LDLDIRECT in the last 72 hours.  Thyroid Function Tests: No results for input(s): TSH, T4TOTAL, FREET4, T3FREE, THYROIDAB in the last 72 hours.  Anemia Panel: No results for input(s): VITAMINB12, FOLATE, FERRITIN, TIBC, IRON, RETICCTPCT in the last 72 hours.  Urine analysis:    Component Value Date/Time   COLORURINE AMBER (A) 09/06/2019 2204   APPEARANCEUR CLOUDY (A) 09/06/2019 2204   LABSPEC 1.019 09/06/2019 2204   PHURINE 5.0 09/06/2019 2204   GLUCOSEU NEGATIVE 09/06/2019 2204   HGBUR SMALL (A) 09/06/2019 2204   BILIRUBINUR NEGATIVE 09/06/2019 [place] 09/06/2019 2204   PROTEINUR 30 (A) 09/06/2019 2204   NITRITE NEGATIVE 09/06/2019 2204   LEUKOCYTESUR SMALL (A) 09/06/2019 2204    Sepsis Labs: Lactic Acid, Venous    Component Value Date/Time   LATICACIDVEN 1.4 09/06/2019 1212    MICROBIOLOGY: Recent Results (from the past 240 hour(s))  Blood Culture (routine x 2)     Status: None   Collection Time: 09/06/19 12:55 PM   Specimen: BLOOD LEFT HAND  Result Value Ref Range Status   Specimen Description BLOOD LEFT HAND  Final   Special Requests   Final    BOTTLES DRAWN AEROBIC ONLY Blood Culture results may not be optimal due to an excessive volume of blood received in culture bottles   Culture   Final    NO GROWTH 5 DAYS Performed at [facility], [place] [address] [place], [place] [address]    Report Status 09/11/2019 FINAL  Final  Blood Culture (routine x 2)     Status: Abnormal   Collection Time: 09/06/19  1:05 PM   Specimen: BLOOD  Result Value Ref Range Status   Specimen Description BLOOD RIGHT ANTECUBITAL  Final   Special  Requests   Final    BOTTLES DRAWN AEROBIC AND ANAEROBIC Blood Culture results may not be optimal due to an excessive volume of blood received in culture bottles   Culture  Setup Time   Final    AEROBIC BOTTLE ONLY GRAM POSITIVE COCCI IN CLUSTERS CRITICAL RESULT CALLED TO, READ BACK BY AND VERIFIED WITH: J CARNEY PHARMD 09/07/19 1721 JDW    Culture (A)  Final    STAPHYLOCOCCUS SPECIES (COAGULASE NEGATIVE) THE SIGNIFICANCE OF ISOLATING THIS ORGANISM FROM A SINGLE SET OF BLOOD CULTURES WHEN MULTIPLE SETS ARE DRAWN IS UNCERTAIN. PLEASE NOTIFY THE MICROBIOLOGY DEPARTMENT WITHIN ONE WEEK IF SPECIATION AND SENSITIVITIES ARE REQUIRED. Performed at [facility], [place] [address], [place], [place] [address]    Report Status 09/08/2019 FINAL  Final  Urine culture     Status: Abnormal   Collection Time: 09/06/19 10:04 PM   Specimen: In/Out Cath Urine  Result Value Ref Range Status   Specimen Description IN/OUT CATH URINE  Final   Special Requests   Final    NONE Performed at [facility], [place] [address] [place], [place] [address]    Culture 1,000 COLONIES/mL ENTEROCOCCUS FAECALIS (A)  Final   Report Status 09/09/2019 FINAL  Final   Organism ID, Bacteria ENTEROCOCCUS FAECALIS (A)  Final      Susceptibility   Enterococcus faecalis - MIC*    AMPICILLIN <=2 SENSITIVE Sensitive     NITROFURANTOIN <=16 SENSITIVE Sensitive     VANCOMYCIN 1 SENSITIVE Sensitive     * 1,000 COLONIES/mL ENTEROCOCCUS FAECALIS  MRSA PCR Screening  Status: None   Collection Time: 09/08/19 11:39 AM   Specimen: Nasal Mucosa; Nasopharyngeal  Result Value Ref Range Status   MRSA by PCR NEGATIVE NEGATIVE Final    Comment:        The GeneXpert MRSA Assay (FDA approved for NASAL specimens only), is one component of a comprehensive MRSA colonization surveillance program. It is not intended to diagnose MRSA infection nor to guide or monitor treatment for MRSA infections. Performed at [facility],  [place] [address], [place], [place] [address]   Culture, blood (routine x 2)     Status: None (Preliminary result)   Collection Time: 09/09/19  7:40 AM   Specimen: BLOOD RIGHT WRIST  Result Value Ref Range Status   Specimen Description BLOOD RIGHT WRIST  Final   Special Requests   Final    BOTTLES DRAWN AEROBIC AND ANAEROBIC Blood Culture adequate volume   Culture   Final    NO GROWTH 2 DAYS Performed at [facility], [place] [address], [place], [place] [address]    Report Status PENDING  Incomplete  Culture, blood (routine x 2)     Status: None (Preliminary result)   Collection Time: 09/09/19  7:51 AM   Specimen: BLOOD  Result Value Ref Range Status   Specimen Description BLOOD LEFT ANTECUBITAL  Final   Special Requests   Final    BOTTLES DRAWN AEROBIC AND ANAEROBIC Blood Culture adequate volume   Culture   Final    NO GROWTH 2 DAYS Performed at [facility], [place] [address], [place],  [address]    Report Status PENDING  Incomplete    RADIOLOGY STUDIES/RESULTS: No results found.   LOS: 6 days   Signature  Lala Lund M.D on 09/12/2019 at 11:32 AM   -  To page go to [email or web address]

## 2019-09-13 DIAGNOSIS — G9341 Metabolic encephalopathy: Secondary | ICD-10-CM | POA: Diagnosis not present

## 2019-09-13 DIAGNOSIS — E872 Acidosis: Secondary | ICD-10-CM | POA: Diagnosis not present

## 2019-09-13 DIAGNOSIS — M25571 Pain in right ankle and joints of right foot: Secondary | ICD-10-CM | POA: Diagnosis not present

## 2019-09-13 DIAGNOSIS — U071 COVID-19: Secondary | ICD-10-CM | POA: Diagnosis not present

## 2019-09-13 DIAGNOSIS — I130 Hypertensive heart and chronic kidney disease with heart failure and stage 1 through stage 4 chronic kidney disease, or unspecified chronic kidney disease: Secondary | ICD-10-CM | POA: Diagnosis not present

## 2019-09-13 DIAGNOSIS — I5042 Chronic combined systolic (congestive) and diastolic (congestive) heart failure: Secondary | ICD-10-CM | POA: Diagnosis not present

## 2019-09-13 DIAGNOSIS — R32 Unspecified urinary incontinence: Secondary | ICD-10-CM | POA: Diagnosis present

## 2019-09-13 DIAGNOSIS — R6520 Severe sepsis without septic shock: Secondary | ICD-10-CM | POA: Diagnosis not present

## 2019-09-13 DIAGNOSIS — M25561 Pain in right knee: Secondary | ICD-10-CM | POA: Diagnosis not present

## 2019-09-13 DIAGNOSIS — W19XXXA Unspecified fall, initial encounter: Secondary | ICD-10-CM | POA: Diagnosis not present

## 2019-09-13 DIAGNOSIS — F319 Bipolar disorder, unspecified: Secondary | ICD-10-CM | POA: Diagnosis not present

## 2019-09-13 DIAGNOSIS — R1312 Dysphagia, oropharyngeal phase: Secondary | ICD-10-CM | POA: Diagnosis not present

## 2019-09-13 DIAGNOSIS — J189 Pneumonia, unspecified organism: Secondary | ICD-10-CM | POA: Diagnosis not present

## 2019-09-13 DIAGNOSIS — Z743 Need for continuous supervision: Secondary | ICD-10-CM | POA: Diagnosis not present

## 2019-09-13 DIAGNOSIS — M6281 Muscle weakness (generalized): Secondary | ICD-10-CM | POA: Diagnosis not present

## 2019-09-13 DIAGNOSIS — M6389 Disorders of muscle in diseases classified elsewhere, multiple sites: Secondary | ICD-10-CM | POA: Diagnosis not present

## 2019-09-13 DIAGNOSIS — L89620 Pressure ulcer of left heel, unstageable: Secondary | ICD-10-CM | POA: Diagnosis not present

## 2019-09-13 DIAGNOSIS — M79661 Pain in right lower leg: Secondary | ICD-10-CM | POA: Diagnosis not present

## 2019-09-13 DIAGNOSIS — I4510 Unspecified right bundle-branch block: Secondary | ICD-10-CM | POA: Diagnosis present

## 2019-09-13 DIAGNOSIS — R001 Bradycardia, unspecified: Secondary | ICD-10-CM | POA: Diagnosis not present

## 2019-09-13 DIAGNOSIS — Z515 Encounter for palliative care: Secondary | ICD-10-CM | POA: Diagnosis not present

## 2019-09-13 DIAGNOSIS — E875 Hyperkalemia: Secondary | ICD-10-CM | POA: Diagnosis not present

## 2019-09-13 DIAGNOSIS — R159 Full incontinence of feces: Secondary | ICD-10-CM | POA: Diagnosis present

## 2019-09-13 DIAGNOSIS — G309 Alzheimer's disease, unspecified: Secondary | ICD-10-CM | POA: Diagnosis not present

## 2019-09-13 DIAGNOSIS — F0390 Unspecified dementia without behavioral disturbance: Secondary | ICD-10-CM | POA: Diagnosis not present

## 2019-09-13 DIAGNOSIS — D631 Anemia in chronic kidney disease: Secondary | ICD-10-CM | POA: Diagnosis present

## 2019-09-13 DIAGNOSIS — N184 Chronic kidney disease, stage 4 (severe): Secondary | ICD-10-CM | POA: Diagnosis present

## 2019-09-13 DIAGNOSIS — Z66 Do not resuscitate: Secondary | ICD-10-CM | POA: Diagnosis not present

## 2019-09-13 DIAGNOSIS — R296 Repeated falls: Secondary | ICD-10-CM | POA: Diagnosis not present

## 2019-09-13 DIAGNOSIS — R279 Unspecified lack of coordination: Secondary | ICD-10-CM | POA: Diagnosis not present

## 2019-09-13 DIAGNOSIS — Z9181 History of falling: Secondary | ICD-10-CM | POA: Diagnosis not present

## 2019-09-13 DIAGNOSIS — R4182 Altered mental status, unspecified: Secondary | ICD-10-CM | POA: Diagnosis not present

## 2019-09-13 DIAGNOSIS — I4430 Unspecified atrioventricular block: Secondary | ICD-10-CM | POA: Diagnosis not present

## 2019-09-13 DIAGNOSIS — I2699 Other pulmonary embolism without acute cor pulmonale: Secondary | ICD-10-CM | POA: Diagnosis not present

## 2019-09-13 DIAGNOSIS — R1310 Dysphagia, unspecified: Secondary | ICD-10-CM | POA: Diagnosis not present

## 2019-09-13 DIAGNOSIS — W06XXXA Fall from bed, initial encounter: Secondary | ICD-10-CM | POA: Diagnosis not present

## 2019-09-13 DIAGNOSIS — I959 Hypotension, unspecified: Secondary | ICD-10-CM | POA: Diagnosis not present

## 2019-09-13 DIAGNOSIS — R0602 Shortness of breath: Secondary | ICD-10-CM | POA: Diagnosis not present

## 2019-09-13 DIAGNOSIS — D649 Anemia, unspecified: Secondary | ICD-10-CM | POA: Diagnosis not present

## 2019-09-13 DIAGNOSIS — R638 Other symptoms and signs concerning food and fluid intake: Secondary | ICD-10-CM | POA: Diagnosis not present

## 2019-09-13 DIAGNOSIS — E538 Deficiency of other specified B group vitamins: Secondary | ICD-10-CM | POA: Diagnosis present

## 2019-09-13 DIAGNOSIS — Y92129 Unspecified place in nursing home as the place of occurrence of the external cause: Secondary | ICD-10-CM | POA: Diagnosis not present

## 2019-09-13 DIAGNOSIS — Z7401 Bed confinement status: Secondary | ICD-10-CM | POA: Diagnosis not present

## 2019-09-13 DIAGNOSIS — I440 Atrioventricular block, first degree: Secondary | ICD-10-CM | POA: Diagnosis not present

## 2019-09-13 DIAGNOSIS — F329 Major depressive disorder, single episode, unspecified: Secondary | ICD-10-CM | POA: Diagnosis not present

## 2019-09-13 DIAGNOSIS — J9601 Acute respiratory failure with hypoxia: Secondary | ICD-10-CM | POA: Diagnosis not present

## 2019-09-13 DIAGNOSIS — E870 Hyperosmolality and hypernatremia: Secondary | ICD-10-CM | POA: Diagnosis not present

## 2019-09-13 DIAGNOSIS — N1831 Chronic kidney disease, stage 3a: Secondary | ICD-10-CM | POA: Diagnosis not present

## 2019-09-13 DIAGNOSIS — Z789 Other specified health status: Secondary | ICD-10-CM | POA: Diagnosis not present

## 2019-09-13 DIAGNOSIS — Z20822 Contact with and (suspected) exposure to covid-19: Secondary | ICD-10-CM | POA: Diagnosis not present

## 2019-09-13 DIAGNOSIS — R630 Anorexia: Secondary | ICD-10-CM | POA: Diagnosis not present

## 2019-09-13 DIAGNOSIS — R7981 Abnormal blood-gas level: Secondary | ICD-10-CM | POA: Diagnosis not present

## 2019-09-13 DIAGNOSIS — N179 Acute kidney failure, unspecified: Secondary | ICD-10-CM | POA: Diagnosis not present

## 2019-09-13 DIAGNOSIS — R531 Weakness: Secondary | ICD-10-CM | POA: Diagnosis not present

## 2019-09-13 DIAGNOSIS — R262 Difficulty in walking, not elsewhere classified: Secondary | ICD-10-CM | POA: Diagnosis not present

## 2019-09-13 DIAGNOSIS — J690 Pneumonitis due to inhalation of food and vomit: Secondary | ICD-10-CM | POA: Diagnosis not present

## 2019-09-13 DIAGNOSIS — R0682 Tachypnea, not elsewhere classified: Secondary | ICD-10-CM | POA: Diagnosis not present

## 2019-09-13 DIAGNOSIS — R05 Cough: Secondary | ICD-10-CM | POA: Diagnosis not present

## 2019-09-13 DIAGNOSIS — R4189 Other symptoms and signs involving cognitive functions and awareness: Secondary | ICD-10-CM | POA: Diagnosis not present

## 2019-09-13 DIAGNOSIS — J449 Chronic obstructive pulmonary disease, unspecified: Secondary | ICD-10-CM | POA: Diagnosis not present

## 2019-09-13 DIAGNOSIS — R1110 Vomiting, unspecified: Secondary | ICD-10-CM | POA: Diagnosis not present

## 2019-09-13 DIAGNOSIS — R400 Somnolence: Secondary | ICD-10-CM | POA: Diagnosis not present

## 2019-09-13 DIAGNOSIS — K921 Melena: Secondary | ICD-10-CM | POA: Diagnosis not present

## 2019-09-13 DIAGNOSIS — F29 Unspecified psychosis not due to a substance or known physiological condition: Secondary | ICD-10-CM | POA: Diagnosis not present

## 2019-09-13 DIAGNOSIS — I10 Essential (primary) hypertension: Secondary | ICD-10-CM | POA: Diagnosis not present

## 2019-09-13 DIAGNOSIS — F419 Anxiety disorder, unspecified: Secondary | ICD-10-CM | POA: Diagnosis not present

## 2019-09-13 DIAGNOSIS — S0083XA Contusion of other part of head, initial encounter: Secondary | ICD-10-CM | POA: Diagnosis present

## 2019-09-13 LAB — CBC WITH DIFFERENTIAL/PLATELET
Abs Immature Granulocytes: 2.07 10*3/uL — ABNORMAL HIGH (ref 0.00–0.07)
Basophils Absolute: 0 10*3/uL (ref 0.0–0.1)
Basophils Relative: 0 %
Eosinophils Absolute: 0.2 10*3/uL (ref 0.0–0.5)
Eosinophils Relative: 2 %
HCT: 24.8 % — ABNORMAL LOW (ref 36.0–46.0)
Hemoglobin: 7.7 g/dL — ABNORMAL LOW (ref 12.0–15.0)
Immature Granulocytes: 22 %
Lymphocytes Relative: 15 %
Lymphs Abs: 1.4 10*3/uL (ref 0.7–4.0)
MCH: 30.6 pg (ref 26.0–34.0)
MCHC: 31 g/dL (ref 30.0–36.0)
MCV: 98.4 fL (ref 80.0–100.0)
Monocytes Absolute: 1.1 10*3/uL — ABNORMAL HIGH (ref 0.1–1.0)
Monocytes Relative: 12 %
Neutro Abs: 4.6 10*3/uL (ref 1.7–7.7)
Neutrophils Relative %: 49 %
Platelets: 358 10*3/uL (ref 150–400)
RBC: 2.52 MIL/uL — ABNORMAL LOW (ref 3.87–5.11)
RDW: 14.5 % (ref 11.5–15.5)
WBC: 9.4 10*3/uL (ref 4.0–10.5)
nRBC: 0.9 % — ABNORMAL HIGH (ref 0.0–0.2)

## 2019-09-13 LAB — MAGNESIUM: Magnesium: 1.7 mg/dL (ref 1.7–2.4)

## 2019-09-13 LAB — BRAIN NATRIURETIC PEPTIDE: B Natriuretic Peptide: 184.3 pg/mL — ABNORMAL HIGH (ref 0.0–100.0)

## 2019-09-13 MED ORDER — APIXABAN 5 MG PO TABS: 5.0000 mg | ORAL_TABLET | Freq: Two times a day (BID) | ORAL | Status: DC

## 2019-09-13 MED ORDER — FUROSEMIDE 40 MG PO TABS
40.0000 mg | ORAL_TABLET | Freq: Once | ORAL | Status: AC
  Administered 2019-09-13: 40 mg via ORAL
  Filled 2019-09-13: qty 1

## 2019-09-13 MED ORDER — PANTOPRAZOLE SODIUM 40 MG PO TBEC: 40.0000 mg | DELAYED_RELEASE_TABLET | Freq: Every day | ORAL | Status: DC

## 2019-09-13 MED ORDER — POTASSIUM CHLORIDE 20 MEQ PO PACK
20.0000 meq | PACK | Freq: Once | ORAL | Status: AC
  Administered 2019-09-13: 20 meq via ORAL
  Filled 2019-09-13 (×2): qty 1

## 2019-09-13 NOTE — Progress Notes (Signed)
[facility] for transition to Apixaban Indication: Pulmonary Embolus  Allergies  Allergen Reactions  . Aspirin Other (See Comments)    Unspecified  . Lisinopril Other (See Comments)    Patient Measurements: Height: 5\' 3"  (160 cm) Weight: 153 lb 3.5 oz (69.5 kg) IBW/kg (Calculated) : 52.4 Heparin Dosing Weight: 66.4 kg  Vital Signs: Temp: 98.2 F (36.8 C) (03/15 0324) Temp Source: Oral (03/15 0324) BP: 145/50 (03/15 0324) Pulse Rate: 69 (03/15 0324)  Labs: Recent Labs    09/11/19 0336 09/11/19 0336 09/12/19 0132 09/13/19 0333  HGB 7.5*   < > 7.7* 7.7*  HCT 24.6*  --  24.9* 24.8*  PLT 295  --  345 358  HEPARINUNFRC >2.20*  --   --   --   CREATININE 1.33*  --  1.18*  --    < > = values in this interval not displayed.    Estimated Creatinine Clearance: 33.8 mL/min (A) (by C-G formula based on SCr of 1.18 mg/dL (H)).   Medical History: Past Medical History:  Diagnosis Date  . Anxiety   . B12 deficiency   . Bipolar disorder ([place])   . Dementia ([place])   . Hypertension   . RBBB     Medications:  Scheduled:  . apixaban  5 mg Oral BID  . ferrous sulfate  325 mg Oral TID WC  . pantoprazole  40 mg Oral Daily    Assessment: 16 yof recently diagnosed with COVID ~3 weeks ago presenting with new acute SOB with increasing O2 requirements. VQ scan with high probability for PE. LE dopplers negative for DVT. Pharmacy consulted on 3/8 to dose heparin. Patient is not on anticoagulation PTA. Noted AKI - SCr trending down.  Patient has been therapeutic on heparin for 4 days, would load with apixaban for 3 more days at 10 mg po bid and then transition to apixaban maintenance dose of 5 mg bid.  Dose adjustments for age, weight and serum creatinine were only done in atrial fibrillation patients, so would not adjust in this patient with a PE.  Continue apixaban as plan. No issue.   Plan:  Apixaban 5 mg po bid. Monitor for signs and symptoms  of bleeding.   Onnie Boer, PharmD, BCIDP, AAHIVP, CPP Infectious Disease Pharmacist 09/13/2019 1:12 PM

## 2019-09-13 NOTE — Discharge Summary (Signed)
Physician Discharge Summary  Leah Waller TIW:[id number] DOB: [birth year] DOA: 08/17/2019  PCP: [facility]  Admit date: 08/17/2019 Discharge date: 08/18/2019  Recommendations for Outpatient Follow-up:  none  Home Health: none Equipment/Devices:none    Discharge Condition: no significant change CODE STATUS: DNR Diet recommendation: NPO now  Brief/Interim Summary (HPI)  Leah Waller is a 85 y.o. female with medical history significant of hypertension, dementia, bipolar disorder, iron deficiency anemia, who presents with fall and weakness.  Patient has dementia, and is unable to provide accurate medical history. I called her daughter who also dose not know what happened to patient in detail since that she is not living with patient, therefore, most of the history is obtained by discussing the case with ED physician, per EMS report, and with the nursing staff.  Per report, pt was found to have decreased level of alertness, generalized weakness.  Normally she is ambulatory and conversant, however at bedside notes that patient has been more confused and somnolent for the past 2 days. She is unable to stand or ambulate.  Has decreased oral intake. Patient has had multiple falls over the last week or 2.They note that she appears to stop moving her left leg since yesterday. Patient indicates some throat discomfort but unable to describe any other specific symptoms.  Does not seem to have chest pain or abdominal pain.  No active nausea vomiting, diarrhea noted.  No active cough noted.  ED Course: pt was found to have positive Covid 19 Ag test, WBC 7.0, worsening renal function, temperature normal, blood pressure 123/61, heart rate 63, RR 25, oxygen saturation 93-95% on RA, chest x-ray showed bilateral infiltration.  CT of the head is negative for acute intracranial abnormalities.  CT of C-spine is negative for bony fracture.  X-ray of left hip/pelvis is negative for  bony fracture.  Patient is admitted to [place] bed as inpatient.   Subjective   weakness and confusion  Discharge Diagnoses and Hospital Course:   Principal Problem:   Acute respiratory disease due to COVID-19 virus Active Problems:   Alzheimer's dementia with behavioral disturbance ([place])   Essential hypertension   Acute metabolic encephalopathy   Frequent falls   Depression with anxiety   Iron deficiency anemia   Acute renal failure superimposed on stage 3a chronic kidney disease (HCC)  Acute respiratory disease due to COVID-19 virus: O2 sat 93% -->95% on RA.  Chest x-ray bilateral infiltration  -will admit to med-surg bed as inpt -Remdesivir per pharm -Solumedrol 30 mg bid -vitamin C, zinc.  -Bronchodilators -Atrovent inhaler, PRN Xopenex or albuterol inhaler -PRN Mucinex for cough -f/u Blood culture -Gentle IV fluid:  -D-dimer, BNP,Trop, LFT, CRP, LDH, Procalcitonin, Ferritin, fibinogen, TG, Hep B SAg -Daily CRP, Ferritin, D-dimer, -Will ask the patient to maintain an awake prone position for 16+ hours a day, if possible, with a minimum of 2-3 hours at a time -Will attempt to maintain euvolemia to a net negative fluid status -IF patient deteriorates, will consult PCCM and ID -pt will be transferred to [facility] : [facility]  Alzheimer's dementia with behavioral disturbance ([place]): -continue home donepezil  HTN:  -Continue home medications: Amlodipine -Hold Cozaar due to worsening renal function -hydralazine prn  Acute metabolic encephalopathy: Likely due to ongoing infection.  CT head and CT of C-spine negative. -Frequent neuro check  Fall: Imaging negative. -Fall precaution  Depression with anxiety: -Continue home medications  Iron deficiency anemia: Hemoglobin stable, 11.4 -Follow-up of CBC   Acute renal failure superimposed on stage  3a chronic kidney disease ([place]): Baseline creatinine 1.73.  Her creatinine is at 3.09, BUN 62 likely due to prerenal secondary to  dehydration -IV fluid: 1 L normal saline, followed by D5-half-normal saline at 75 cc/h   Discharge Instructions   Allergies as of 08/18/2019      Reactions   Aspirin Other (See Comments)   Unspecified   Lisinopril Other (See Comments)      Medication List    ASK your doctor about these medications   acetaminophen 500 MG tablet Commonly known as: TYLENOL Take 500 mg by mouth every 4 (four) hours as needed for mild pain or fever.   Alumina-Magnesia-Simethicone 200-200-20 MG/5ML suspension Generic drug: alum & mag hydroxide-simeth Take 30 mLs by mouth every 6 (six) hours as needed for indigestion or heartburn.   [employer] VITAMIN B12 1000 MCG tablet Generic drug: cyanocobalamin Take 1,000 mcg by mouth daily.   donepezil 5 MG tablet Commonly known as: ARICEPT Take 5 mg by mouth at bedtime.   ferrous sulfate 325 (65 FE) MG tablet Take 325 mg by mouth daily with breakfast.   guaiFENesin 100 MG/5ML liquid Commonly known as: ROBITUSSIN Take 200 mg by mouth every 6 (six) hours as needed for cough.   loperamide 2 MG capsule Commonly known as: IMODIUM Take 2 mg by mouth as needed for diarrhea or loose stools.   LORazepam 0.5 MG tablet Commonly known as: ATIVAN Take 1 tablet (0.5 mg total) by mouth 2 (two) times daily as needed for anxiety.   losartan 50 MG tablet Commonly known as: COZAAR Take 50 mg by mouth daily.   magnesium hydroxide 400 MG/5ML suspension Commonly known as: MILK OF MAGNESIA Take 30 mLs by mouth at bedtime as needed for mild constipation.   mirtazapine 7.5 MG tablet Commonly known as: REMERON Take 1 tablet (7.5 mg total) by mouth at bedtime. For sleep   neomycin-bacitracin-polymyxin [phone] ointment Apply 1 application topically daily as needed (skin tears).   risperiDONE 0.5 MG tablet Commonly known as: RisperDAL Take 1-1.5 tablets (0.5-0.75 mg total) by mouth as directed. Take 1 tablet in the AM and 1.5 tablets PM       Allergies  Allergen  Reactions  . Aspirin Other (See Comments)    Unspecified  . Lisinopril Other (See Comments)    Consultations:     Procedures/Studies: CT Head Wo Contrast  Result Date: 09/06/2019 CLINICAL DATA:  Posttraumatic headache. EXAM: CT HEAD WITHOUT CONTRAST CT CERVICAL SPINE WITHOUT CONTRAST TECHNIQUE: Multidetector CT imaging of the head and cervical spine was performed following the standard protocol without intravenous contrast. Multiplanar CT image reconstructions of the cervical spine were also generated. COMPARISON:  August 17, 2019. FINDINGS: CT HEAD FINDINGS Brain: Mild chronic ischemic white matter disease is noted. No mass effect or midline shift is noted. Ventricular size is within normal limits. There is no evidence of mass lesion, hemorrhage or acute infarction. Vascular: No hyperdense vessel or unexpected calcification. Skull: Normal. Negative for fracture or focal lesion. Sinuses/Orbits: No acute finding. Other: None. CT CERVICAL SPINE FINDINGS Alignment: Mild grade 1 anterolisthesis of C4-5 is noted secondary to posterior facet joint hypertrophy. Skull base and vertebrae: No acute fracture. No primary bone lesion or focal pathologic process. Soft tissues and spinal canal: No prevertebral fluid or swelling. No visible canal hematoma. Disc levels:  Mild degenerative disc disease is noted at C4-5. Upper chest: Negative. Other: Degenerative changes are seen involving posterior facet joints bilaterally. IMPRESSION: 1. Mild chronic ischemic white matter disease. No  acute intracranial abnormality seen. 2. Multilevel degenerative disc disease. No acute abnormality seen in the cervical spine. Electronically Signed   By: Marijo Conception M.D.   On: 09/06/2019 15:30   CT HEAD WO CONTRAST  Result Date: 08/17/2019 CLINICAL DATA:  Altered mental status for 2 days. EXAM: CT HEAD WITHOUT CONTRAST CT CERVICAL SPINE WITHOUT CONTRAST TECHNIQUE: Multidetector CT imaging of the head and cervical spine was  performed following the standard protocol without intravenous contrast. Multiplanar CT image reconstructions of the cervical spine were also generated. COMPARISON:  Cervical spine MRI 09/19/2008. Head CT scan 04/30/2019. FINDINGS: CT HEAD FINDINGS Brain: No evidence of acute infarction, hemorrhage, hydrocephalus, extra-axial collection or mass lesion/mass effect. Mild chronic microvascular ischemic change again seen. Vascular: Atherosclerosis. Skull: Intact.  No focal lesion. Sinuses/Orbits: Status post cataract surgery on the right. The patient is also status post right maxillary antrostomy. Other: None. Exaggeration of the normal cervical lordosis. 0.3 cm anterolisthesis C4 on C5 due to facet arthropathy. CT CERVICAL SPINE FINDINGS Alignment: There is some exaggeration of the normal cervical lordosis. 0.3 cm anterolisthesis C4 on C5 due to facet arthropathy. Skull base and vertebrae: No acute fracture. No primary bone lesion or focal pathologic process. Soft tissues and spinal canal: No prevertebral fluid or swelling. No visible canal hematoma. Disc levels: There is some loss of disc space height at C4-5. Small disc bulges are seen at C5-6 and C6-7. Upper chest: Lung apices clear. Other: None. IMPRESSION: No acute abnormality head or cervical spine. Chronic microvascular change. Mild cervical spondylosis. Electronically Signed   By: Inge Rise M.D.   On: 08/17/2019 13:27   CT Cervical Spine Wo Contrast  Result Date: 09/06/2019 CLINICAL DATA:  Posttraumatic headache. EXAM: CT HEAD WITHOUT CONTRAST CT CERVICAL SPINE WITHOUT CONTRAST TECHNIQUE: Multidetector CT imaging of the head and cervical spine was performed following the standard protocol without intravenous contrast. Multiplanar CT image reconstructions of the cervical spine were also generated. COMPARISON:  August 17, 2019. FINDINGS: CT HEAD FINDINGS Brain: Mild chronic ischemic white matter disease is noted. No mass effect or midline shift is  noted. Ventricular size is within normal limits. There is no evidence of mass lesion, hemorrhage or acute infarction. Vascular: No hyperdense vessel or unexpected calcification. Skull: Normal. Negative for fracture or focal lesion. Sinuses/Orbits: No acute finding. Other: None. CT CERVICAL SPINE FINDINGS Alignment: Mild grade 1 anterolisthesis of C4-5 is noted secondary to posterior facet joint hypertrophy. Skull base and vertebrae: No acute fracture. No primary bone lesion or focal pathologic process. Soft tissues and spinal canal: No prevertebral fluid or swelling. No visible canal hematoma. Disc levels:  Mild degenerative disc disease is noted at C4-5. Upper chest: Negative. Other: Degenerative changes are seen involving posterior facet joints bilaterally. IMPRESSION: 1. Mild chronic ischemic white matter disease. No acute intracranial abnormality seen. 2. Multilevel degenerative disc disease. No acute abnormality seen in the cervical spine. Electronically Signed   By: Marijo Conception M.D.   On: 09/06/2019 15:30   CT CERVICAL SPINE WO CONTRAST  Result Date: 08/17/2019 CLINICAL DATA:  Altered mental status for 2 days. EXAM: CT HEAD WITHOUT CONTRAST CT CERVICAL SPINE WITHOUT CONTRAST TECHNIQUE: Multidetector CT imaging of the head and cervical spine was performed following the standard protocol without intravenous contrast. Multiplanar CT image reconstructions of the cervical spine were also generated. COMPARISON:  Cervical spine MRI 09/19/2008. Head CT scan 04/30/2019. FINDINGS: CT HEAD FINDINGS Brain: No evidence of acute infarction, hemorrhage, hydrocephalus, extra-axial collection or mass lesion/mass  effect. Mild chronic microvascular ischemic change again seen. Vascular: Atherosclerosis. Skull: Intact.  No focal lesion. Sinuses/Orbits: Status post cataract surgery on the right. The patient is also status post right maxillary antrostomy. Other: None. Exaggeration of the normal cervical lordosis. 0.3 cm  anterolisthesis C4 on C5 due to facet arthropathy. CT CERVICAL SPINE FINDINGS Alignment: There is some exaggeration of the normal cervical lordosis. 0.3 cm anterolisthesis C4 on C5 due to facet arthropathy. Skull base and vertebrae: No acute fracture. No primary bone lesion or focal pathologic process. Soft tissues and spinal canal: No prevertebral fluid or swelling. No visible canal hematoma. Disc levels: There is some loss of disc space height at C4-5. Small disc bulges are seen at C5-6 and C6-7. Upper chest: Lung apices clear. Other: None. IMPRESSION: No acute abnormality head or cervical spine. Chronic microvascular change. Mild cervical spondylosis. Electronically Signed   By: Inge Rise M.D.   On: 08/17/2019 13:27   NM Pulmonary Perfusion  Addendum Date: 09/07/2019   ADDENDUM REPORT: 09/07/2019 00:34 ADDENDUM: I discussed critical Value/emergent results were by telephone at the time of interpretation on 09/07/2019 at 21:50 pm to provider Dr. Laverta Baltimore, who verbally acknowledged these results. Electronically Signed   By: Revonda Humphrey   On: 09/07/2019 00:34   Result Date: 09/07/2019 CLINICAL DATA:  Dyspnea and concern for pulmonary embolism. EXAM: NUCLEAR MEDICINE PERFUSION LUNG SCAN TECHNIQUE: Perfusion images were obtained in multiple projections after intravenous injection of radiopharmaceutical. Ventilation scans intentionally deferred if perfusion scan and chest x-ray adequate for interpretation during COVID-19 epidemic. A technical note states the patient is nonresponsive and lateral scintigraphic images were not acquired. RADIOPHARMACEUTICALS:  1.56 mCi Tc-17m MAA IV COMPARISON:  September 11, 2005. Chest radiography performed earlier on the same date. FINDINGS: There is a large left basilar wedge-shaped perfusion defect that is larger than the area of patchy left medial basilar consolidation on radiography, the perfusion-radiograph mismatch equal to at least 2 pulmonary segments. Ventilation images were  deferred. A stat PRA communication was initiated. IMPRESSION: Nuclear medicine perfusion imaging with high probability of pulmonary embolism according to the modified PIOPED II criteria. Electronically Signed: By: Revonda Humphrey On: 09/06/2019 21:45   US RENAL  Result Date: 09/07/2019 CLINICAL DATA:  Acute renal injury EXAM: RENAL / URINARY TRACT ULTRASOUND COMPLETE COMPARISON:  None. FINDINGS: Right Kidney: Renal measurements: 15.4 x 5.9 x 6.9 cm = volume: 330 mL. Two large anechoic cysts measuring 6.5 and 5.0 cm. Left Kidney: Renal measurements: 8.4 x 4.4 x 4.3 cm = volume: 83 mL. Thickened cortex. No hydronephrosis. No hydronephrosis Bladder: Appears normal for degree of bladder distention. Other: Gallbladder sludge IMPRESSION: 1. No renal obstruction.  No hydronephrosis. 2. Large benign appearing renal cyst of the RIGHT kidney. 3. Asymmetric renal size with the LEFT kidney is smaller than the RIGHT. Electronically Signed   By: Suzy Bouchard M.D.   On: 09/07/2019 14:59   DG Chest Port 1 View  Result Date: 09/06/2019 CLINICAL DATA:  Altered mental status, shortness of breath, COVID positive. EXAM: PORTABLE CHEST 1 VIEW COMPARISON:  Chest radiograph 08/17/2019 FINDINGS: Stable cardiomediastinal contours with mildly enlarged heart size. Low volumes. Mild heterogeneous opacities at the left base could reflect atelectasis versus infiltrates. The right lung is clear. Coarsening of the interstitium bilaterally likely chronic bronchitic change. No pneumothorax or large pleural effusion. No acute finding in the visualized skeleton. IMPRESSION: Mild heterogeneous opacities at the left base could reflect atelectasis versus infiltrates. Electronically Signed   By: Audie Pinto  M.D.   On: 09/06/2019 13:08   DG Chest Portable 1 View  Result Date: 08/17/2019 CLINICAL DATA:  Altered mental status for 2 days. EXAM: PORTABLE CHEST 1 VIEW COMPARISON:  Single-view of the chest 07/08/2009. FINDINGS: Hazy airspace  disease is seen bilaterally, most notable in the upper lung zones and left lung base. No pneumothorax or pleural effusion. Heart size is mildly enlarged. No acute or focal bony abnormality. IMPRESSION: Hazy bilateral airspace disease has an appearance most worrisome for pneumonia. Electronically Signed   By: Inge Rise M.D.   On: 08/17/2019 13:16   DG Swallowing Func-Speech Pathology  Result Date: 09/08/2019 Objective Swallowing Evaluation: Type of Study: MBS-Modified Barium Swallow Study  Patient Details Name: Mahek Schlesinger MRN: [id number] Date of Birth: December 16, 1934 Today's Date: 09/08/2019 Time: SLP Start Time (ACUTE ONLY): 3716 -SLP Stop Time (ACUTE ONLY): 1438 SLP Time Calculation (min) (ACUTE ONLY): 21 min Past Medical History: Past Medical History: Diagnosis Date . Anxiety  . B12 deficiency  . Bipolar disorder ([place])  . Dementia ([place])  . Hypertension  . RBBB  Past Surgical History: No past surgical history on file. HPI: Pt is an 85 yo female admitted with AMS in the setting of AKI, hyponatremia, possible aspiration PNA, and concern for PE. Pt was recently admitted to [facility] for COVID-19 2/17-2/23. PMH also includes: dementia, HTN, CKD stage III  Subjective: pt intermittently responding to questions or commands Assessment / Plan / Recommendation CHL IP CLINICAL IMPRESSIONS 09/08/2019 Clinical Impression Pt with history of dementia seen for MBS after treatment session concerning for aspiration. Moderate-severe oral and moderate pharyngeal dysphagia marked by inadequate laryngeal elevation, pharyngeal contraction and tongue base retraction. Puree bolus held in oral cavity 3 minutes despite lingual pressure with dry spoon/additional volume presentations and ultimately required suctioning for first trial. Strength of swallow was reduced in particular her laryngeal elevation, pharyngeal contraction resulting in vallecular residue and laryngeal penetration. Barium adhered to pharyngeal wall and fell into  vestibule, close to vocal cords. She sensed with slight throat clear and/or second swallow that only inconsistently removed penetrates. If pt consumed po's over the course of a meal the likelihood of aspiration is high. Given her dementia and age, long term alternative nutrition would not be in her best interests. Family may wish to pursue comfort feeds in which case Dys 1 (puree) and honey thick liquids. MD notified of recommendations and to speak with family. Continue NPO status for now.    SLP Visit Diagnosis Dysphagia, oropharyngeal phase (R13.12) Attention and concentration deficit following -- Frontal lobe and executive function deficit following -- Impact on safety and function Moderate aspiration risk;Risk for inadequate nutrition/hydration   CHL IP TREATMENT RECOMMENDATION 09/08/2019 Treatment Recommendations Therapy as outlined in treatment plan below   Prognosis 09/08/2019 Prognosis for Safe Diet Advancement Fair Barriers to Reach Goals Cognitive deficits Barriers/Prognosis Comment -- CHL IP DIET RECOMMENDATION 09/08/2019 SLP Diet Recommendations NPO Liquid Administration via -- Medication Administration -- Compensations -- Postural Changes --   CHL IP OTHER RECOMMENDATIONS 09/08/2019 Recommended Consults -- Oral Care Recommendations Oral care QID Other Recommendations --   CHL IP FOLLOW UP RECOMMENDATIONS 09/08/2019 Follow up Recommendations Skilled Nursing facility   [facility] IP FREQUENCY AND DURATION 09/08/2019 Speech Therapy Frequency (ACUTE ONLY) min 1 x/week Treatment Duration 2 weeks      CHL IP ORAL PHASE 09/08/2019 Oral Phase Impaired Oral - Pudding Teaspoon -- Oral - Pudding Cup -- Oral - Honey Teaspoon -- Oral - Honey Cup Decreased bolus cohesion;Delayed oral  transit;Other (Comment);Left pocketing in lateral sulci;Right pocketing in lateral sulci;Lingual/palatal residue;Holding of bolus;Reduced posterior propulsion Oral - Nectar Teaspoon -- Oral - Nectar Cup -- Oral - Nectar Straw -- Oral - Thin Teaspoon  -- Oral - Thin Cup -- Oral - Thin Straw -- Oral - Puree Decreased bolus cohesion;Delayed oral transit;Other (Comment);Left pocketing in lateral sulci;Right pocketing in lateral sulci;Lingual/palatal residue;Holding of bolus;Reduced posterior propulsion Oral - Mech Soft -- Oral - Regular -- Oral - Multi-Consistency -- Oral - Pill -- Oral Phase - Comment --  CHL IP PHARYNGEAL PHASE 09/08/2019 Pharyngeal Phase Impaired Pharyngeal- Pudding Teaspoon -- Pharyngeal -- Pharyngeal- Pudding Cup -- Pharyngeal -- Pharyngeal- Honey Teaspoon -- Pharyngeal -- Pharyngeal- Honey Cup Reduced epiglottic inversion;Reduced laryngeal elevation;Penetration/Aspiration during swallow;Pharyngeal residue - valleculae;Reduced pharyngeal peristalsis;Pharyngeal residue - pyriform;Pharyngeal residue - posterior pharnyx Pharyngeal Material enters airway, remains ABOVE vocal cords and not ejected out Pharyngeal- Nectar Teaspoon -- Pharyngeal -- Pharyngeal- Nectar Cup -- Pharyngeal -- Pharyngeal- Nectar Straw -- Pharyngeal -- Pharyngeal- Thin Teaspoon -- Pharyngeal -- Pharyngeal- Thin Cup -- Pharyngeal -- Pharyngeal- Thin Straw -- Pharyngeal -- Pharyngeal- Puree Reduced pharyngeal peristalsis;Reduced epiglottic inversion;Reduced laryngeal elevation;Reduced airway/laryngeal closure;Pharyngeal residue - pyriform;Pharyngeal residue - valleculae;Pharyngeal residue - posterior pharnyx;Penetration/Apiration after swallow Pharyngeal Material enters airway, remains ABOVE vocal cords then ejected out Pharyngeal- Mechanical Soft -- Pharyngeal -- Pharyngeal- Regular -- Pharyngeal -- Pharyngeal- Multi-consistency -- Pharyngeal -- Pharyngeal- Pill -- Pharyngeal -- Pharyngeal Comment --  CHL IP CERVICAL ESOPHAGEAL PHASE 09/08/2019 Cervical Esophageal Phase WFL Pudding Teaspoon -- Pudding Cup -- Honey Teaspoon -- Honey Cup -- Nectar Teaspoon -- Nectar Cup -- Nectar Straw -- Thin Teaspoon -- Thin Cup -- Thin Straw -- Puree -- Mechanical Soft -- Regular --  Multi-consistency -- Pill -- Cervical Esophageal Comment -- [place] Siren 09/08/2019, 3:52 PM Orbie Pyo Litaker M.Ed Actor Pager [phone] Office [phone]              ECHOCARDIOGRAM COMPLETE  Result Date: 08/20/2019    ECHOCARDIOGRAM REPORT   Patient Name:   JANILAH HOJNACKI [facility] Date of Exam: 08/20/2019 Medical Rec #:  [id number]            Height:       64.0 in Accession #:    [id number]           Weight:       149.9 lb Date of Birth:  1934/10/06             BSA:          1.73 m Patient Age:    69 years             BP:           157/58 mmHg Patient Gender: F                    HR:           50 bpm. Exam Location:  Inpatient Procedure: 2D Echo Indications:    Bradycardia  History:        Patient has no prior history of Echocardiogram examinations.                 Risk Factors:Hypertension. COVID-19 virus infection                 Chronic kidney disease.  Sonographer:    Vikki Ports Turrentine Referring Phys: [id number] [place] Comments: Suboptimal subcostal window. IMPRESSIONS  1. Left ventricular ejection fraction, by estimation, is  45 to 50%. The left ventricle has mildly decreased function. The left ventricle demonstrates global hypokinesis. Left ventricular diastolic parameters are consistent with Grade I diastolic dysfunction (impaired relaxation).  2. Right ventricular systolic function is normal. The right ventricular size is normal. Tricuspid regurgitation signal is inadequate for assessing PA pressure.  3. Left atrial size was mildly dilated.  4. The mitral valve is grossly normal. Mild mitral valve regurgitation. No evidence of mitral stenosis.  5. The aortic valve is tricuspid. Aortic valve regurgitation is mild. No aortic stenosis is present. Comparison(s): No prior Echocardiogram. FINDINGS  Left Ventricle: Left ventricular ejection fraction, by estimation, is 45 to 50%. The left ventricle has mildly decreased function. The left ventricle demonstrates  global hypokinesis. The left ventricular internal cavity size was normal in size. There is  no left ventricular hypertrophy. Left ventricular diastolic parameters are consistent with Grade I diastolic dysfunction (impaired relaxation). Normal left ventricular filling pressure. Right Ventricle: The right ventricular size is normal. No increase in right ventricular wall thickness. Right ventricular systolic function is normal. Tricuspid regurgitation signal is inadequate for assessing PA pressure. Left Atrium: Left atrial size was mildly dilated. Right Atrium: Right atrial size was normal in size. Pericardium: A small pericardial effusion is present. The pericardial effusion is circumferential. Presence of pericardial fat pad. Mitral Valve: The mitral valve is grossly normal. Mild mitral valve regurgitation. No evidence of mitral valve stenosis. Tricuspid Valve: The tricuspid valve is grossly normal. Tricuspid valve regurgitation is trivial. Aortic Valve: The aortic valve is tricuspid. Aortic valve regurgitation is mild. No aortic stenosis is present. Pulmonic Valve: The pulmonic valve was grossly normal. Pulmonic valve regurgitation is trivial. Aorta: The aortic root is normal in size and structure. Venous: The inferior vena cava was not well visualized. IAS/Shunts: No atrial level shunt detected by color flow Doppler.  LEFT VENTRICLE PLAX 2D LVIDd:         3.92 cm  Diastology LVIDs:         2.82 cm  LV e' lateral:   6.49 cm/s LV PW:         0.94 cm  LV E/e' lateral: 13.4 LV IVS:        0.87 cm  LV e' medial:    7.80 cm/s LVOT diam:     1.90 cm  LV E/e' medial:  11.1 LV SV:         62.66 ml LV SV Index:   20.75 LVOT Area:     2.84 cm  RIGHT VENTRICLE RV S prime:     12.00 cm/s TAPSE (M-mode): 2.7 cm LEFT ATRIUM             Index       RIGHT ATRIUM           Index LA diam:        3.80 cm 2.20 cm/m  RA Area:     17.80 cm LA Vol (A2C):   54.6 ml 31.55 ml/m RA Volume:   48.40 ml  27.96 ml/m LA Vol (A4C):   51.5 ml  29.76 ml/m LA Biplane Vol: 54.4 ml 31.43 ml/m  AORTIC VALVE LVOT Vmax:   73.90 cm/s LVOT Vmean:  53.400 cm/s LVOT VTI:    0.221 m  AORTA Ao Root diam: 2.80 cm MITRAL VALVE MV Area (PHT): 2.87 cm    SHUNTS MV Decel Time: 264 msec    Systemic VTI:  0.22 m MV E velocity: 86.80 cm/s  Systemic Diam: 1.90 cm MV A  velocity: 87.80 cm/s MV E/A ratio:  0.99 Eleonore Chiquito MD Electronically signed by Eleonore Chiquito MD Signature Date/Time: 08/20/2019/10:52:05 AM    Final    DG Hip Unilat W or Wo Pelvis 2-3 Views Left  Result Date: 08/17/2019 CLINICAL DATA:  Altered mental status. The patient refuses to ambulate. No known injury. EXAM: DG HIP (WITH OR WITHOUT PELVIS) 2-3V LEFT COMPARISON:  None. FINDINGS: There is no evidence of hip fracture or dislocation. There is no evidence of arthropathy or other focal bone abnormality. IMPRESSION: Negative exam. Electronically Signed   By: Inge Rise M.D.   On: 08/17/2019 13:15   VAS [place] LOWER EXTREMITY VENOUS (DVT)  Result Date: 09/07/2019  Lower Venous DVTStudy Indications: Swelling, and SOB.  Limitations: Pt position and ability to cooperate. Performing Technologist: Antonieta Pert RDMS, RVT  Examination Guidelines: A complete evaluation includes B-mode imaging, spectral Doppler, color Doppler, and power Doppler as needed of all accessible portions of each vessel. Bilateral testing is considered an integral part of a complete examination. Limited examinations for reoccurring indications may be performed as noted. The reflux portion of the exam is performed with the patient in reverse Trendelenburg.  +---------+---------------+---------+-----------+----------+-------------------+ RIGHT    CompressibilityPhasicitySpontaneityPropertiesThrombus Aging      +---------+---------------+---------+-----------+----------+-------------------+ CFV      Full           Yes      Yes                                       +---------+---------------+---------+-----------+----------+-------------------+ SFJ      Full                                                             +---------+---------------+---------+-----------+----------+-------------------+ FV Prox  Full                                                             +---------+---------------+---------+-----------+----------+-------------------+ FV Mid   Full                                                             +---------+---------------+---------+-----------+----------+-------------------+ FV Distal                                             pt can not tolerate                                                       compression         +---------+---------------+---------+-----------+----------+-------------------+ PFV      Full                                                             +---------+---------------+---------+-----------+----------+-------------------+  POP                     Yes      Yes                  pt can not tolerate                                                       compression         +---------+---------------+---------+-----------+----------+-------------------+ PTV      Full                                                             +---------+---------------+---------+-----------+----------+-------------------+ PERO     Full                                                             +---------+---------------+---------+-----------+----------+-------------------+ GSV      Full                                                             +---------+---------------+---------+-----------+----------+-------------------+   +---------+---------------+---------+-----------+----------+--------------+ LEFT     CompressibilityPhasicitySpontaneityPropertiesThrombus Aging +---------+---------------+---------+-----------+----------+--------------+ CFV      Full            Yes      Yes                                 +---------+---------------+---------+-----------+----------+--------------+ SFJ      Full                                                        +---------+---------------+---------+-----------+----------+--------------+ FV Prox  Full                                                        +---------+---------------+---------+-----------+----------+--------------+ FV Mid   Full                                                        +---------+---------------+---------+-----------+----------+--------------+ FV DistalFull                                                        +---------+---------------+---------+-----------+----------+--------------+  PFV      Full                                                        +---------+---------------+---------+-----------+----------+--------------+ POP      Full           Yes      Yes                                 +---------+---------------+---------+-----------+----------+--------------+ PTV      Full                                                        +---------+---------------+---------+-----------+----------+--------------+ PERO     Full                                                        +---------+---------------+---------+-----------+----------+--------------+ GSV      Full                                                        +---------+---------------+---------+-----------+----------+--------------+     Summary: RIGHT: - There is no evidence of deep vein thrombosis in the lower extremity. However, portions of this examination were limited- see technologist comments above.  - No cystic structure found in the popliteal fossa.  LEFT: - There is no evidence of deep vein thrombosis in the lower extremity.  - No cystic structure found in the popliteal fossa.  *See table(s) above for measurements and observations. Electronically signed by Harold Barban MD on 09/07/2019 at 5:48:31 PM.    Final        Discharge Exam: Vitals:   08/18/19 0000 08/18/19 0017  BP: (!) 146/100 (!) 146/100  Pulse: 61   Resp: (!) 24 (!) 24  Temp: 98.1 F (36.7 C) 98.1 F (36.7 C)  SpO2: 94% 94%   Vitals:   08/17/19 2330 08/17/19 2340 08/18/19 0000 08/18/19 0017  BP: (!) 168/69  (!) 146/100 (!) 146/100  Pulse: (!) 53  61   Resp: (!) 28  (!) 24 (!) 24  Temp: 98 F (36.7 C)  98.1 F (36.7 C) 98.1 F (36.7 C)  TempSrc: Oral  Oral   SpO2: 95% 95% 94% 94%  Weight:        General: Not in acute distress.  Dry mucous membrane HEENT:       Eyes: PERRL, EOMI, no scleral icterus.       ENT: No discharge from the ears and nose, no pharynx injection, no tonsillar enlargement.        Neck: No JVD, no bruit, no mass felt. Heme: No neck lymph node enlargement. Cardiac: S1/S2, RRR, No murmurs, No gallops or rubs. Respiratory: No rales, wheezing, rhonchi or rubs. GI: Soft, nondistended, nontender,  no organomegaly, BS present. GU: No hematuria Ext: No pitting leg edema bilaterally. 2+DP/PT pulse bilaterally. Musculoskeletal: No joint deformities, No joint redness or warmth, no limitation of ROM in spin. Skin: No rashes.  Neuro: confused, not oriented X3, cranial nerves II-XII grossly intact, moves all extremities.   The results of significant diagnostics from this hospitalization (including imaging, microbiology, ancillary and laboratory) are listed below for reference.     Microbiology: Recent Results (from the past 240 hour(s))  Blood Culture (routine x 2)     Status: None   Collection Time: 09/06/19 12:55 PM   Specimen: BLOOD LEFT HAND  Result Value Ref Range Status   Specimen Description BLOOD LEFT HAND  Final   Special Requests   Final    BOTTLES DRAWN AEROBIC ONLY Blood Culture results may not be optimal due to an excessive volume of blood received in culture bottles   Culture   Final    NO GROWTH 5 DAYS Performed at [facility], [place] [address] [place], [place] [address]    Report Status 09/11/2019 FINAL  Final  Blood Culture (routine x 2)     Status: Abnormal   Collection Time: 09/06/19  1:05 PM   Specimen: BLOOD  Result Value Ref Range Status   Specimen Description BLOOD RIGHT ANTECUBITAL  Final   Special Requests   Final    BOTTLES DRAWN AEROBIC AND ANAEROBIC Blood Culture results may not be optimal due to an excessive volume of blood received in culture bottles   Culture  Setup Time   Final    AEROBIC BOTTLE ONLY GRAM POSITIVE COCCI IN CLUSTERS CRITICAL RESULT CALLED TO, READ BACK BY AND VERIFIED WITH: J CARNEY PHARMD 09/07/19 1721 JDW    Culture (A)  Final    STAPHYLOCOCCUS SPECIES (COAGULASE NEGATIVE) THE SIGNIFICANCE OF ISOLATING THIS ORGANISM FROM A SINGLE SET OF BLOOD CULTURES WHEN MULTIPLE SETS ARE DRAWN IS UNCERTAIN. PLEASE NOTIFY THE MICROBIOLOGY DEPARTMENT WITHIN ONE WEEK IF SPECIATION AND SENSITIVITIES ARE REQUIRED. Performed at [facility], [place] [address] [place], [place] [address]    Report Status 09/08/2019 FINAL  Final  Urine culture     Status: Abnormal   Collection Time: 09/06/19 10:04 PM   Specimen: In/Out Cath Urine  Result Value Ref Range Status   Specimen Description IN/OUT CATH URINE  Final   Special Requests   Final    NONE Performed at [facility], [place] [address], [place], [place] [address]    Culture 1,000 COLONIES/mL ENTEROCOCCUS FAECALIS (A)  Final   Report Status 09/09/2019 FINAL  Final   Organism ID, Bacteria ENTEROCOCCUS FAECALIS (A)  Final      Susceptibility   Enterococcus faecalis - MIC*    AMPICILLIN <=2 SENSITIVE Sensitive     NITROFURANTOIN <=16 SENSITIVE Sensitive     VANCOMYCIN 1 SENSITIVE Sensitive     * 1,000 COLONIES/mL ENTEROCOCCUS FAECALIS  MRSA PCR Screening     Status: None   Collection Time: 09/08/19 11:39 AM   Specimen: Nasal Mucosa; Nasopharyngeal  Result Value Ref Range Status   MRSA by PCR NEGATIVE NEGATIVE Final    Comment:         The GeneXpert MRSA Assay (FDA approved for NASAL specimens only), is one component of a comprehensive MRSA colonization surveillance program. It is not intended to diagnose MRSA infection nor to guide or monitor treatment for MRSA infections. Performed at [facility], [place] [address], [place], [place] [address]  Culture, blood (routine x 2)     Status: None (Preliminary result)   Collection Time: 09/09/19  7:40 AM   Specimen: BLOOD RIGHT WRIST  Result Value Ref Range Status   Specimen Description BLOOD RIGHT WRIST  Final   Special Requests   Final    BOTTLES DRAWN AEROBIC AND ANAEROBIC Blood Culture adequate volume   Culture NO GROWTH 4 DAYS  Final   Report Status PENDING  Incomplete  Culture, blood (routine x 2)     Status: None (Preliminary result)   Collection Time: 09/09/19  7:51 AM   Specimen: BLOOD  Result Value Ref Range Status   Specimen Description BLOOD LEFT ANTECUBITAL  Final   Special Requests   Final    BOTTLES DRAWN AEROBIC AND ANAEROBIC Blood Culture adequate volume   Culture NO GROWTH 4 DAYS  Final   Report Status PENDING  Incomplete     Labs: BNP (last 3 results) Recent Labs    08/17/19 1909 09/13/19 0333  BNP 39.0 128.7*   Basic Metabolic Panel: Recent Labs  Lab 09/08/19 2356 09/09/19 0751 09/10/19 0237 09/11/19 0336 09/12/19 0132 09/13/19 0333  NA 150* 150* 148* 146* 146*  --   K 4.1 3.9 3.8 3.7 3.7  --   CL 128* 127* 125* 121* 124*  --   CO2 13* 12* 13* 16* 16*  --   GLUCOSE 78 73 101* 107* 123*  --   BUN 60* 56* 47* 33* 27*  --   CREATININE 1.97* 1.80* 1.59* 1.33* 1.18*  --   CALCIUM 7.8* 7.7* 8.3* 8.1* 8.3*  --   MG  --   --   --   --   --  1.7   Liver Function Tests: Recent Labs  Lab 09/07/19 0236 09/08/19 0817 09/08/19 2356  AST 35 20 20  ALT 40 27 26  ALKPHOS 84 76 74  BILITOT 1.4* 1.0 1.0  PROT 6.3* 6.0* 5.3*  ALBUMIN 1.8* 1.5* 1.3*   No results for input(s): LIPASE, AMYLASE in the last 168 hours. No results  for input(s): AMMONIA in the last 168 hours. CBC: Recent Labs  Lab 09/08/19 2356 09/10/19 0237 09/11/19 0336 09/12/19 0132 09/13/19 0333  WBC 7.0 8.2 9.5 9.7 9.4  NEUTROABS  --   --   --   --  4.6  HGB 7.3* 7.7* 7.5* 7.7* 7.7*  HCT 24.8* 25.1* 24.6* 24.9* 24.8*  MCV 103.8* 100.4* 98.8 98.4 98.4  PLT 232 253 295 345 358   Cardiac Enzymes: No results for input(s): CKTOTAL, CKMB, CKMBINDEX, TROPONINI in the last 168 hours. BNP: Invalid input(s): POCBNP CBG: No results for input(s): GLUCAP in the last 168 hours. D-Dimer No results for input(s): DDIMER in the last 72 hours. Hgb A1c No results for input(s): HGBA1C in the last 72 hours. Lipid Profile No results for input(s): CHOL, HDL, LDLCALC, TRIG, CHOLHDL, LDLDIRECT in the last 72 hours. Thyroid function studies No results for input(s): TSH, T4TOTAL, T3FREE, THYROIDAB in the last 72 hours.  Invalid input(s): FREET3 Anemia work up Recent Labs    09/12/19 1141 09/12/19 1147  VITAMINB12 2,173*  --   FOLATE 7.5  --   FERRITIN 288  --   TIBC 130*  --   IRON 27*  --   RETICCTPCT  --  1.3   Urinalysis    Component Value Date/Time   COLORURINE AMBER (A) 09/06/2019 2204   APPEARANCEUR CLOUDY (A) 09/06/2019 2204   LABSPEC 1.019 09/06/2019 2204   PHURINE  5.0 09/06/2019 2204   GLUCOSEU NEGATIVE 09/06/2019 2204   HGBUR SMALL (A) 09/06/2019 2204   [place] 09/06/2019 [place] 09/06/2019 2204   PROTEINUR 30 (A) 09/06/2019 2204   NITRITE NEGATIVE 09/06/2019 2204   LEUKOCYTESUR SMALL (A) 09/06/2019 2204   Sepsis Labs Invalid input(s): PROCALCITONIN,  WBC,  LACTICIDVEN Microbiology Recent Results (from the past 240 hour(s))  Blood Culture (routine x 2)     Status: None   Collection Time: 09/06/19 12:55 PM   Specimen: BLOOD LEFT HAND  Result Value Ref Range Status   Specimen Description BLOOD LEFT HAND  Final   Special Requests   Final    BOTTLES DRAWN AEROBIC ONLY Blood Culture results may  not be optimal due to an excessive volume of blood received in culture bottles   Culture   Final    NO GROWTH 5 DAYS Performed at [facility], [place] [address], [place], [place] [address]    Report Status 09/11/2019 FINAL  Final  Blood Culture (routine x 2)     Status: Abnormal   Collection Time: 09/06/19  1:05 PM   Specimen: BLOOD  Result Value Ref Range Status   Specimen Description BLOOD RIGHT ANTECUBITAL  Final   Special Requests   Final    BOTTLES DRAWN AEROBIC AND ANAEROBIC Blood Culture results may not be optimal due to an excessive volume of blood received in culture bottles   Culture  Setup Time   Final    AEROBIC BOTTLE ONLY GRAM POSITIVE COCCI IN CLUSTERS CRITICAL RESULT CALLED TO, READ BACK BY AND VERIFIED WITH: J CARNEY PHARMD 09/07/19 1721 JDW    Culture (A)  Final    STAPHYLOCOCCUS SPECIES (COAGULASE NEGATIVE) THE SIGNIFICANCE OF ISOLATING THIS ORGANISM FROM A SINGLE SET OF BLOOD CULTURES WHEN MULTIPLE SETS ARE DRAWN IS UNCERTAIN. PLEASE NOTIFY THE MICROBIOLOGY DEPARTMENT WITHIN ONE WEEK IF SPECIATION AND SENSITIVITIES ARE REQUIRED. Performed at [facility], [place] [address] [place], [place] [address]    Report Status 09/08/2019 FINAL  Final  Urine culture     Status: Abnormal   Collection Time: 09/06/19 10:04 PM   Specimen: In/Out Cath Urine  Result Value Ref Range Status   Specimen Description IN/OUT CATH URINE  Final   Special Requests   Final    NONE Performed at [facility], [place] [address], [place], [place] [address]    Culture 1,000 COLONIES/mL ENTEROCOCCUS FAECALIS (A)  Final   Report Status 09/09/2019 FINAL  Final   Organism ID, Bacteria ENTEROCOCCUS FAECALIS (A)  Final      Susceptibility   Enterococcus faecalis - MIC*    AMPICILLIN <=2 SENSITIVE Sensitive     NITROFURANTOIN <=16 SENSITIVE Sensitive     VANCOMYCIN 1 SENSITIVE Sensitive     * 1,000 COLONIES/mL ENTEROCOCCUS FAECALIS  MRSA PCR Screening     Status: None   Collection Time:  09/08/19 11:39 AM   Specimen: Nasal Mucosa; Nasopharyngeal  Result Value Ref Range Status   MRSA by PCR NEGATIVE NEGATIVE Final    Comment:        The GeneXpert MRSA Assay (FDA approved for NASAL specimens only), is one component of a comprehensive MRSA colonization surveillance program. It is not intended to diagnose MRSA infection nor to guide or monitor treatment for MRSA infections. Performed at [facility], [place] [address], [place], [place] [address]   Culture, blood (routine x 2)     Status: None (Preliminary  result)   Collection Time: 09/09/19  7:40 AM   Specimen: BLOOD RIGHT WRIST  Result Value Ref Range Status   Specimen Description BLOOD RIGHT WRIST  Final   Special Requests   Final    BOTTLES DRAWN AEROBIC AND ANAEROBIC Blood Culture adequate volume   Culture NO GROWTH 4 DAYS  Final   Report Status PENDING  Incomplete  Culture, blood (routine x 2)     Status: None (Preliminary result)   Collection Time: 09/09/19  7:51 AM   Specimen: BLOOD  Result Value Ref Range Status   Specimen Description BLOOD LEFT ANTECUBITAL  Final   Special Requests   Final    BOTTLES DRAWN AEROBIC AND ANAEROBIC Blood Culture adequate volume   Culture NO GROWTH 4 DAYS  Final   Report Status PENDING  Incomplete    Time coordinating discharge:  35 minutes.  SIGNED:  Ivor Costa, MD [facility] 09/13/2019, 6:23 PM   If 7PM-7AM, please contact night-coverage [email or web address]

## 2019-09-13 NOTE — Plan of Care (Signed)
  Problem: Education: Goal: Knowledge of General Education information will improve Description: Including pain rating scale, medication(s)/side effects and non-pharmacologic comfort measures 09/13/2019 1200 by Gwinda Passe, LPN Outcome: Adequate for Discharge 09/13/2019 1200 by Gwinda Passe, LPN Outcome: Adequate for Discharge   Problem: Health Behavior/Discharge Planning: Goal: Ability to manage health-related needs will improve 09/13/2019 1200 by Gwinda Passe, LPN Outcome: Adequate for Discharge 09/13/2019 1200 by Gwinda Passe, LPN Outcome: Adequate for Discharge   Problem: Clinical Measurements: Goal: Ability to maintain clinical measurements within normal limits will improve 09/13/2019 1200 by Gwinda Passe, LPN Outcome: Adequate for Discharge 09/13/2019 1200 by Gwinda Passe, LPN Outcome: Adequate for Discharge Goal: Will remain free from infection 09/13/2019 1200 by Gwinda Passe, LPN Outcome: Adequate for Discharge 09/13/2019 1200 by Gwinda Passe, LPN Outcome: Adequate for Discharge Goal: Diagnostic test results will improve 09/13/2019 1200 by Gwinda Passe, LPN Outcome: Adequate for Discharge 09/13/2019 1200 by Gwinda Passe, LPN Outcome: Adequate for Discharge Goal: Respiratory complications will improve 09/13/2019 1200 by Gwinda Passe, LPN Outcome: Adequate for Discharge 09/13/2019 1200 by Gwinda Passe, LPN Outcome: Adequate for Discharge Goal: Cardiovascular complication will be avoided 09/13/2019 1200 by Gwinda Passe, LPN Outcome: Adequate for Discharge 09/13/2019 1200 by Gwinda Passe, LPN Outcome: Adequate for Discharge   Problem: Activity: Goal: Risk for activity intolerance will decrease 09/13/2019 1200 by Gwinda Passe, LPN Outcome: Adequate for Discharge 09/13/2019 1200 by Gwinda Passe, LPN Outcome: Adequate for Discharge   Problem: Nutrition: Goal: Adequate nutrition will be maintained 09/13/2019 1200 by Gwinda Passe,  LPN Outcome: Adequate for Discharge 09/13/2019 1200 by Gwinda Passe, LPN Outcome: Adequate for Discharge   Problem: Coping: Goal: Level of anxiety will decrease 09/13/2019 1200 by Gwinda Passe, LPN Outcome: Adequate for Discharge 09/13/2019 1200 by Gwinda Passe, LPN Outcome: Adequate for Discharge   Problem: Elimination: Goal: Will not experience complications related to bowel motility 09/13/2019 1200 by Gwinda Passe, LPN Outcome: Adequate for Discharge 09/13/2019 1200 by Gwinda Passe, LPN Outcome: Adequate for Discharge Goal: Will not experience complications related to urinary retention 09/13/2019 1200 by Gwinda Passe, LPN Outcome: Adequate for Discharge 09/13/2019 1200 by Gwinda Passe, LPN Outcome: Adequate for Discharge   Problem: Safety: Goal: Ability to remain free from injury will improve 09/13/2019 1200 by Gwinda Passe, LPN Outcome: Adequate for Discharge 09/13/2019 1200 by Gwinda Passe, LPN Outcome: Adequate for Discharge   Problem: Skin Integrity: Goal: Risk for impaired skin integrity will decrease 09/13/2019 1200 by Gwinda Passe, LPN Outcome: Adequate for Discharge 09/13/2019 1200 by Gwinda Passe, LPN Outcome: Adequate for Discharge

## 2019-09-13 NOTE — NC FL2 (Signed)
[place] LEVEL OF CARE SCREENING TOOL     IDENTIFICATION  Patient Name: Leah Waller Birthdate: 04/10/1935 Sex: female Admission Date (Current Location): 09/06/2019  [facility] and [place] Number:  Herbalist and Address:  [facility] [employer], [place] [address] [place], [place] [address]      Provider Number: [id number]  Attending Physician Name and Address:  Thurnell Lose, MD  Relative Name and Phone Number:  Daughter Marinus Maw [phone]    Current Level of Care: Other (Comment) Recommended Level of Care: [place] Prior Approval Number:    Date Approved/Denied:   PASRR Number: [id number] F expires 09/22/19(Expires 09/22/19)  Discharge Plan: SNF    Current Diagnoses: Patient Active Problem List   Diagnosis Date Noted  . Sepsis ([place]) 09/06/2019  . Hyperchloremia 09/06/2019  . Hyperkalemia 09/06/2019  . Acute hypoxemic respiratory failure ([place]) 09/06/2019  . Dementia without behavioral disturbance ([place]) 08/18/2019  . Bipolar 1 disorder ([place]) 08/18/2019  . COVID-19 virus infection 08/17/2019  . Acute metabolic encephalopathy 84/69/6295  . Frequent falls 08/17/2019  . Depression with anxiety 08/17/2019  . Iron deficiency anemia 08/17/2019  . Acute renal failure superimposed on stage 3a chronic kidney disease ([place]) 08/17/2019  . Acute respiratory disease due to COVID-19 virus 08/17/2019  . Pneumonia due to COVID-19 virus 08/17/2019  . Dehydration with hypernatremia 08/17/2019  . Acute cystitis 08/17/2019  . DNR (do not resuscitate) 08/17/2019  . Memory deficit 04/14/2018  . Essential hypertension 04/14/2018  . Hyperlipidemia 04/14/2018  . Anxiety 04/14/2018  . Alzheimer's dementia with behavioral disturbance ([place]) 12/22/2017  . Low vitamin B12 level 10/11/2014    Orientation RESPIRATION BLADDER Height & Weight     Self  Normal Incontinent Weight: 69.5 kg Height:  5\' 3"  (160 cm)  BEHAVIORAL  SYMPTOMS/MOOD NEUROLOGICAL BOWEL NUTRITION STATUS      Incontinent Diet(Soft diet with feeding assistance and aspiration precautions.  Honey thick liquids. See discharge summary)  AMBULATORY STATUS COMMUNICATION OF NEEDS Skin   Extensive Assist Verbally PU Stage and Appropriate Care(Left Heel Deep Tissue Pressure Injury)                       Personal Care Assistance Level of Assistance  Bathing, Feeding, Dressing Bathing Assistance: Maximum assistance Feeding assistance: Maximum assistance Dressing Assistance: Maximum assistance     Functional Limitations Info  Sight Sight Info: Impaired        SPECIAL CARE FACTORS FREQUENCY  PT (By licensed PT), OT (By licensed OT)     PT Frequency: PT x 5 OT Frequency: OT x 5            Contractures Contractures Info: Not present(none documented)    Additional Factors Info  Code Status, Psychotropic, Allergies, Insulin Sliding Scale Code Status Info: DNR Allergies Info: Aspirin, Lisinopril Psychotropic Info: Aricept 5mg  daily, Remeron 7.5mg  daily at bed         Current Medications (09/13/2019):  This is the current hospital active medication list Current Facility-Administered Medications  Medication Dose Route Frequency Provider Last Rate Last Admin  . acetaminophen (TYLENOL) tablet 650 mg  650 mg Oral Q6H PRN Pahwani, Rinka R, MD      . apixaban (ELIQUIS) tablet 10 mg  10 mg Oral BID Jonetta Osgood, MD   10 mg at 09/12/19 2230   Followed by  . apixaban (ELIQUIS) tablet 5 mg  5 mg Oral BID Jonetta Osgood, MD      .  ferrous sulfate tablet 325 mg  325 mg Oral TID WC Thurnell Lose, MD   325 mg at 09/12/19 1433  . food thickener (THICK IT) powder   Oral PRN Ghimire, Henreitta Leber, MD      . furosemide (LASIX) tablet 40 mg  40 mg Oral Once Thurnell Lose, MD      . hydrALAZINE (APRESOLINE) injection 10 mg  10 mg Intravenous Q6H PRN Pahwani, Rinka R, MD      . ipratropium-albuterol (DUONEB) 0.5-2.5 (3) MG/3ML nebulizer  solution 3 mL  3 mL Inhalation Q6H PRN Ghimire, Henreitta Leber, MD      . ondansetron (ZOFRAN) injection 4 mg  4 mg Intravenous Q6H PRN Pahwani, Rinka R, MD      . pantoprazole (PROTONIX) EC tablet 40 mg  40 mg Oral Daily Thurnell Lose, MD   40 mg at 09/12/19 1141  . potassium chloride (KLOR-CON) packet 20 mEq  20 mEq Oral Once Thurnell Lose, MD         Discharge Medications: Please see discharge summary for a list of discharge medications.  Relevant Imaging Results:  Relevant Lab Results:   Additional Information Patients Social Security Number : [id number]  Maryclare Labrador, RN

## 2019-09-13 NOTE — TOC Transition Note (Addendum)
Transition of Care [facility]) - CM/SW Discharge Note   Patient Details  Name: Leah Waller MRN: [id number] Date of Birth: [birth year]  [facility] CM/SW Contact:  Maryclare Labrador, RN Phone Number: 09/13/2019, 8:36 AM   Clinical Narrative:    Pt deemed stable to discharge back to [facility] today - CM confirmed with Olivia Mackie at the facility that pt can be accepted back today and the facility will start auth process with insurance company.  Per Olivia Mackie pt can be discharged once process is initiated - pt doesn't have to wait at [facility] until Josem Kaufmann is received.  Also, facility informed CM that pt does not require a new COVID test (pt tested positive on last admit - pt discharged to [facility] from that admit).  Facility to follow up with CM with report number and room number.  FL2 updated with diet - attending requested to sign- facility aware that pt is now on a dysphagia diet.   CM faxed discharge summary to facility - facility confirms receipt and acceptance of discharge summary.  CM faxed SNF transfer packet to facility - facility confirms receipt.  Miquel Dunn information given to bedside nurse 1.  Pt will go to [facility] once she arrives at facility, no room number given.   2.  Call report to [phone] 3.  Please call [facility] once pt is ready:  [phone]  4.  Discharge pkg printed to nurse station - please ensure completed DNR is placed with transport pkg 5.  Please inform daughter once pt's leaves [facility]  CM signing off    Final next level of care: [place] Barriers to Discharge: Continued Medical Work up   Patient Goals and CMS Choice Patient states their goals for this hospitalization and ongoing recovery are:: patient unable to participate in goal setting due to disorientation   Choice offered to / list presented to : Adult Children([name])  Discharge Placement              Patient chooses bed at: [facility](Daughter chose bed - pt will return  to [facility]) Patient to be transferred to facility by: [place] Name of family member notified: Daughter:  [name] Patient and family notified of of transfer: 09/13/19  Discharge Plan and Services                                     Social Determinants of Health (SDOH) Interventions     Readmission Risk Interventions No flowsheet data found.

## 2019-09-13 NOTE — Discharge Summary (Signed)
Leah Waller LKT:[id number] DOB: 07-17-1934 DOA: 09/06/2019  PCP: [facility]  Admit date: 09/06/2019  Discharge date: 09/13/2019  Admitted From: SNF   Disposition:  SNF   Recommendations for Outpatient Follow-up:   Follow up with PCP in 1-2 weeks  PCP Please obtain BMP/CBC, 2 view CXR in 1week,  (see Discharge instructions)   PCP Please follow up on the following pending results: Check BMP in 2 weeks.   Home Health: None   Equipment/Devices: None  Consultations: None  Discharge Condition: Fair   CODE STATUS: DNR   Diet Recommendation: Soft diet with feeding assistance and aspiration precautions.  Honey thick liquids.    Chief Complaint  Patient presents with  . Altered Mental Status  . Shortness of Breath     Brief history of present illness from the day of admission and additional interim summary    Patient is a 85 y.o. female with past medical history of dementia, HTN, CKD stage III-recent history of COVID-19 infection requiring hospitalization-presenting to the hospital with acute metabolic encephalopathy in the setting of AKI, hyponatremia and possible aspiration pneumonia.  Upon further imaging in the emergency room-likely thought to have pulmonary embolism as well.  See below for further details.  Significant events: 2/17-2/23>> admit to [facility] for [id number] pneumonia-complicated by acute metabolic encephalopathy and bradycardia. 3/8>> admit to [facility] for encephalopathy due to AKI, hypernatremia, aspiration pneumonia and possible PE. 3/8>> VQ scan- high probability for PE-started on IV heparin  Antimicrobial therapy: Unasyn 3/12>>stop date 3/15 Cefepime: 3/8>>3/12 Vancomycin: 3/8>> 3/10.  Microbiology data: 3/11>> blood culture: No growth 3/8>> blood culture: 1/2+ for coag  negative staph (likely contaminant) 3/8>> urine culture: 1000 colonies of Enterococcus (suspect contaminant)                                                                 Hospital Course     Acute metabolic encephalopathy: Secondary to hypernatremia, AKI and probable aspiration pneumonia.  Overall more improved-remains relatively awake and alert-has baseline dementia and is otherwise pleasantly confused.  Continue supportive care.  CT head without acute findings on admission.  AKI a stage IIIa: Likely hemodynamically mediated-in the setting of poor oral intake-sepsis-and use of losartan.UA without significant proteinuria.  AKI improving-and now close to baseline with supportive care and gentle hydration.  Renal ultrasound without hydronephrosis.    Note she is on ARB at SNF, monitor renal function intermittently.  Hypernatremia:  has been been gently hydrated with D5W, repeat BMP in 7 to 10 days at [facility] along with CBC.   Acute hypoxemic respiratory failure: Suspect secondary to pulmonary embolism and likely aspiration pneumonia.  Hypoxia has resolved-stable on room air-with IV antibiotics and anticoagulation, continue Eliquis.  Pulmonary embolism: Likely provoked by recent Covid infection-sedentary lifestyle.  VQ scan on 3/8-with high  probability.  Lower extremity Dopplers negative for DVT.  Initially on IV heparin-has been transitioned to Eliquis (note on 3/12-risk/benefits of anticoagulation discussed with daughter over the phone by previous MD)  Sepsis likely secondary to aspiration pneumonia: Sepsis physiology has resolved-cultures as above-transitioned to Unasyn  Dysphagia: Was pooling secretions on admission-much better.  SLP evaluation appreciated-previous MD spoke with daughter on 3/11-explained the risks of dysphagia 1 diet with honey thick liquids-family accepted the risk of aspiration-started on dysphagia 1 diet with honey thick liquids.  Anemia: Likely anemia of chronic  disease, with some evidence of iron deficiency on anemia panel, on oral iron supplementation, PPI added, if desired PCP can direct age-appropriate outpatient iron deficiency anemia follow-up having said that she is not a candidate for EGD or colonoscopy, check CBC intermittently at SNF.  Recent COVID-19 pneumonia with resultant debility/deconditioning and failure to thrive syndrome: Supportive care-PT/OT eval .  Does not have active Covid pneumonitis at this time.  Chronic combined systolic and diastolic heart failure (EF 45-50% by TTE on 08/20/2019): Compensated.  HTN: BP currently we will continue home medications.  Dementia:  Continue home dose risperidone & Remeron.  Large right renal cyst: Incidental finding on renal ultrasound-stable for outpatient PCP directed age-appropriate follow-up.  Goals of care: Given patient's overall frailty-dementia-multiple chronic comorbidities as outlined above-now acutely ill with AKI, hypernatremia, aspiration pneumonia and PE-she remains at risk for decompensation and further deterioration.  Patient's daughter is aware of the tenuous overall clinical situation-I have recommended that given overall poor prognosis-we make her a full DNR (golden yellow rod/MOST form at present bedside).  Plan is to continue treatment of the acute issues to see if she recovers-if she deteriorates-daughter is aware that we may need to transition to hospice care.  She is not a candidate for aggressive treatment or invasive procedures.  Continue gentle medical treatment, if any significant decline in the future focus on comfort and plan for hospice.  Discharge diagnosis     Principal Problem:   Sepsis ([place]) Active Problems:   Essential hypertension   Hyperlipidemia   Anxiety   COVID-19 virus infection   Acute metabolic encephalopathy   Depression with anxiety   Acute renal failure superimposed on stage 3a chronic kidney disease (HCC)   Dehydration with  hypernatremia   Hyperchloremia   Hyperkalemia   Acute hypoxemic respiratory failure (HCC)    Discharge instructions    Discharge Instructions    Diet - low sodium heart healthy   Complete by: As directed    Discharge instructions   Complete by: As directed    Follow with Primary MD [facility] in 7 days   Get CBC, CMP, 2 view Chest X ray -  checked next visit within 1 week by Primary MD or SNF MD    Activity: As tolerated with Full fall precautions use walker/cane & assistance as needed  Disposition SNF  Diet: Soft diet - honey thick liquids with feeding assistance and aspiration precautions.   Special Instructions: If you have smoked or chewed Tobacco  in the last 2 yrs please stop smoking, stop any regular Alcohol  and or any Recreational drug use.  On your next visit with your primary care physician please Get Medicines reviewed and adjusted.  Please request your Prim.MD to go over all Hospital Tests and Procedure/Radiological results at the follow up, please get all Hospital records sent to your Prim MD by signing hospital release before you go home.  If you experience worsening of your  admission symptoms, develop shortness of breath, life threatening emergency, suicidal or homicidal thoughts you must seek medical attention immediately by calling 911 or calling your MD immediately  if symptoms less severe.  You Must read complete instructions/literature along with all the possible adverse reactions/side effects for all the Medicines you take and that have been prescribed to you. Take any new Medicines after you have completely understood and accpet all the possible adverse reactions/side effects.      Discharge Medications   Allergies as of 09/13/2019      Reactions   Aspirin Other (See Comments)   Unspecified   Lisinopril Other (See Comments)      Medication List    STOP taking these medications   dexamethasone 6 MG tablet Commonly known as:  DECADRON     TAKE these medications   acetaminophen 500 MG tablet Commonly known as: TYLENOL Take 500 mg by mouth every 4 (four) hours as needed for mild pain or fever.   Alumina-Magnesia-Simethicone 200-200-20 MG/5ML suspension Generic drug: alum & mag hydroxide-simeth Take 30 mLs by mouth every 6 (six) hours as needed for indigestion or heartburn.   amLODipine 10 MG tablet Commonly known as: NORVASC Take 1 tablet (10 mg total) by mouth daily.   apixaban 5 MG Tabs tablet Commonly known as: ELIQUIS Take 1 tablet (5 mg total) by mouth 2 (two) times daily.   ascorbic acid 500 MG tablet Commonly known as: VITAMIN C Take 1 tablet (500 mg total) by mouth daily.   [employer] VITAMIN B12 1000 MCG tablet Generic drug: cyanocobalamin Take 1,000 mcg by mouth daily.   donepezil 5 MG tablet Commonly known as: ARICEPT Take 5 mg by mouth at bedtime.   ferrous sulfate 325 (65 FE) MG tablet Take 325 mg by mouth daily with breakfast.   guaiFENesin 100 MG/5ML liquid Commonly known as: ROBITUSSIN Take 200 mg by mouth every 6 (six) hours as needed for cough.   Ipratropium-Albuterol 20-100 MCG/ACT Aers respimat Commonly known as: COMBIVENT Inhale 1 puff into the lungs 4 (four) times daily.   loperamide 2 MG capsule Commonly known as: IMODIUM Take 2 mg by mouth as needed for diarrhea or loose stools.   LORazepam 0.5 MG tablet Commonly known as: ATIVAN Take 1 tablet (0.5 mg total) by mouth 2 (two) times daily as needed for anxiety.   losartan 50 MG tablet Commonly known as: COZAAR Take 50 mg by mouth daily.   magnesium hydroxide 400 MG/5ML suspension Commonly known as: MILK OF MAGNESIA Take 30 mLs by mouth at bedtime as needed for mild constipation.   mirtazapine 7.5 MG tablet Commonly known as: REMERON Take 1 tablet (7.5 mg total) by mouth at bedtime. For sleep   neomycin-bacitracin-polymyxin [phone] ointment Apply 1 application topically daily as needed (skin tears).    ondansetron 4 MG tablet Commonly known as: ZOFRAN Take 1 tablet (4 mg total) by mouth every 6 (six) hours as needed for nausea.   pantoprazole 40 MG tablet Commonly known as: PROTONIX Take 1 tablet (40 mg total) by mouth daily.   risperiDONE 0.5 MG tablet Commonly known as: RisperDAL Take 1-1.5 tablets (0.5-0.75 mg total) by mouth as directed. Take 1 tablet in the AM and 1.5 tablets PM What changed:   how much to take  additional instructions   zinc sulfate 220 (50 Zn) MG capsule Take 1 capsule (220 mg total) by mouth daily.       Follow-up Information    [facility] Schedule an appointment  as soon as possible for a visit in 1 week(s).   Specialty: Geriatric Medicine Contact information: [address] [place] [id number] [phone]           Major procedures and Radiology Reports - PLEASE review detailed and final reports thoroughly  -        CT Head Wo Contrast  Result Date: 09/06/2019 CLINICAL DATA:  Posttraumatic headache. EXAM: CT HEAD WITHOUT CONTRAST CT CERVICAL SPINE WITHOUT CONTRAST TECHNIQUE: Multidetector CT imaging of the head and cervical spine was performed following the standard protocol without intravenous contrast. Multiplanar CT image reconstructions of the cervical spine were also generated. COMPARISON:  August 17, 2019. FINDINGS: CT HEAD FINDINGS Brain: Mild chronic ischemic white matter disease is noted. No mass effect or midline shift is noted. Ventricular size is within normal limits. There is no evidence of mass lesion, hemorrhage or acute infarction. Vascular: No hyperdense vessel or unexpected calcification. Skull: Normal. Negative for fracture or focal lesion. Sinuses/Orbits: No acute finding. Other: None. CT CERVICAL SPINE FINDINGS Alignment: Mild grade 1 anterolisthesis of C4-5 is noted secondary to posterior facet joint hypertrophy. Skull base and vertebrae: No acute fracture. No primary bone lesion or focal  pathologic process. Soft tissues and spinal canal: No prevertebral fluid or swelling. No visible canal hematoma. Disc levels:  Mild degenerative disc disease is noted at C4-5. Upper chest: Negative. Other: Degenerative changes are seen involving posterior facet joints bilaterally. IMPRESSION: 1. Mild chronic ischemic white matter disease. No acute intracranial abnormality seen. 2. Multilevel degenerative disc disease. No acute abnormality seen in the cervical spine. Electronically Signed   By: Marijo Conception M.D.   On: 09/06/2019 15:30   CT HEAD WO CONTRAST  Result Date: 08/17/2019 CLINICAL DATA:  Altered mental status for 2 days. EXAM: CT HEAD WITHOUT CONTRAST CT CERVICAL SPINE WITHOUT CONTRAST TECHNIQUE: Multidetector CT imaging of the head and cervical spine was performed following the standard protocol without intravenous contrast. Multiplanar CT image reconstructions of the cervical spine were also generated. COMPARISON:  Cervical spine MRI 09/19/2008. Head CT scan 04/30/2019. FINDINGS: CT HEAD FINDINGS Brain: No evidence of acute infarction, hemorrhage, hydrocephalus, extra-axial collection or mass lesion/mass effect. Mild chronic microvascular ischemic change again seen. Vascular: Atherosclerosis. Skull: Intact.  No focal lesion. Sinuses/Orbits: Status post cataract surgery on the right. The patient is also status post right maxillary antrostomy. Other: None. Exaggeration of the normal cervical lordosis. 0.3 cm anterolisthesis C4 on C5 due to facet arthropathy. CT CERVICAL SPINE FINDINGS Alignment: There is some exaggeration of the normal cervical lordosis. 0.3 cm anterolisthesis C4 on C5 due to facet arthropathy. Skull base and vertebrae: No acute fracture. No primary bone lesion or focal pathologic process. Soft tissues and spinal canal: No prevertebral fluid or swelling. No visible canal hematoma. Disc levels: There is some loss of disc space height at C4-5. Small disc bulges are seen at C5-6 and  C6-7. Upper chest: Lung apices clear. Other: None. IMPRESSION: No acute abnormality head or cervical spine. Chronic microvascular change. Mild cervical spondylosis. Electronically Signed   By: Inge Rise M.D.   On: 08/17/2019 13:27   CT Cervical Spine Wo Contrast  Result Date: 09/06/2019 CLINICAL DATA:  Posttraumatic headache. EXAM: CT HEAD WITHOUT CONTRAST CT CERVICAL SPINE WITHOUT CONTRAST TECHNIQUE: Multidetector CT imaging of the head and cervical spine was performed following the standard protocol without intravenous contrast. Multiplanar CT image reconstructions of the cervical spine were also generated. COMPARISON:  August 17, 2019. FINDINGS: CT  HEAD FINDINGS Brain: Mild chronic ischemic white matter disease is noted. No mass effect or midline shift is noted. Ventricular size is within normal limits. There is no evidence of mass lesion, hemorrhage or acute infarction. Vascular: No hyperdense vessel or unexpected calcification. Skull: Normal. Negative for fracture or focal lesion. Sinuses/Orbits: No acute finding. Other: None. CT CERVICAL SPINE FINDINGS Alignment: Mild grade 1 anterolisthesis of C4-5 is noted secondary to posterior facet joint hypertrophy. Skull base and vertebrae: No acute fracture. No primary bone lesion or focal pathologic process. Soft tissues and spinal canal: No prevertebral fluid or swelling. No visible canal hematoma. Disc levels:  Mild degenerative disc disease is noted at C4-5. Upper chest: Negative. Other: Degenerative changes are seen involving posterior facet joints bilaterally. IMPRESSION: 1. Mild chronic ischemic white matter disease. No acute intracranial abnormality seen. 2. Multilevel degenerative disc disease. No acute abnormality seen in the cervical spine. Electronically Signed   By: Marijo Conception M.D.   On: 09/06/2019 15:30   CT CERVICAL SPINE WO CONTRAST  Result Date: 08/17/2019 CLINICAL DATA:  Altered mental status for 2 days. EXAM: CT HEAD WITHOUT  CONTRAST CT CERVICAL SPINE WITHOUT CONTRAST TECHNIQUE: Multidetector CT imaging of the head and cervical spine was performed following the standard protocol without intravenous contrast. Multiplanar CT image reconstructions of the cervical spine were also generated. COMPARISON:  Cervical spine MRI 09/19/2008. Head CT scan 04/30/2019. FINDINGS: CT HEAD FINDINGS Brain: No evidence of acute infarction, hemorrhage, hydrocephalus, extra-axial collection or mass lesion/mass effect. Mild chronic microvascular ischemic change again seen. Vascular: Atherosclerosis. Skull: Intact.  No focal lesion. Sinuses/Orbits: Status post cataract surgery on the right. The patient is also status post right maxillary antrostomy. Other: None. Exaggeration of the normal cervical lordosis. 0.3 cm anterolisthesis C4 on C5 due to facet arthropathy. CT CERVICAL SPINE FINDINGS Alignment: There is some exaggeration of the normal cervical lordosis. 0.3 cm anterolisthesis C4 on C5 due to facet arthropathy. Skull base and vertebrae: No acute fracture. No primary bone lesion or focal pathologic process. Soft tissues and spinal canal: No prevertebral fluid or swelling. No visible canal hematoma. Disc levels: There is some loss of disc space height at C4-5. Small disc bulges are seen at C5-6 and C6-7. Upper chest: Lung apices clear. Other: None. IMPRESSION: No acute abnormality head or cervical spine. Chronic microvascular change. Mild cervical spondylosis. Electronically Signed   By: Inge Rise M.D.   On: 08/17/2019 13:27   NM Pulmonary Perfusion  Addendum Date: 09/07/2019   ADDENDUM REPORT: 09/07/2019 00:34 ADDENDUM: I discussed critical Value/emergent results were by telephone at the time of interpretation on 09/07/2019 at 21:50 pm to provider Dr. Laverta Baltimore, who verbally acknowledged these results. Electronically Signed   By: Revonda Humphrey   On: 09/07/2019 00:34   Result Date: 09/07/2019 CLINICAL DATA:  Dyspnea and concern for pulmonary embolism.  EXAM: NUCLEAR MEDICINE PERFUSION LUNG SCAN TECHNIQUE: Perfusion images were obtained in multiple projections after intravenous injection of radiopharmaceutical. Ventilation scans intentionally deferred if perfusion scan and chest x-ray adequate for interpretation during COVID-19 epidemic. A technical note states the patient is nonresponsive and lateral scintigraphic images were not acquired. RADIOPHARMACEUTICALS:  1.56 mCi Tc-27m MAA IV COMPARISON:  September 11, 2005. Chest radiography performed earlier on the same date. FINDINGS: There is a large left basilar wedge-shaped perfusion defect that is larger than the area of patchy left medial basilar consolidation on radiography, the perfusion-radiograph mismatch equal to at least 2 pulmonary segments. Ventilation images were deferred. A stat PRA  communication was initiated. IMPRESSION: Nuclear medicine perfusion imaging with high probability of pulmonary embolism according to the modified PIOPED II criteria. Electronically Signed: By: Revonda Humphrey On: 09/06/2019 21:45   US RENAL  Result Date: 09/07/2019 CLINICAL DATA:  Acute renal injury EXAM: RENAL / URINARY TRACT ULTRASOUND COMPLETE COMPARISON:  None. FINDINGS: Right Kidney: Renal measurements: 15.4 x 5.9 x 6.9 cm = volume: 330 mL. Two large anechoic cysts measuring 6.5 and 5.0 cm. Left Kidney: Renal measurements: 8.4 x 4.4 x 4.3 cm = volume: 83 mL. Thickened cortex. No hydronephrosis. No hydronephrosis Bladder: Appears normal for degree of bladder distention. Other: Gallbladder sludge IMPRESSION: 1. No renal obstruction.  No hydronephrosis. 2. Large benign appearing renal cyst of the RIGHT kidney. 3. Asymmetric renal size with the LEFT kidney is smaller than the RIGHT. Electronically Signed   By: Suzy Bouchard M.D.   On: 09/07/2019 14:59   DG Chest Port 1 View  Result Date: 09/06/2019 CLINICAL DATA:  Altered mental status, shortness of breath, COVID positive. EXAM: PORTABLE CHEST 1 VIEW COMPARISON:  Chest  radiograph 08/17/2019 FINDINGS: Stable cardiomediastinal contours with mildly enlarged heart size. Low volumes. Mild heterogeneous opacities at the left base could reflect atelectasis versus infiltrates. The right lung is clear. Coarsening of the interstitium bilaterally likely chronic bronchitic change. No pneumothorax or large pleural effusion. No acute finding in the visualized skeleton. IMPRESSION: Mild heterogeneous opacities at the left base could reflect atelectasis versus infiltrates. Electronically Signed   By: Audie Pinto M.D.   On: 09/06/2019 13:08   DG Chest Portable 1 View  Result Date: 08/17/2019 CLINICAL DATA:  Altered mental status for 2 days. EXAM: PORTABLE CHEST 1 VIEW COMPARISON:  Single-view of the chest 07/08/2009. FINDINGS: Hazy airspace disease is seen bilaterally, most notable in the upper lung zones and left lung base. No pneumothorax or pleural effusion. Heart size is mildly enlarged. No acute or focal bony abnormality. IMPRESSION: Hazy bilateral airspace disease has an appearance most worrisome for pneumonia. Electronically Signed   By: Inge Rise M.D.   On: 08/17/2019 13:16   DG Swallowing Func-Speech Pathology  Result Date: 09/08/2019 Objective Swallowing Evaluation: Type of Study: MBS-Modified Barium Swallow Study  Patient Details Name: Leah Waller MRN: [id number] Date of Birth: 1934/09/24 Today's Date: 09/08/2019 Time: SLP Start Time (ACUTE ONLY): 6967 -SLP Stop Time (ACUTE ONLY): 1438 SLP Time Calculation (min) (ACUTE ONLY): 21 min Past Medical History: Past Medical History: Diagnosis Date . Anxiety  . B12 deficiency  . Bipolar disorder ([place])  . Dementia ([place])  . Hypertension  . RBBB  Past Surgical History: No past surgical history on file. HPI: Pt is an 85 yo female admitted with AMS in the setting of AKI, hyponatremia, possible aspiration PNA, and concern for PE. Pt was recently admitted to [facility] for COVID-19 2/17-2/23. PMH also includes: dementia, HTN, CKD  stage III  Subjective: pt intermittently responding to questions or commands Assessment / Plan / Recommendation CHL IP CLINICAL IMPRESSIONS 09/08/2019 Clinical Impression Pt with history of dementia seen for MBS after treatment session concerning for aspiration. Moderate-severe oral and moderate pharyngeal dysphagia marked by inadequate laryngeal elevation, pharyngeal contraction and tongue base retraction. Puree bolus held in oral cavity 3 minutes despite lingual pressure with dry spoon/additional volume presentations and ultimately required suctioning for first trial. Strength of swallow was reduced in particular her laryngeal elevation, pharyngeal contraction resulting in vallecular residue and laryngeal penetration. Barium adhered to pharyngeal wall and fell into vestibule, close to vocal cords.  She sensed with slight throat clear and/or second swallow that only inconsistently removed penetrates. If pt consumed po's over the course of a meal the likelihood of aspiration is high. Given her dementia and age, long term alternative nutrition would not be in her best interests. Family may wish to pursue comfort feeds in which case Dys 1 (puree) and honey thick liquids. MD notified of recommendations and to speak with family. Continue NPO status for now.    SLP Visit Diagnosis Dysphagia, oropharyngeal phase (R13.12) Attention and concentration deficit following -- Frontal lobe and executive function deficit following -- Impact on safety and function Moderate aspiration risk;Risk for inadequate nutrition/hydration   CHL IP TREATMENT RECOMMENDATION 09/08/2019 Treatment Recommendations Therapy as outlined in treatment plan below   Prognosis 09/08/2019 Prognosis for Safe Diet Advancement Fair Barriers to Reach Goals Cognitive deficits Barriers/Prognosis Comment -- CHL IP DIET RECOMMENDATION 09/08/2019 SLP Diet Recommendations NPO Liquid Administration via -- Medication Administration -- Compensations -- Postural Changes --    CHL IP OTHER RECOMMENDATIONS 09/08/2019 Recommended Consults -- Oral Care Recommendations Oral care QID Other Recommendations --   CHL IP FOLLOW UP RECOMMENDATIONS 09/08/2019 Follow up Recommendations Skilled Nursing facility   [facility] IP FREQUENCY AND DURATION 09/08/2019 Speech Therapy Frequency (ACUTE ONLY) min 1 x/week Treatment Duration 2 weeks      CHL IP ORAL PHASE 09/08/2019 Oral Phase Impaired Oral - Pudding Teaspoon -- Oral - Pudding Cup -- Oral - Honey Teaspoon -- Oral - Honey Cup Decreased bolus cohesion;Delayed oral transit;Other (Comment);Left pocketing in lateral sulci;Right pocketing in lateral sulci;Lingual/palatal residue;Holding of bolus;Reduced posterior propulsion Oral - Nectar Teaspoon -- Oral - Nectar Cup -- Oral - Nectar Straw -- Oral - Thin Teaspoon -- Oral - Thin Cup -- Oral - Thin Straw -- Oral - Puree Decreased bolus cohesion;Delayed oral transit;Other (Comment);Left pocketing in lateral sulci;Right pocketing in lateral sulci;Lingual/palatal residue;Holding of bolus;Reduced posterior propulsion Oral - Mech Soft -- Oral - Regular -- Oral - Multi-Consistency -- Oral - Pill -- Oral Phase - Comment --  CHL IP PHARYNGEAL PHASE 09/08/2019 Pharyngeal Phase Impaired Pharyngeal- Pudding Teaspoon -- Pharyngeal -- Pharyngeal- Pudding Cup -- Pharyngeal -- Pharyngeal- Honey Teaspoon -- Pharyngeal -- Pharyngeal- Honey Cup Reduced epiglottic inversion;Reduced laryngeal elevation;Penetration/Aspiration during swallow;Pharyngeal residue - valleculae;Reduced pharyngeal peristalsis;Pharyngeal residue - pyriform;Pharyngeal residue - posterior pharnyx Pharyngeal Material enters airway, remains ABOVE vocal cords and not ejected out Pharyngeal- Nectar Teaspoon -- Pharyngeal -- Pharyngeal- Nectar Cup -- Pharyngeal -- Pharyngeal- Nectar Straw -- Pharyngeal -- Pharyngeal- Thin Teaspoon -- Pharyngeal -- Pharyngeal- Thin Cup -- Pharyngeal -- Pharyngeal- Thin Straw -- Pharyngeal -- Pharyngeal- Puree Reduced pharyngeal  peristalsis;Reduced epiglottic inversion;Reduced laryngeal elevation;Reduced airway/laryngeal closure;Pharyngeal residue - pyriform;Pharyngeal residue - valleculae;Pharyngeal residue - posterior pharnyx;Penetration/Apiration after swallow Pharyngeal Material enters airway, remains ABOVE vocal cords then ejected out Pharyngeal- Mechanical Soft -- Pharyngeal -- Pharyngeal- Regular -- Pharyngeal -- Pharyngeal- Multi-consistency -- Pharyngeal -- Pharyngeal- Pill -- Pharyngeal -- Pharyngeal Comment --  CHL IP CERVICAL ESOPHAGEAL PHASE 09/08/2019 Cervical Esophageal Phase WFL Pudding Teaspoon -- Pudding Cup -- Honey Teaspoon -- Honey Cup -- Nectar Teaspoon -- Nectar Cup -- Nectar Straw -- Thin Teaspoon -- Thin Cup -- Thin Straw -- Puree -- Mechanical Soft -- Regular -- Multi-consistency -- Pill -- Cervical Esophageal Comment -- Leah Waller 09/08/2019, 3:52 PM Leah Waller M.Ed Actor Pager [phone] Office [phone]              ECHOCARDIOGRAM COMPLETE  Result Date: 08/20/2019    ECHOCARDIOGRAM REPORT  Patient Name:   Leah Waller [facility] Date of Exam: 08/20/2019 Medical Rec #:  [id number]            Height:       64.0 in Accession #:    [id number]           Weight:       149.9 lb Date of Birth:  [birth year]             BSA:          1.73 m Patient Age:    40 years             BP:           157/58 mmHg Patient Gender: F                    HR:           50 bpm. Exam Location:  Inpatient Procedure: 2D Echo Indications:    Bradycardia  History:        Patient has no prior history of Echocardiogram examinations.                 Risk Factors:Hypertension. COVID-19 virus infection                 Chronic kidney disease.  Sonographer:    Vikki Ports Turrentine Referring Phys: [id number] [place] Comments: Suboptimal subcostal window. IMPRESSIONS  1. Left ventricular ejection fraction, by estimation, is 45 to 50%. The left ventricle has mildly decreased function. The left  ventricle demonstrates global hypokinesis. Left ventricular diastolic parameters are consistent with Grade I diastolic dysfunction (impaired relaxation).  2. Right ventricular systolic function is normal. The right ventricular size is normal. Tricuspid regurgitation signal is inadequate for assessing PA pressure.  3. Left atrial size was mildly dilated.  4. The mitral valve is grossly normal. Mild mitral valve regurgitation. No evidence of mitral stenosis.  5. The aortic valve is tricuspid. Aortic valve regurgitation is mild. No aortic stenosis is present. Comparison(s): No prior Echocardiogram. FINDINGS  Left Ventricle: Left ventricular ejection fraction, by estimation, is 45 to 50%. The left ventricle has mildly decreased function. The left ventricle demonstrates global hypokinesis. The left ventricular internal cavity size was normal in size. There is  no left ventricular hypertrophy. Left ventricular diastolic parameters are consistent with Grade I diastolic dysfunction (impaired relaxation). Normal left ventricular filling pressure. Right Ventricle: The right ventricular size is normal. No increase in right ventricular wall thickness. Right ventricular systolic function is normal. Tricuspid regurgitation signal is inadequate for assessing PA pressure. Left Atrium: Left atrial size was mildly dilated. Right Atrium: Right atrial size was normal in size. Pericardium: A small pericardial effusion is present. The pericardial effusion is circumferential. Presence of pericardial fat pad. Mitral Valve: The mitral valve is grossly normal. Mild mitral valve regurgitation. No evidence of mitral valve stenosis. Tricuspid Valve: The tricuspid valve is grossly normal. Tricuspid valve regurgitation is trivial. Aortic Valve: The aortic valve is tricuspid. Aortic valve regurgitation is mild. No aortic stenosis is present. Pulmonic Valve: The pulmonic valve was grossly normal. Pulmonic valve regurgitation is trivial. Aorta: The  aortic root is normal in size and structure. Venous: The inferior vena cava was not well visualized. IAS/Shunts: No atrial level shunt detected by color flow Doppler.  LEFT VENTRICLE PLAX 2D LVIDd:         3.92 cm  Diastology LVIDs:         2.82 cm  LV e' lateral:   6.49 cm/s LV PW:         0.94 cm  LV E/e' lateral: 13.4 LV IVS:        0.87 cm  LV e' medial:    7.80 cm/s LVOT diam:     1.90 cm  LV E/e' medial:  11.1 LV SV:         62.66 ml LV SV Index:   20.75 LVOT Area:     2.84 cm  RIGHT VENTRICLE RV S prime:     12.00 cm/s TAPSE (M-mode): 2.7 cm LEFT ATRIUM             Index       RIGHT ATRIUM           Index LA diam:        3.80 cm 2.20 cm/m  RA Area:     17.80 cm LA Vol (A2C):   54.6 ml 31.55 ml/m RA Volume:   48.40 ml  27.96 ml/m LA Vol (A4C):   51.5 ml 29.76 ml/m LA Biplane Vol: 54.4 ml 31.43 ml/m  AORTIC VALVE LVOT Vmax:   73.90 cm/s LVOT Vmean:  53.400 cm/s LVOT VTI:    0.221 m  AORTA Ao Root diam: 2.80 cm MITRAL VALVE MV Area (PHT): 2.87 cm    SHUNTS MV Decel Time: 264 msec    Systemic VTI:  0.22 m MV E velocity: 86.80 cm/s  Systemic Diam: 1.90 cm MV A velocity: 87.80 cm/s MV E/A ratio:  0.99 Eleonore Chiquito MD Electronically signed by Eleonore Chiquito MD Signature Date/Time: 08/20/2019/10:52:05 AM    Final    DG Hip Unilat W or Wo Pelvis 2-3 Views Left  Result Date: 08/17/2019 CLINICAL DATA:  Altered mental status. The patient refuses to ambulate. No known injury. EXAM: DG HIP (WITH OR WITHOUT PELVIS) 2-3V LEFT COMPARISON:  None. FINDINGS: There is no evidence of hip fracture or dislocation. There is no evidence of arthropathy or other focal bone abnormality. IMPRESSION: Negative exam. Electronically Signed   By: Inge Rise M.D.   On: 08/17/2019 13:15   VAS [place] LOWER EXTREMITY VENOUS (DVT)  Result Date: 09/07/2019  Lower Venous DVTStudy Indications: Swelling, and SOB.  Limitations: Pt position and ability to cooperate. Performing Technologist: Antonieta Pert RDMS, RVT  Examination  Guidelines: A complete evaluation includes B-mode imaging, spectral Doppler, color Doppler, and power Doppler as needed of all accessible portions of each vessel. Bilateral testing is considered an integral part of a complete examination. Limited examinations for reoccurring indications may be performed as noted. The reflux portion of the exam is performed with the patient in reverse Trendelenburg.  +---------+---------------+---------+-----------+----------+-------------------+ RIGHT    CompressibilityPhasicitySpontaneityPropertiesThrombus Aging      +---------+---------------+---------+-----------+----------+-------------------+ CFV      Full           Yes      Yes                                      +---------+---------------+---------+-----------+----------+-------------------+ SFJ      Full                                                             +---------+---------------+---------+-----------+----------+-------------------+ FV  Prox  Full                                                             +---------+---------------+---------+-----------+----------+-------------------+ FV Mid   Full                                                             +---------+---------------+---------+-----------+----------+-------------------+ FV Distal                                             pt can not tolerate                                                       compression         +---------+---------------+---------+-----------+----------+-------------------+ PFV      Full                                                             +---------+---------------+---------+-----------+----------+-------------------+ POP                     Yes      Yes                  pt can not tolerate                                                       compression         +---------+---------------+---------+-----------+----------+-------------------+ PTV       Full                                                             +---------+---------------+---------+-----------+----------+-------------------+ PERO     Full                                                             +---------+---------------+---------+-----------+----------+-------------------+ GSV      Full                                                             +---------+---------------+---------+-----------+----------+-------------------+   +---------+---------------+---------+-----------+----------+--------------+  LEFT     CompressibilityPhasicitySpontaneityPropertiesThrombus Aging +---------+---------------+---------+-----------+----------+--------------+ CFV      Full           Yes      Yes                                 +---------+---------------+---------+-----------+----------+--------------+ SFJ      Full                                                        +---------+---------------+---------+-----------+----------+--------------+ FV Prox  Full                                                        +---------+---------------+---------+-----------+----------+--------------+ FV Mid   Full                                                        +---------+---------------+---------+-----------+----------+--------------+ FV DistalFull                                                        +---------+---------------+---------+-----------+----------+--------------+ PFV      Full                                                        +---------+---------------+---------+-----------+----------+--------------+ POP      Full           Yes      Yes                                 +---------+---------------+---------+-----------+----------+--------------+ PTV      Full                                                        +---------+---------------+---------+-----------+----------+--------------+ PERO     Full                                                         +---------+---------------+---------+-----------+----------+--------------+ GSV      Full                                                        +---------+---------------+---------+-----------+----------+--------------+  Summary: RIGHT: - There is no evidence of deep vein thrombosis in the lower extremity. However, portions of this examination were limited- see technologist comments above.  - No cystic structure found in the popliteal fossa.  LEFT: - There is no evidence of deep vein thrombosis in the lower extremity.  - No cystic structure found in the popliteal fossa.  *See table(s) above for measurements and observations. Electronically signed by Harold Barban MD on 09/07/2019 at 5:48:31 PM.    Final     Micro Results     Recent Results (from the past 240 hour(s))  Blood Culture (routine x 2)     Status: None   Collection Time: 09/06/19 12:55 PM   Specimen: BLOOD LEFT HAND  Result Value Ref Range Status   Specimen Description BLOOD LEFT HAND  Final   Special Requests   Final    BOTTLES DRAWN AEROBIC ONLY Blood Culture results may not be optimal due to an excessive volume of blood received in culture bottles   Culture   Final    NO GROWTH 5 DAYS Performed at [facility], [place] [address], [place], [place] [address]    Report Status 09/11/2019 FINAL  Final  Blood Culture (routine x 2)     Status: Abnormal   Collection Time: 09/06/19  1:05 PM   Specimen: BLOOD  Result Value Ref Range Status   Specimen Description BLOOD RIGHT ANTECUBITAL  Final   Special Requests   Final    BOTTLES DRAWN AEROBIC AND ANAEROBIC Blood Culture results may not be optimal due to an excessive volume of blood received in culture bottles   Culture  Setup Time   Final    AEROBIC BOTTLE ONLY GRAM POSITIVE COCCI IN CLUSTERS CRITICAL RESULT CALLED TO, READ BACK BY AND VERIFIED WITH: J CARNEY PHARMD 09/07/19 1721 JDW    Culture (A)  Final    STAPHYLOCOCCUS  SPECIES (COAGULASE NEGATIVE) THE SIGNIFICANCE OF ISOLATING THIS ORGANISM FROM A SINGLE SET OF BLOOD CULTURES WHEN MULTIPLE SETS ARE DRAWN IS UNCERTAIN. PLEASE NOTIFY THE MICROBIOLOGY DEPARTMENT WITHIN ONE WEEK IF SPECIATION AND SENSITIVITIES ARE REQUIRED. Performed at [facility], [place] [address], [place], [place] [address]    Report Status 09/08/2019 FINAL  Final  Urine culture     Status: Abnormal   Collection Time: 09/06/19 10:04 PM   Specimen: In/Out Cath Urine  Result Value Ref Range Status   Specimen Description IN/OUT CATH URINE  Final   Special Requests   Final    NONE Performed at [facility], [place] [address] [place], [place] [address]    Culture 1,000 COLONIES/mL ENTEROCOCCUS FAECALIS (A)  Final   Report Status 09/09/2019 FINAL  Final   Organism ID, Bacteria ENTEROCOCCUS FAECALIS (A)  Final      Susceptibility   Enterococcus faecalis - MIC*    AMPICILLIN <=2 SENSITIVE Sensitive     NITROFURANTOIN <=16 SENSITIVE Sensitive     VANCOMYCIN 1 SENSITIVE Sensitive     * 1,000 COLONIES/mL ENTEROCOCCUS FAECALIS  MRSA PCR Screening     Status: None   Collection Time: 09/08/19 11:39 AM   Specimen: Nasal Mucosa; Nasopharyngeal  Result Value Ref Range Status   MRSA by PCR NEGATIVE NEGATIVE Final    Comment:        The GeneXpert MRSA Assay (FDA approved for NASAL specimens only), is one component of a comprehensive MRSA colonization surveillance program. It is not intended to diagnose MRSA infection nor to guide  or monitor treatment for MRSA infections. Performed at [facility], [place] [address], [place], [place] [address]   Culture, blood (routine x 2)     Status: None (Preliminary result)   Collection Time: 09/09/19  7:40 AM   Specimen: BLOOD RIGHT WRIST  Result Value Ref Range Status   Specimen Description BLOOD RIGHT WRIST  Final   Special Requests   Final    BOTTLES DRAWN AEROBIC AND ANAEROBIC Blood Culture adequate volume   Culture NO GROWTH 4 DAYS   Final   Report Status PENDING  Incomplete  Culture, blood (routine x 2)     Status: None (Preliminary result)   Collection Time: 09/09/19  7:51 AM   Specimen: BLOOD  Result Value Ref Range Status   Specimen Description BLOOD LEFT ANTECUBITAL  Final   Special Requests   Final    BOTTLES DRAWN AEROBIC AND ANAEROBIC Blood Culture adequate volume   Culture NO GROWTH 4 DAYS  Final   Report Status PENDING  Incomplete    Today   Subjective    Leah Waller today has no headache,no chest abdominal pain,no new weakness tingling or numbness, feels much better     Objective   Blood pressure (!) 145/50, pulse 69, temperature 98.2 F (36.8 C), temperature source Oral, resp. rate 16, height 5\' 3"  (1.6 m), weight 69.5 kg, SpO2 98 %.   Intake/Output Summary (Last 24 hours) at 09/13/2019 1001 Last data filed at 09/12/2019 2149 Gross per 24 hour  Intake --  Output 1400 ml  Net -1400 ml    Exam  Awake , pleasantly confused, no new F.N deficits, Normal affect Peter.AT,PERRAL Supple Neck,No JVD, No cervical lymphadenopathy appriciated.  Symmetrical Chest wall movement, Good air movement bilaterally, CTAB RRR,No Gallops,Rubs or new Murmurs, No Parasternal Heave +ve B.Sounds, Abd Soft, Non tender, No organomegaly appriciated, No rebound -guarding or rigidity. No Cyanosis, bilateral chronic lower extremity contractures legs in contractures splints   Data Review   CBC w Diff:  Lab Results  Component Value Date   WBC 9.4 09/13/2019   HGB 7.7 (L) 09/13/2019   HCT 24.8 (L) 09/13/2019   HCT 26.9 (L) 09/07/2019   PLT 358 09/13/2019   LYMPHOPCT 15 09/13/2019   MONOPCT 12 09/13/2019   EOSPCT 2 09/13/2019   BASOPCT 0 09/13/2019    CMP:  Lab Results  Component Value Date   NA 146 (H) 09/12/2019   K 3.7 09/12/2019   CL 124 (H) 09/12/2019   CO2 16 (L) 09/12/2019   BUN 27 (H) 09/12/2019   CREATININE 1.18 (H) 09/12/2019   PROT 5.3 (L) 09/08/2019   ALBUMIN 1.3 (L) 09/08/2019   BILITOT 1.0  09/08/2019   ALKPHOS 74 09/08/2019   AST 20 09/08/2019   ALT 26 09/08/2019  .   Total Time in preparing paper work, data evaluation and todays exam - 78 minutes  Lala Lund M.D on 09/13/2019 at 10:01 AM  [facility]   Office  [phone]

## 2019-09-14 DIAGNOSIS — N179 Acute kidney failure, unspecified: Secondary | ICD-10-CM | POA: Diagnosis not present

## 2019-09-14 DIAGNOSIS — J690 Pneumonitis due to inhalation of food and vomit: Secondary | ICD-10-CM | POA: Diagnosis not present

## 2019-09-14 DIAGNOSIS — I2699 Other pulmonary embolism without acute cor pulmonale: Secondary | ICD-10-CM | POA: Diagnosis not present

## 2019-09-14 DIAGNOSIS — G9341 Metabolic encephalopathy: Secondary | ICD-10-CM | POA: Diagnosis not present

## 2019-09-14 LAB — CULTURE, BLOOD (ROUTINE X 2)
Culture: NO GROWTH
Culture: NO GROWTH
Special Requests: ADEQUATE
Special Requests: ADEQUATE

## 2019-09-16 DIAGNOSIS — R400 Somnolence: Secondary | ICD-10-CM | POA: Diagnosis not present

## 2019-09-16 DIAGNOSIS — J690 Pneumonitis due to inhalation of food and vomit: Secondary | ICD-10-CM | POA: Diagnosis not present

## 2019-09-16 DIAGNOSIS — I2699 Other pulmonary embolism without acute cor pulmonale: Secondary | ICD-10-CM | POA: Diagnosis not present

## 2019-09-16 DIAGNOSIS — U071 COVID-19: Secondary | ICD-10-CM | POA: Diagnosis not present

## 2019-09-17 DIAGNOSIS — J449 Chronic obstructive pulmonary disease, unspecified: Secondary | ICD-10-CM | POA: Diagnosis not present

## 2019-09-17 DIAGNOSIS — E870 Hyperosmolality and hypernatremia: Secondary | ICD-10-CM | POA: Diagnosis not present

## 2019-09-17 DIAGNOSIS — R0602 Shortness of breath: Secondary | ICD-10-CM | POA: Diagnosis not present

## 2019-09-17 DIAGNOSIS — R638 Other symptoms and signs concerning food and fluid intake: Secondary | ICD-10-CM | POA: Diagnosis not present

## 2019-09-17 DIAGNOSIS — N179 Acute kidney failure, unspecified: Secondary | ICD-10-CM | POA: Diagnosis not present

## 2019-09-17 DIAGNOSIS — D649 Anemia, unspecified: Secondary | ICD-10-CM | POA: Diagnosis not present

## 2019-09-20 DIAGNOSIS — R531 Weakness: Secondary | ICD-10-CM | POA: Diagnosis not present

## 2019-09-20 DIAGNOSIS — R400 Somnolence: Secondary | ICD-10-CM | POA: Diagnosis not present

## 2019-09-20 DIAGNOSIS — R05 Cough: Secondary | ICD-10-CM | POA: Diagnosis not present

## 2019-09-20 DIAGNOSIS — D649 Anemia, unspecified: Secondary | ICD-10-CM | POA: Diagnosis not present

## 2019-09-20 DIAGNOSIS — N179 Acute kidney failure, unspecified: Secondary | ICD-10-CM | POA: Diagnosis not present

## 2019-09-20 DIAGNOSIS — R630 Anorexia: Secondary | ICD-10-CM | POA: Diagnosis not present

## 2019-09-21 DIAGNOSIS — Z789 Other specified health status: Secondary | ICD-10-CM | POA: Diagnosis not present

## 2019-09-21 DIAGNOSIS — F0390 Unspecified dementia without behavioral disturbance: Secondary | ICD-10-CM | POA: Diagnosis not present

## 2019-09-21 DIAGNOSIS — E870 Hyperosmolality and hypernatremia: Secondary | ICD-10-CM | POA: Diagnosis not present

## 2019-09-21 DIAGNOSIS — N179 Acute kidney failure, unspecified: Secondary | ICD-10-CM | POA: Diagnosis not present

## 2019-09-22 ENCOUNTER — Non-Acute Institutional Stay: Payer: Medicare HMO | Admitting: Adult Health Nurse Practitioner

## 2019-09-22 ENCOUNTER — Other Ambulatory Visit: Payer: Self-pay

## 2019-09-22 DIAGNOSIS — F0150 Vascular dementia without behavioral disturbance: Secondary | ICD-10-CM

## 2019-09-22 DIAGNOSIS — Z515 Encounter for palliative care: Secondary | ICD-10-CM

## 2019-09-22 NOTE — Progress Notes (Signed)
[facility] Consult Note Telephone: [phone]  Fax: [phone]  PATIENT NAME: Leah Waller DOB: 1934/07/28 MRN: [id number]  PRIMARY CARE PROVIDER:  Dr. Toni Arthurs PROVIDER:  Dr. Aubery Lapping  RESPONSIBLE PARTY:   Marinus Maw, daughter  [phone]    RECOMMENDATIONS and PLAN:  1.  Advanced care planning.  Patient is a DNR  2.  Dementia. FAST 7b.  Patient is bed/wheelchair bound.  She requires total care with ADLs.  Is incontinent of B&B.  She has no reported wounds.  She was treated for COVID in hospital in February this year and was in hospital 3/8-3/15/2021 for pneumonia (possible due to aspiration), AKI, CHF.  Patient has been weak and not eating or drinking much since this past hospitalization.  She required IV fluids over the weekend due to dehydration.  Discussed with daughter her mother's decline and recommendation for hospice.  She had questions about continuing IV fluids and discussed with her that continual hydration was not something any facility would do and would not be continued with hospice.  Discussed that she would perk up for a little bit with IV fluids but her mother was still declining without adequate nutrition.  She expressed understanding and wanted to continue with hospice services.  Spoke with Dr. Aubery Lapping who is in agreement with hospice services and will send in order for hospice to the facility.  I spent 50 minutes providing this consultation,  from 10:00 to 10:50 including time with patient/family, chart review, provider coordination, and documentation. More than 50% of the time in this consultation was spent coordinating communication.   HISTORY OF PRESENT ILLNESS:  Leah Waller is a 85 y.o. year old female with multiple medical problems including dementia, CHF, CKD stage 3, anemia, HTN, bipolar, recent COVID infection. Palliative Care was asked to help address goals of care.   CODE  STATUS: DNR  PPS: 20% HOSPICE ELIGIBILITY/DIAGNOSIS: yes/dementia with decline after COVID infection  PHYSICAL EXAM:  HR 69  O2 98% on RA General: NAD, frail appearing Cardiovascular: regular rate and rhythm Pulmonary: lung sounds clear; normal respiratory effort Abdomen: soft, nontender, + bowel sounds GU: no suprapubic tenderness Extremities: no edema, no joint deformities Skin: no rashes on exposed skin Neurological: Weakness; Patient nonverbal; will answer yes/no question; able to follow commands   PAST MEDICAL HISTORY:  Past Medical History:  Diagnosis Date  . Anxiety   . B12 deficiency   . Bipolar disorder ([place])   . Dementia ([place])   . Hypertension   . RBBB     SOCIAL HX:  Social History   Tobacco Use  . Smoking status: Former Research scientist (life sciences)  . Smokeless tobacco: Never Used  Substance Use Topics  . Alcohol use: Not Currently    ALLERGIES:  Allergies  Allergen Reactions  . Aspirin Other (See Comments)    Unspecified  . Lisinopril Other (See Comments)     PERTINENT MEDICATIONS:  Outpatient Encounter Medications as of 09/22/2019  Medication Sig  . acetaminophen (TYLENOL) 500 MG tablet Take 500 mg by mouth every 4 (four) hours as needed for mild pain or fever.  Marland Kitchen alum & mag hydroxide-simeth (ALUMINA-MAGNESIA-SIMETHICONE) 200-200-20 MG/5ML suspension Take 30 mLs by mouth every 6 (six) hours as needed for indigestion or heartburn.  Marland Kitchen amLODipine (NORVASC) 10 MG tablet Take 1 tablet (10 mg total) by mouth daily.  Marland Kitchen apixaban (ELIQUIS) 5 MG TABS tablet Take 1 tablet (5 mg total) by mouth 2 (two) times daily.  Marland Kitchen  ascorbic acid (VITAMIN C) 500 MG tablet Take 1 tablet (500 mg total) by mouth daily.  . cyanocobalamin ([employer] VITAMIN B12) 1000 MCG tablet Take 1,000 mcg by mouth daily.   Marland Kitchen donepezil (ARICEPT) 5 MG tablet Take 5 mg by mouth at bedtime.  . ferrous sulfate 325 (65 FE) MG tablet Take 325 mg by mouth daily with breakfast.  . guaiFENesin (ROBITUSSIN) 100 MG/5ML liquid Take  200 mg by mouth every 6 (six) hours as needed for cough.  . Ipratropium-Albuterol (COMBIVENT) 20-100 MCG/ACT AERS respimat Inhale 1 puff into the lungs 4 (four) times daily.  Marland Kitchen loperamide (IMODIUM) 2 MG capsule Take 2 mg by mouth as needed for diarrhea or loose stools.  Marland Kitchen LORazepam (ATIVAN) 0.5 MG tablet Take 1 tablet (0.5 mg total) by mouth 2 (two) times daily as needed for anxiety.  Marland Kitchen losartan (COZAAR) 50 MG tablet Take 50 mg by mouth daily.  . magnesium hydroxide (MILK OF MAGNESIA) 400 MG/5ML suspension Take 30 mLs by mouth at bedtime as needed for mild constipation.  . mirtazapine (REMERON) 7.5 MG tablet Take 1 tablet (7.5 mg total) by mouth at bedtime. For sleep  . neomycin-bacitracin-polymyxin (NEOSPORIN) [phone] ointment Apply 1 application topically daily as needed (skin tears).  . ondansetron (ZOFRAN) 4 MG tablet Take 1 tablet (4 mg total) by mouth every 6 (six) hours as needed for nausea.  . pantoprazole (PROTONIX) 40 MG tablet Take 1 tablet (40 mg total) by mouth daily.  . risperiDONE (RISPERDAL) 0.5 MG tablet Take 1-1.5 tablets (0.5-0.75 mg total) by mouth as directed. Take 1 tablet in the AM and 1.5 tablets PM (Patient taking differently: Take 0.25-0.5 mg by mouth as directed. Take 1/2 tablet in the AM and 1 tablet PM)  . zinc sulfate 220 (50 Zn) MG capsule Take 1 capsule (220 mg total) by mouth daily.   No facility-administered encounter medications on file as of 09/22/2019.     [employer] Jenetta Downer, NP

## 2019-09-23 ENCOUNTER — Other Ambulatory Visit: Payer: Self-pay

## 2019-09-23 DIAGNOSIS — R630 Anorexia: Secondary | ICD-10-CM | POA: Diagnosis not present

## 2019-09-23 DIAGNOSIS — Z515 Encounter for palliative care: Secondary | ICD-10-CM | POA: Diagnosis not present

## 2019-09-23 DIAGNOSIS — R1310 Dysphagia, unspecified: Secondary | ICD-10-CM | POA: Diagnosis not present

## 2019-09-30 DIAGNOSIS — W19XXXA Unspecified fall, initial encounter: Secondary | ICD-10-CM | POA: Diagnosis not present

## 2019-09-30 DIAGNOSIS — M25571 Pain in right ankle and joints of right foot: Secondary | ICD-10-CM | POA: Diagnosis not present

## 2019-09-30 DIAGNOSIS — M79661 Pain in right lower leg: Secondary | ICD-10-CM | POA: Diagnosis not present

## 2019-09-30 DIAGNOSIS — M25561 Pain in right knee: Secondary | ICD-10-CM | POA: Diagnosis not present

## 2019-10-01 DIAGNOSIS — R531 Weakness: Secondary | ICD-10-CM | POA: Diagnosis not present

## 2019-10-01 DIAGNOSIS — W19XXXA Unspecified fall, initial encounter: Secondary | ICD-10-CM | POA: Diagnosis not present

## 2019-10-08 DIAGNOSIS — F0390 Unspecified dementia without behavioral disturbance: Secondary | ICD-10-CM | POA: Diagnosis not present

## 2019-10-08 DIAGNOSIS — G9341 Metabolic encephalopathy: Secondary | ICD-10-CM | POA: Diagnosis not present

## 2019-10-08 DIAGNOSIS — R1310 Dysphagia, unspecified: Secondary | ICD-10-CM | POA: Diagnosis not present

## 2019-10-08 DIAGNOSIS — I5042 Chronic combined systolic (congestive) and diastolic (congestive) heart failure: Secondary | ICD-10-CM | POA: Diagnosis not present

## 2019-10-11 DIAGNOSIS — W06XXXA Fall from bed, initial encounter: Secondary | ICD-10-CM | POA: Diagnosis not present

## 2019-10-11 DIAGNOSIS — I10 Essential (primary) hypertension: Secondary | ICD-10-CM | POA: Diagnosis not present

## 2019-10-13 DIAGNOSIS — I10 Essential (primary) hypertension: Secondary | ICD-10-CM | POA: Diagnosis not present

## 2019-10-13 DIAGNOSIS — R001 Bradycardia, unspecified: Secondary | ICD-10-CM | POA: Diagnosis not present

## 2019-10-15 ENCOUNTER — Other Ambulatory Visit: Payer: Self-pay

## 2019-10-15 ENCOUNTER — Non-Acute Institutional Stay: Payer: Medicare HMO | Admitting: Adult Health Nurse Practitioner

## 2019-10-15 DIAGNOSIS — F0150 Vascular dementia without behavioral disturbance: Secondary | ICD-10-CM

## 2019-10-15 DIAGNOSIS — Z515 Encounter for palliative care: Secondary | ICD-10-CM

## 2019-10-15 NOTE — Progress Notes (Signed)
[facility] Consult Note Telephone: [phone]  Fax: [phone]  PATIENT NAME: Leah Waller DOB: 10-Jan-1935 MRN: [id number]  PRIMARY CARE PROVIDER:   Dr. Toni Arthurs PROVIDER:  Dr. Aubery Lapping  RESPONSIBLE PARTY:   Marinus Maw, daughter  [phone]    RECOMMENDATIONS and PLAN:  1.  Advanced care planning.  Patient is a DNR.  Left VM with daughter to update on visit.  Left contact info for call back  2.  Dementia. FAST 7b.  Patient is bed/wheelchair bound.  She requires total care with ADLs.  Is incontinent of B&B.  She has no reported wounds. Staff reports that she has days where she will eat better than other days.  No reported weight loss.  She did have an unwitnessed fall 4 days ago in which she obtained hematoma to forehead.  She was found tangled in her blanket.  Patient has not had any infection or hospital visits since last visit.  Continue supportive care at facility.   Of note, patient has been referred to hospice and is currently finishing up on skilled days.  Reports that she is not participating well with therapy.  Hopes to transition to hospice once finished with skilled days  Palliative will continue to monitor for symptom management/decline and make recommendations as needed.  Will follow up in 2 weeks.  I spent 30 minutes providing this consultation,  from 10:45 to 11:15 including time spent with patient/family, chart review, provider coordination, documentation. More than 50% of the time in this consultation was spent coordinating communication.   HISTORY OF PRESENT ILLNESS:  Leah Waller is a 85 y.o. year old female with multiple medical problems including dementia, CHF, CKD stage 3, anemia, HTN, bipolar, recent COVID infection. Palliative Care was asked to help address goals of care.   CODE STATUS: DNR  PPS: 20% HOSPICE ELIGIBILITY/DIAGNOSIS: yes/dementia with decline after COVID infection  PHYSICAL EXAM:  HR 46  O2 99% on RA General: NAD, frail appearing Cardiovascular: regular rate and rhythm Pulmonary: lung sounds clear; normal respiratory effort Abdomen: soft, nontender, + bowel sounds GU: no suprapubic tenderness Extremities: no edema, no joint deformities Skin: no rashes on exposed skin; has ecchymosis to forehead post fall Neurological: Weakness; Patient nonverbal; will answer yes/no question; able to follow commands  PAST MEDICAL HISTORY:  Past Medical History:  Diagnosis Date  . Anxiety   . B12 deficiency   . Bipolar disorder ([place])   . Dementia ([place])   . Hypertension   . RBBB     SOCIAL HX:  Social History   Tobacco Use  . Smoking status: Former Research scientist (life sciences)  . Smokeless tobacco: Never Used  Substance Use Topics  . Alcohol use: Not Currently    ALLERGIES:  Allergies  Allergen Reactions  . Aspirin Other (See Comments)    Unspecified  . Lisinopril Other (See Comments)     PERTINENT MEDICATIONS:  Outpatient Encounter Medications as of 10/15/2019  Medication Sig  . acetaminophen (TYLENOL) 500 MG tablet Take 500 mg by mouth every 4 (four) hours as needed for mild pain or fever.  Marland Kitchen alum & mag hydroxide-simeth (ALUMINA-MAGNESIA-SIMETHICONE) 200-200-20 MG/5ML suspension Take 30 mLs by mouth every 6 (six) hours as needed for indigestion or heartburn.  Marland Kitchen amLODipine (NORVASC) 10 MG tablet Take 1 tablet (10 mg total) by mouth daily.  Marland Kitchen apixaban (ELIQUIS) 5 MG TABS tablet Take 1 tablet (5 mg total) by mouth 2 (two) times daily.  Marland Kitchen ascorbic acid (VITAMIN  C) 500 MG tablet Take 1 tablet (500 mg total) by mouth daily.  . cyanocobalamin ([facility] VITAMIN B12) 1000 MCG tablet Take 1,000 mcg by mouth daily.   Marland Kitchen donepezil (ARICEPT) 5 MG tablet Take 5 mg by mouth at bedtime.  . ferrous sulfate 325 (65 FE) MG tablet Take 325 mg by mouth daily with breakfast.  . guaiFENesin (ROBITUSSIN) 100 MG/5ML liquid Take 200 mg by mouth every 6 (six) hours as needed for cough.  .  Ipratropium-Albuterol (COMBIVENT) 20-100 MCG/ACT AERS respimat Inhale 1 puff into the lungs 4 (four) times daily.  Marland Kitchen loperamide (IMODIUM) 2 MG capsule Take 2 mg by mouth as needed for diarrhea or loose stools.  Marland Kitchen LORazepam (ATIVAN) 0.5 MG tablet Take 1 tablet (0.5 mg total) by mouth 2 (two) times daily as needed for anxiety.  Marland Kitchen losartan (COZAAR) 50 MG tablet Take 50 mg by mouth daily.  . magnesium hydroxide (MILK OF MAGNESIA) 400 MG/5ML suspension Take 30 mLs by mouth at bedtime as needed for mild constipation.  . mirtazapine (REMERON) 7.5 MG tablet Take 1 tablet (7.5 mg total) by mouth at bedtime. For sleep  . neomycin-bacitracin-polymyxin (NEOSPORIN) [phone] ointment Apply 1 application topically daily as needed (skin tears).  . ondansetron (ZOFRAN) 4 MG tablet Take 1 tablet (4 mg total) by mouth every 6 (six) hours as needed for nausea.  . pantoprazole (PROTONIX) 40 MG tablet Take 1 tablet (40 mg total) by mouth daily.  . risperiDONE (RISPERDAL) 0.5 MG tablet Take 1-1.5 tablets (0.5-0.75 mg total) by mouth as directed. Take 1 tablet in the AM and 1.5 tablets PM (Patient taking differently: Take 0.25-0.5 mg by mouth as directed. Take 1/2 tablet in the AM and 1 tablet PM)  . zinc sulfate 220 (50 Zn) MG capsule Take 1 capsule (220 mg total) by mouth daily.   No facility-administered encounter medications on file as of 10/15/2019.      Aizlyn Schifano Jenetta Downer, NP

## 2019-10-18 ENCOUNTER — Other Ambulatory Visit: Payer: Self-pay

## 2019-10-18 ENCOUNTER — Encounter (HOSPITAL_COMMUNITY): Payer: Self-pay

## 2019-10-18 ENCOUNTER — Inpatient Hospital Stay (HOSPITAL_COMMUNITY)
Admission: EM | Admit: 2019-10-18 | Discharge: 2019-10-20 | DRG: 951 | Disposition: A | Discharge status: Hospice | Payer: Medicare HMO | Attending: Internal Medicine | Admitting: Internal Medicine

## 2019-10-18 DIAGNOSIS — Z7901 Long term (current) use of anticoagulants: Secondary | ICD-10-CM

## 2019-10-18 DIAGNOSIS — F419 Anxiety disorder, unspecified: Secondary | ICD-10-CM | POA: Diagnosis not present

## 2019-10-18 DIAGNOSIS — F0390 Unspecified dementia without behavioral disturbance: Secondary | ICD-10-CM | POA: Diagnosis not present

## 2019-10-18 DIAGNOSIS — R1110 Vomiting, unspecified: Secondary | ICD-10-CM | POA: Diagnosis not present

## 2019-10-18 DIAGNOSIS — Z20822 Contact with and (suspected) exposure to covid-19: Secondary | ICD-10-CM | POA: Diagnosis present

## 2019-10-18 DIAGNOSIS — I4510 Unspecified right bundle-branch block: Secondary | ICD-10-CM | POA: Diagnosis present

## 2019-10-18 DIAGNOSIS — N179 Acute kidney failure, unspecified: Secondary | ICD-10-CM | POA: Diagnosis present

## 2019-10-18 DIAGNOSIS — F319 Bipolar disorder, unspecified: Secondary | ICD-10-CM | POA: Diagnosis present

## 2019-10-18 DIAGNOSIS — S0990XA Unspecified injury of head, initial encounter: Secondary | ICD-10-CM

## 2019-10-18 DIAGNOSIS — W19XXXA Unspecified fall, initial encounter: Secondary | ICD-10-CM | POA: Diagnosis not present

## 2019-10-18 DIAGNOSIS — D631 Anemia in chronic kidney disease: Secondary | ICD-10-CM | POA: Diagnosis present

## 2019-10-18 DIAGNOSIS — S0083XA Contusion of other part of head, initial encounter: Secondary | ICD-10-CM | POA: Diagnosis present

## 2019-10-18 DIAGNOSIS — Z8616 Personal history of COVID-19: Secondary | ICD-10-CM

## 2019-10-18 DIAGNOSIS — R159 Full incontinence of feces: Secondary | ICD-10-CM | POA: Diagnosis present

## 2019-10-18 DIAGNOSIS — Z66 Do not resuscitate: Secondary | ICD-10-CM | POA: Diagnosis present

## 2019-10-18 DIAGNOSIS — K921 Melena: Secondary | ICD-10-CM | POA: Diagnosis not present

## 2019-10-18 DIAGNOSIS — R6520 Severe sepsis without septic shock: Secondary | ICD-10-CM | POA: Diagnosis not present

## 2019-10-18 DIAGNOSIS — Z7401 Bed confinement status: Secondary | ICD-10-CM | POA: Diagnosis not present

## 2019-10-18 DIAGNOSIS — A419 Sepsis, unspecified organism: Secondary | ICD-10-CM | POA: Diagnosis present

## 2019-10-18 DIAGNOSIS — I5042 Chronic combined systolic (congestive) and diastolic (congestive) heart failure: Secondary | ICD-10-CM | POA: Diagnosis not present

## 2019-10-18 DIAGNOSIS — Z993 Dependence on wheelchair: Secondary | ICD-10-CM

## 2019-10-18 DIAGNOSIS — I959 Hypotension, unspecified: Secondary | ICD-10-CM | POA: Diagnosis present

## 2019-10-18 DIAGNOSIS — Z8701 Personal history of pneumonia (recurrent): Secondary | ICD-10-CM

## 2019-10-18 DIAGNOSIS — N184 Chronic kidney disease, stage 4 (severe): Secondary | ICD-10-CM | POA: Diagnosis present

## 2019-10-18 DIAGNOSIS — I4430 Unspecified atrioventricular block: Secondary | ICD-10-CM | POA: Diagnosis not present

## 2019-10-18 DIAGNOSIS — I130 Hypertensive heart and chronic kidney disease with heart failure and stage 1 through stage 4 chronic kidney disease, or unspecified chronic kidney disease: Secondary | ICD-10-CM | POA: Diagnosis not present

## 2019-10-18 DIAGNOSIS — F329 Major depressive disorder, single episode, unspecified: Secondary | ICD-10-CM | POA: Diagnosis not present

## 2019-10-18 DIAGNOSIS — E875 Hyperkalemia: Secondary | ICD-10-CM | POA: Diagnosis present

## 2019-10-18 DIAGNOSIS — R001 Bradycardia, unspecified: Secondary | ICD-10-CM | POA: Diagnosis present

## 2019-10-18 DIAGNOSIS — I440 Atrioventricular block, first degree: Secondary | ICD-10-CM | POA: Diagnosis not present

## 2019-10-18 DIAGNOSIS — K922 Gastrointestinal hemorrhage, unspecified: Secondary | ICD-10-CM

## 2019-10-18 DIAGNOSIS — Z87891 Personal history of nicotine dependence: Secondary | ICD-10-CM

## 2019-10-18 DIAGNOSIS — Y92129 Unspecified place in nursing home as the place of occurrence of the external cause: Secondary | ICD-10-CM | POA: Diagnosis not present

## 2019-10-18 DIAGNOSIS — Z515 Encounter for palliative care: Principal | ICD-10-CM

## 2019-10-18 DIAGNOSIS — Z79899 Other long term (current) drug therapy: Secondary | ICD-10-CM

## 2019-10-18 DIAGNOSIS — E538 Deficiency of other specified B group vitamins: Secondary | ICD-10-CM | POA: Diagnosis present

## 2019-10-18 DIAGNOSIS — Z888 Allergy status to other drugs, medicaments and biological substances status: Secondary | ICD-10-CM

## 2019-10-18 DIAGNOSIS — R32 Unspecified urinary incontinence: Secondary | ICD-10-CM | POA: Diagnosis present

## 2019-10-18 DIAGNOSIS — Z886 Allergy status to analgesic agent status: Secondary | ICD-10-CM

## 2019-10-18 DIAGNOSIS — E872 Acidosis, unspecified: Secondary | ICD-10-CM

## 2019-10-18 DIAGNOSIS — R4189 Other symptoms and signs involving cognitive functions and awareness: Secondary | ICD-10-CM | POA: Diagnosis not present

## 2019-10-18 DIAGNOSIS — D649 Anemia, unspecified: Secondary | ICD-10-CM

## 2019-10-18 LAB — CBC WITH DIFFERENTIAL/PLATELET
Abs Immature Granulocytes: 0.03 10*3/uL (ref 0.00–0.07)
Basophils Absolute: 0 10*3/uL (ref 0.0–0.1)
Basophils Relative: 0 %
Eosinophils Absolute: 0 10*3/uL (ref 0.0–0.5)
Eosinophils Relative: 0 %
HCT: 26.9 % — ABNORMAL LOW (ref 36.0–46.0)
Hemoglobin: 7.8 g/dL — ABNORMAL LOW (ref 12.0–15.0)
Immature Granulocytes: 1 %
Lymphocytes Relative: 7 %
Lymphs Abs: 0.4 10*3/uL — ABNORMAL LOW (ref 0.7–4.0)
MCH: 30.5 pg (ref 26.0–34.0)
MCHC: 29 g/dL — ABNORMAL LOW (ref 30.0–36.0)
MCV: 105.1 fL — ABNORMAL HIGH (ref 80.0–100.0)
Monocytes Absolute: 0.3 10*3/uL (ref 0.1–1.0)
Monocytes Relative: 5 %
Neutro Abs: 5.4 10*3/uL (ref 1.7–7.7)
Neutrophils Relative %: 87 %
Platelets: 113 10*3/uL — ABNORMAL LOW (ref 150–400)
RBC: 2.56 MIL/uL — ABNORMAL LOW (ref 3.87–5.11)
RDW: 18.3 % — ABNORMAL HIGH (ref 11.5–15.5)
WBC: 6.2 10*3/uL (ref 4.0–10.5)
nRBC: 0.5 % — ABNORMAL HIGH (ref 0.0–0.2)

## 2019-10-18 LAB — LIPASE, BLOOD: Lipase: 41 U/L (ref 11–51)

## 2019-10-18 LAB — COMPREHENSIVE METABOLIC PANEL
ALT: 28 U/L (ref 0–44)
AST: 26 U/L (ref 15–41)
Albumin: 2.3 g/dL — ABNORMAL LOW (ref 3.5–5.0)
Alkaline Phosphatase: 87 U/L (ref 38–126)
Anion gap: 11 (ref 5–15)
BUN: 57 mg/dL — ABNORMAL HIGH (ref 8–23)
CO2: 18 mmol/L — ABNORMAL LOW (ref 22–32)
Calcium: 7.8 mg/dL — ABNORMAL LOW (ref 8.9–10.3)
Chloride: 117 mmol/L — ABNORMAL HIGH (ref 98–111)
Creatinine, Ser: 2.39 mg/dL — ABNORMAL HIGH (ref 0.44–1.00)
GFR calc Af Amer: 21 mL/min — ABNORMAL LOW (ref >60)
GFR calc non Af Amer: 18 mL/min — ABNORMAL LOW (ref >60)
Glucose, Bld: 95 mg/dL (ref 70–99)
Potassium: 6 mmol/L — ABNORMAL HIGH (ref 3.5–5.1)
Sodium: 146 mmol/L — ABNORMAL HIGH (ref 135–145)
Total Bilirubin: 0.5 mg/dL (ref 0.3–1.2)
Total Protein: 6 g/dL — ABNORMAL LOW (ref 6.5–8.1)

## 2019-10-18 LAB — PROTIME-INR
INR: 1.6 — ABNORMAL HIGH (ref 0.8–1.2)
Prothrombin Time: 19 seconds — ABNORMAL HIGH (ref 11.4–15.2)

## 2019-10-18 LAB — RESPIRATORY PANEL BY RT PCR (FLU A&B, COVID)
Influenza A by PCR: NEGATIVE
Influenza B by PCR: NEGATIVE
SARS Coronavirus 2 by RT PCR: NEGATIVE

## 2019-10-18 LAB — POC OCCULT BLOOD, ED: Fecal Occult Bld: POSITIVE — AB

## 2019-10-18 MED ORDER — GLYCOPYRROLATE 0.2 MG/ML IJ SOLN
0.2000 mg | INTRAMUSCULAR | Status: DC | PRN
  Administered 2019-10-20: 21:00:00 0.2 mg via INTRAVENOUS
  Filled 2019-10-18: qty 1

## 2019-10-18 MED ORDER — LORAZEPAM 2 MG/ML IJ SOLN: 1.0000 mg | INTRAMUSCULAR | Status: DC | PRN

## 2019-10-18 MED ORDER — ACETAMINOPHEN 650 MG RE SUPP: 650.0000 mg | Freq: Four times a day (QID) | RECTAL | Status: DC | PRN

## 2019-10-18 MED ORDER — LORAZEPAM 2 MG/ML PO CONC: 1.0000 mg | ORAL | Status: DC | PRN

## 2019-10-18 MED ORDER — ACETAMINOPHEN 325 MG PO TABS: 650.0000 mg | ORAL_TABLET | Freq: Four times a day (QID) | ORAL | Status: DC | PRN

## 2019-10-18 MED ORDER — ALBUTEROL SULFATE (2.5 MG/3ML) 0.083% IN NEBU: 2.5000 mg | INHALATION_SOLUTION | RESPIRATORY_TRACT | Status: DC | PRN

## 2019-10-18 MED ORDER — MORPHINE SULFATE (PF) 2 MG/ML IV SOLN: 1.0000 mg | INTRAVENOUS | Status: DC | PRN

## 2019-10-18 MED ORDER — LORAZEPAM 1 MG PO TABS: 1.0000 mg | ORAL_TABLET | ORAL | Status: DC | PRN

## 2019-10-18 MED ORDER — GLYCOPYRROLATE 0.2 MG/ML IJ SOLN: 0.2000 mg | INTRAMUSCULAR | Status: DC | PRN

## 2019-10-18 MED ORDER — GLYCOPYRROLATE 1 MG PO TABS
1.0000 mg | ORAL_TABLET | ORAL | Status: DC | PRN
  Filled 2019-10-18: qty 1

## 2019-10-18 NOTE — Progress Notes (Signed)
Arrived to room 6n25 from ED at this time. Nonverbal, opens eyes occasionally. Purewick placed.  Will continue to monitor.

## 2019-10-18 NOTE — ED Provider Notes (Signed)
[facility] EMERGENCY DEPARTMENT Provider Note   CSN: [id number] Arrival date & time: 10/18/19  1521     History Chief Complaint  Patient presents with  . Melena    Leah Waller is a 85 y.o. female.  Presents to emergency department from facility after having several days of dark tarry stools, also concern for possible fall on Saturday.  Recent hospitalization in March, discharged with palliative care services through with your care, discharged to SNF.  Recent notable events COVID-19 in February, encephalopathy, AKI, hypernatremia, pneumonia, possible PE started on anticoagulation.  Baseline dementia.  History limited due to acuity, dementia.  Additional history obtained via chart review, discussion with daughter at bedside.  Daughter reports patient had been made DNR.  She is interested in pursuing comfort measures only at this time.  HPI     Past Medical History:  Diagnosis Date  . Anxiety   . B12 deficiency   . Bipolar disorder ([place])   . Dementia ([place])   . Hypertension   . RBBB     Patient Active Problem List   Diagnosis Date Noted  . End of life care 10/18/2019  . Severe sepsis ([place]) 10/18/2019  . Metabolic acidosis 37/03/6268  . Bradycardia 10/18/2019  . Head injury 10/18/2019  . GI bleed 10/18/2019  . Sepsis ([place]) 09/06/2019  . Hyperchloremia 09/06/2019  . Hyperkalemia 09/06/2019  . Acute hypoxemic respiratory failure ([place]) 09/06/2019  . Dementia without behavioral disturbance ([place]) 08/18/2019  . Bipolar 1 disorder ([place]) 08/18/2019  . COVID-19 virus infection 08/17/2019  . Acute metabolic encephalopathy 48/54/6270  . Frequent falls 08/17/2019  . Depression with anxiety 08/17/2019  . Iron deficiency anemia 08/17/2019  . AKI (acute kidney injury) ([place]) 08/17/2019  . Acute respiratory disease due to COVID-19 virus 08/17/2019  . Pneumonia due to COVID-19 virus 08/17/2019  . Dehydration with hypernatremia 08/17/2019  . Acute  cystitis 08/17/2019  . DNR (do not resuscitate) 08/17/2019  . Memory deficit 04/14/2018  . Essential hypertension 04/14/2018  . Hyperlipidemia 04/14/2018  . Anxiety 04/14/2018  . Alzheimer's dementia with behavioral disturbance ([place]) 12/22/2017  . Low vitamin B12 level 10/11/2014    History reviewed. No pertinent surgical history.   OB History   No obstetric history on file.     Family History  Family history unknown: Yes    Social History   Tobacco Use  . Smoking status: Former Research scientist (life sciences)  . Smokeless tobacco: Never Used  Substance Use Topics  . Alcohol use: Not Currently  . Drug use: Not Currently    Home Medications Prior to Admission medications   Medication Sig Start Date End Date Taking? Authorizing Provider  acetaminophen (TYLENOL) 325 MG tablet Take 650 mg by mouth every 8 (eight) hours.   Yes [provider]  acetaminophen (TYLENOL) 500 MG tablet Take 500 mg by mouth every 4 (four) hours as needed for mild pain or fever.   Yes [provider]  alum & mag hydroxide-simeth (ALUMINA-MAGNESIA-SIMETHICONE) 200-200-20 MG/5ML suspension Take 30 mLs by mouth every 6 (six) hours as needed for indigestion or heartburn.   Yes [provider]  amLODipine (NORVASC) 5 MG tablet Take 7.5 mg by mouth daily.   Yes [provider]  apixaban (ELIQUIS) 5 MG TABS tablet Take 1 tablet (5 mg total) by mouth 2 (two) times daily. 09/13/19  Yes Thurnell Lose, MD  ascorbic acid (VITAMIN C) 500 MG tablet Take 1 tablet (500 mg total) by mouth daily. Patient taking differently:  Take 500 mg by mouth 2 (two) times daily.  08/21/19  Yes Allie Bossier, MD  donepezil (ARICEPT) 5 MG tablet Take 5 mg by mouth at bedtime.   Yes [provider]  ferrous sulfate 325 (65 FE) MG tablet Take 325 mg by mouth daily with breakfast.   Yes [provider]  guaiFENesin (ROBITUSSIN) 100 MG/5ML liquid Take 200 mg by mouth every 6 (six) hours as needed for cough.    Yes [provider]  Ipratropium-Albuterol (COMBIVENT) 20-100 MCG/ACT AERS respimat Inhale 1 puff into the lungs 4 (four) times daily. 08/21/19  Yes Allie Bossier, MD  loperamide (IMODIUM) 2 MG capsule Take 2-4 mg by mouth See admin instructions. Take two capsules (4 mg) for first loose stool, then take one capsule (2 mg) 2 times daily as needed for diarrhea/loose stool (max 4 capsules in 24 hours)   Yes [provider]  LORazepam (ATIVAN) 0.5 MG tablet Take 1 tablet (0.5 mg total) by mouth 2 (two) times daily as needed for anxiety. 10/12/18  Yes Ursula Alert, MD  losartan (COZAAR) 50 MG tablet Take 50 mg by mouth at bedtime.    Yes [provider]  magnesium hydroxide (MILK OF MAGNESIA) 400 MG/5ML suspension Take 30 mLs by mouth at bedtime as needed (constipation).    Yes [provider]  mirtazapine (REMERON) 7.5 MG tablet Take 1 tablet (7.5 mg total) by mouth at bedtime. For sleep 10/12/18  Yes Eappen, Ria Clock, MD  ondansetron (ZOFRAN-ODT) 4 MG disintegrating tablet Take 4 mg by mouth every 6 (six) hours as needed for nausea or vomiting.   Yes [provider]  pantoprazole (PROTONIX) 40 MG tablet Take 1 tablet (40 mg total) by mouth daily. 09/13/19  Yes Thurnell Lose, MD  risperiDONE (RISPERDAL) 0.5 MG tablet Take 1-1.5 tablets (0.5-0.75 mg total) by mouth as directed. Take 1 tablet in the AM and 1.5 tablets PM Patient taking differently: Take 0.5-0.75 mg by mouth See admin instructions. Take 1 tablet (0.5 mg) by mouth every morning and take 1 1/2 tablets (0.75 mg) at bedtime 10/12/18 08/17/20 Yes Eappen, Ria Clock, MD  amLODipine (NORVASC) 10 MG tablet Take 1 tablet (10 mg total) by mouth daily. Patient not taking: Reported on 10/18/2019 08/25/19   Allie Bossier, MD  cyanocobalamin ([facility] VITAMIN B12) 1000 MCG tablet Take 1,000 mcg by mouth daily.  03/17/18   [provider]  ondansetron (ZOFRAN) 4 MG tablet Take 1 tablet (4 mg total) by mouth  every 6 (six) hours as needed for nausea. Patient not taking: Reported on 10/18/2019 08/21/19   Allie Bossier, MD  zinc sulfate 220 (50 Zn) MG capsule Take 1 capsule (220 mg total) by mouth daily. Patient not taking: Reported on 10/18/2019 08/21/19   Allie Bossier, MD    Allergies    Aspirin, Lisinopril, and Lisinopril-hydrochlorothiazide  Review of Systems   Review of Systems  Unable to perform ROS: Dementia    Physical Exam Updated Vital Signs BP (!) 110/46   Pulse 98   Temp 98.4 F (36.9 C) (Oral)   Resp (!) 35   SpO2 96%   Physical Exam Constitutional:      Comments: Lethargic, lying in bed, arouses to painful stimuli  HENT:     Head: Normocephalic and atraumatic.     Mouth/Throat:     Mouth: Mucous membranes are moist.  Eyes:     Extraocular Movements: Extraocular movements intact.     Pupils: Pupils are  equal, round, and reactive to light.  Cardiovascular:     Rate and Rhythm: Bradycardia present.     Pulses: Normal pulses.  Pulmonary:     Effort: Pulmonary effort is normal. No respiratory distress.  Abdominal:     General: Abdomen is flat. There is no distension.     Tenderness: There is no abdominal tenderness.  Musculoskeletal:        General: No deformity or signs of injury.     Cervical back: Normal range of motion. No rigidity.  Skin:    General: Skin is dry.     Capillary Refill: Capillary refill takes less than 2 seconds.     Coloration: Skin is pale.  Neurological:     GCS: GCS eye subscore is 3. GCS verbal subscore is 4. GCS motor subscore is 5.     ED Results / Procedures / Treatments   Labs (all labs ordered are listed, but only abnormal results are displayed) Labs Reviewed  COMPREHENSIVE METABOLIC PANEL - Abnormal; Notable for the following components:      Result Value   Sodium 146 (*)    Potassium 6.0 (*)    Chloride 117 (*)    CO2 18 (*)    BUN 57 (*)    Creatinine, Ser 2.39 (*)    Calcium 7.8 (*)    Total Protein 6.0 (*)     Albumin 2.3 (*)    GFR calc non Af Amer 18 (*)    GFR calc Af Amer 21 (*)    All other components within normal limits  CBC WITH DIFFERENTIAL/PLATELET - Abnormal; Notable for the following components:   RBC 2.56 (*)    Hemoglobin 7.8 (*)    HCT 26.9 (*)    MCV 105.1 (*)    MCHC 29.0 (*)    RDW 18.3 (*)    Platelets 113 (*)    nRBC 0.5 (*)    Lymphs Abs 0.4 (*)    All other components within normal limits  PROTIME-INR - Abnormal; Notable for the following components:   Prothrombin Time 19.0 (*)    INR 1.6 (*)    All other components within normal limits  POC OCCULT BLOOD, ED - Abnormal; Notable for the following components:   Fecal Occult Bld POSITIVE (*)    All other components within normal limits  RESPIRATORY PANEL BY RT PCR (FLU A&B, COVID)  LIPASE, BLOOD    EKG None  Radiology No results found.  Procedures .Critical Care Performed by: Lucrezia Starch, MD Authorized by: Lucrezia Starch, MD   Critical care provider statement:    Critical care time (minutes):  39   Critical care was necessary to treat or prevent imminent or life-threatening deterioration of the following conditions:  CNS failure or compromise, dehydration, shock, sepsis and renal failure   Critical care was time spent personally by me on the following activities:  Discussions with consultants, evaluation of patient's response to treatment, examination of patient, ordering and performing treatments and interventions, ordering and review of laboratory studies, ordering and review of radiographic studies, pulse oximetry, re-evaluation of patient's condition, obtaining history from patient or surrogate and review of old charts   (including critical care time)  Medications Ordered in ED Medications  acetaminophen (TYLENOL) tablet 650 mg (has no administration in time range)    Or  acetaminophen (TYLENOL) suppository 650 mg (has no administration in time range)  morphine 2 MG/ML injection 1 mg (has  no administration in time range)  LORazepam (ATIVAN) tablet 1 mg (has no administration in time range)    Or  LORazepam (ATIVAN) 2 MG/ML concentrated solution 1 mg (has no administration in time range)    Or  LORazepam (ATIVAN) injection 1 mg (has no administration in time range)  glycopyrrolate (ROBINUL) tablet 1 mg (has no administration in time range)    Or  glycopyrrolate (ROBINUL) injection 0.2 mg (has no administration in time range)    Or  glycopyrrolate (ROBINUL) injection 0.2 mg (has no administration in time range)  albuterol (PROVENTIL) (2.5 MG/3ML) 0.083% nebulizer solution 2.5 mg (has no administration in time range)    ED Course  I have reviewed the triage vital signs and the nursing notes.  Pertinent labs & imaging results that were available during my care of the patient were reviewed by me and considered in my medical decision making (see chart for details).  Clinical Course as of Oct 17 2252  Mon Oct 18, 2019  1627 Complete initial assessment, lengthy goals of care discussion with daughter at bedside who is medical power of attorney, discussed my clinical concerns for her current presentation, she wants to pursue comfort measures only, updated nursing staff, will reach out to facility, palliative care team, for care to coordinate further care -anticipate hospitalist admission for comfort measures versus back to SNF for comfort measures   [RD]  [place]   [RD]  1732 Recheck pt, updated daughter, awaiting SW looking into [facility]   [RD]  [place] still waiting to hear   [RD]  1925 Received update from [facility] can potentially take her, but would be at least until tomorrow   [RD]  2023 Hospitalist will come see   [RD]    Clinical Course User Index [RD] Lucrezia Starch, MD   MDM Rules/Calculators/A&P                      85 year old lady presenting to ER with concern for lethargy, dark stools, falls.  Multiple chronic  medical conditions, recently enrolled in palliative care, DNR.  On arrival here patient noted to be hypothermic, hypotensive, bradycardic, pale.  Concern for renal failure, sepsis, GI bleed.  Lengthy discussion with family on arrival in ER.  Daughter who is medical power of attorney wants to focus only on comfort measures.  Lengthy discussions with our social work team, hospital palliative care administrator,, unable to coordinate placement tonight.  For now, will admit to the hospitalist service for further management of comfort measures.  Dr. Marlowe Sax accepting.   Final Clinical Impression(s) / ED Diagnoses Final diagnoses:  Acute renal failure, unspecified acute renal failure type ([place])  Melena  Hyperkalemia  Anemia, unspecified type  Palliative care encounter    Rx / DC Orders ED Discharge Orders    None       Lucrezia Starch, MD 10/18/19 2254

## 2019-10-18 NOTE — Social Work (Signed)
CSW met with Pt and daughter at bedside. CSW discussed options with daughter. CSW called [facility] on behalf of Pt. Waiting for return call.

## 2019-10-18 NOTE — ED Triage Notes (Signed)
To room via EMS from [facility].  Per facility several days of dark, tarry stools.  On Eliquis.  Lump and bruise noted to left side of forehead.  Facility told EMS "may have fell on Saturday".   EMS BP 90/40 HR 48  Gave NS 150 ml IVF BP 112/52 H R 54 CBG 127 SpO2 95%  Pt pulled IV out en route.

## 2019-10-18 NOTE — Progress Notes (Signed)
CSW made referral to [facility] @ [phone] per request of Pt's daughter [name] @ [phone].  [facility] will evalute Pt on 4/20 for hospice services. [facility] will send in-person evaluator to hospital to see Pt.

## 2019-10-18 NOTE — H&P (Addendum)
History and Physical    [name]:[id number] DOB: [birth year] DOA: 10/18/2019  PCP: [facility] Patient coming from: [place]  Chief Complaint: Melena  HPI: Leah Waller is a 85 y.o. female with medical history significant of dementia, bed/wheelchair bound and requires total care with ADLs, incontinent of bowel and bladder, hypertension, CHF, CKD stage III, anemia, anxiety, bipolar disorder.  Recent hospital admission for Covid pneumonia complicated by sepsis, acute hypoxemic respiratory failure, aspiration pneumonia, acute metabolic encephalopathy, bradycardia, AKI, hypernatremia, and possible PE started on anticoagulation.  Presenting from [facility] for evaluation of dark, tarry stools.  In the ED, lump and bruise noted on the left side of her forehead.  EMS reported that patient may have had a fall 2 days ago.  Hypotensive with EMS and blood pressure improved after a 150 cc IV fluid was given.    Per review of palliative care note from 10/15/2019.  Patient is DNR.  Plan is to transition to hospice once finished with skilled days.  ED Course: Hypothermic with temperature 92.8 F.  Bradycardia with heart rate in the 40s on arrival, subsequently improved.  Tachypneic.  Oxygen saturation 93 to 96% on room air.  Hypotensive on arrival with blood pressure 88/34.  Labs showing no leukocytosis.  Hemoglobin 7.8, stable since hospital discharge a month ago.  FOBT positive.  INR 1.6.  Platelet count 113, previously normal.  Sodium 146, consistent with prior labs.  Potassium 6.0.  Chloride 117.  Bicarb 18, anion gap 11.  Blood glucose 95.  BUN 57, creatinine 2.3.  Creatinine was 1.1 a month ago.  Lipase normal.  LFTs normal.  SARS-CoV-2 PCR test pending.  ED provider Dr. Lucrezia Starch had a goals of care discussion with the patient's daughter and medical power of attorney who confirmed that the patient is DNR.  She is interested in pursuing comfort measures  only.  No imaging done. No EKG done.  Temperature improved with Bear Hugger.  Review of Systems:  All systems reviewed and apart from history of presenting illness, are negative.  Past Medical History:  Diagnosis Date  . Anxiety   . B12 deficiency   . Bipolar disorder ([place])   . Dementia ([place])   . Hypertension   . RBBB     History reviewed. No pertinent surgical history.   reports that she has quit smoking. She has never used smokeless tobacco. She reports previous alcohol use. She reports previous drug use.  Allergies  Allergen Reactions  . Aspirin Other (See Comments)    Per MAR  . Lisinopril Other (See Comments)    Per MAR  . Lisinopril-Hydrochlorothiazide Other (See Comments)    Per MAR    Family history: Unable to obtain at this time given patient's dementia/altered mental status.  She is nonverbal.  Prior to Admission medications   Medication Sig Start Date End Date Taking? Authorizing Provider  acetaminophen (TYLENOL) 325 MG tablet Take 650 mg by mouth every 8 (eight) hours.   Yes [provider]  acetaminophen (TYLENOL) 500 MG tablet Take 500 mg by mouth every 4 (four) hours as needed for mild pain or fever.   Yes [provider]  alum & mag hydroxide-simeth (ALUMINA-MAGNESIA-SIMETHICONE) 200-200-20 MG/5ML suspension Take 30 mLs by mouth every 6 (six) hours as needed for indigestion or heartburn.   Yes [provider]  amLODipine (NORVASC) 5 MG tablet Take 7.5 mg by mouth daily.   Yes [provider]  apixaban Arne Cleveland) 5  MG TABS tablet Take 1 tablet (5 mg total) by mouth 2 (two) times daily. 09/13/19  Yes Thurnell Lose, MD  ascorbic acid (VITAMIN C) 500 MG tablet Take 1 tablet (500 mg total) by mouth daily. Patient taking differently: Take 500 mg by mouth 2 (two) times daily.  08/21/19  Yes Allie Bossier, MD  donepezil (ARICEPT) 5 MG tablet Take 5 mg by mouth at bedtime.   Yes [provider]  ferrous sulfate 325  (65 FE) MG tablet Take 325 mg by mouth daily with breakfast.   Yes [provider]  guaiFENesin (ROBITUSSIN) 100 MG/5ML liquid Take 200 mg by mouth every 6 (six) hours as needed for cough.   Yes [provider]  Ipratropium-Albuterol (COMBIVENT) 20-100 MCG/ACT AERS respimat Inhale 1 puff into the lungs 4 (four) times daily. 08/21/19  Yes Allie Bossier, MD  loperamide (IMODIUM) 2 MG capsule Take 2-4 mg by mouth See admin instructions. Take two capsules (4 mg) for first loose stool, then take one capsule (2 mg) 2 times daily as needed for diarrhea/loose stool (max 4 capsules in 24 hours)   Yes [provider]  LORazepam (ATIVAN) 0.5 MG tablet Take 1 tablet (0.5 mg total) by mouth 2 (two) times daily as needed for anxiety. 10/12/18  Yes Ursula Alert, MD  losartan (COZAAR) 50 MG tablet Take 50 mg by mouth at bedtime.    Yes [provider]  magnesium hydroxide (MILK OF MAGNESIA) 400 MG/5ML suspension Take 30 mLs by mouth at bedtime as needed (constipation).    Yes [provider]  mirtazapine (REMERON) 7.5 MG tablet Take 1 tablet (7.5 mg total) by mouth at bedtime. For sleep 10/12/18  Yes Eappen, Ria Clock, MD  ondansetron (ZOFRAN-ODT) 4 MG disintegrating tablet Take 4 mg by mouth every 6 (six) hours as needed for nausea or vomiting.   Yes [provider]  pantoprazole (PROTONIX) 40 MG tablet Take 1 tablet (40 mg total) by mouth daily. 09/13/19  Yes Thurnell Lose, MD  risperiDONE (RISPERDAL) 0.5 MG tablet Take 1-1.5 tablets (0.5-0.75 mg total) by mouth as directed. Take 1 tablet in the AM and 1.5 tablets PM Patient taking differently: Take 0.5-0.75 mg by mouth See admin instructions. Take 1 tablet (0.5 mg) by mouth every morning and take 1 1/2 tablets (0.75 mg) at bedtime 10/12/18 08/17/20 Yes Eappen, Ria Clock, MD  amLODipine (NORVASC) 10 MG tablet Take 1 tablet (10 mg total) by mouth daily. Patient not taking: Reported on 10/18/2019 08/25/19   Allie Bossier, MD  cyanocobalamin ([employer] VITAMIN B12) 1000 MCG tablet Take 1,000 mcg by mouth daily.  03/17/18   [provider]  ondansetron (ZOFRAN) 4 MG tablet Take 1 tablet (4 mg total) by mouth every 6 (six) hours as needed for nausea. Patient not taking: Reported on 10/18/2019 08/21/19   Allie Bossier, MD  zinc sulfate 220 (50 Zn) MG capsule Take 1 capsule (220 mg total) by mouth daily. Patient not taking: Reported on 10/18/2019 08/21/19   Allie Bossier, MD    Physical Exam: Vitals:   10/18/19 1900 10/18/19 1930 10/18/19 2030 10/18/19 2037  BP: (!) 105/46 (!) 96/47 (!) 104/44   Pulse: 86 92 96   Resp: (!) 26 (!) 27 (!) 32   Temp:    98.4 F (36.9 C)  TempSrc:    Oral  SpO2: 94% 94% 95%     Physical Exam  HENT:  Head: Normocephalic.  Dry mucous membranes  Eyes:  Right eye exhibits no discharge. Left eye exhibits no discharge.  Cardiovascular: Normal rate, regular rhythm and intact distal pulses.  Pulmonary/Chest: She has no wheezes. She has no rales.  Tachypneic with respiratory rate up to 30s  Abdominal: Soft. Bowel sounds are normal. She exhibits no distension. There is no abdominal tenderness. There is no guarding.  Musculoskeletal:     Cervical back: Neck supple.     Comments: Right lower extremity edematous and larger in size compared to the contralateral extremity  Neurological:  Awake but not fully alert Nonverbal Not following commands  Skin: Skin is warm and dry. She is not diaphoretic.     Labs on Admission: I have personally reviewed following labs and imaging studies  CBC: Recent Labs  Lab 10/18/19 1622  WBC 6.2  NEUTROABS 5.4  HGB 7.8*  HCT 26.9*  MCV 105.1*  PLT 419*   Basic Metabolic Panel: Recent Labs  Lab 10/18/19 1622  NA 146*  K 6.0*  CL 117*  CO2 18*  GLUCOSE 95  BUN 57*  CREATININE 2.39*  CALCIUM 7.8*   GFR: CrCl cannot be calculated (Unknown ideal weight.). Liver Function Tests: Recent Labs  Lab 10/18/19 1622  AST 26   ALT 28  ALKPHOS 87  BILITOT 0.5  PROT 6.0*  ALBUMIN 2.3*   Recent Labs  Lab 10/18/19 1622  LIPASE 41   No results for input(s): AMMONIA in the last 168 hours. Coagulation Profile: Recent Labs  Lab 10/18/19 1622  INR 1.6*   Cardiac Enzymes: No results for input(s): CKTOTAL, CKMB, CKMBINDEX, TROPONINI in the last 168 hours. BNP (last 3 results) No results for input(s): PROBNP in the last 8760 hours. HbA1C: No results for input(s): HGBA1C in the last 72 hours. CBG: No results for input(s): GLUCAP in the last 168 hours. Lipid Profile: No results for input(s): CHOL, HDL, LDLCALC, TRIG, CHOLHDL, LDLDIRECT in the last 72 hours. Thyroid Function Tests: No results for input(s): TSH, T4TOTAL, FREET4, T3FREE, THYROIDAB in the last 72 hours. Anemia Panel: No results for input(s): VITAMINB12, FOLATE, FERRITIN, TIBC, IRON, RETICCTPCT in the last 72 hours. Urine analysis:    Component Value Date/Time   COLORURINE AMBER (A) 09/06/2019 2204   APPEARANCEUR CLOUDY (A) 09/06/2019 2204   LABSPEC 1.019 09/06/2019 2204   PHURINE 5.0 09/06/2019 2204   GLUCOSEU NEGATIVE 09/06/2019 2204   HGBUR SMALL (A) 09/06/2019 2204   BILIRUBINUR NEGATIVE 09/06/2019 [place] 09/06/2019 2204   PROTEINUR 30 (A) 09/06/2019 2204   NITRITE NEGATIVE 09/06/2019 2204   LEUKOCYTESUR SMALL (A) 09/06/2019 2204    Radiological Exams on Admission: No results found.  Assessment/Plan Principal Problem:   End of life care Active Problems:   AKI (acute kidney injury) ([place])   Hyperkalemia   Severe sepsis (HCC)   Metabolic acidosis   Bradycardia   Head injury   GI bleed   85 year old female with a medical history significant of dementia, bed/wheelchair bound and requires total care with ADLs, incontinent of bowel and bladder, hypertension, chronic combined systolic and diastolic CHF, CKD stage III, anemia, anxiety, bipolar disorder.  Recent hospital admission for Covid pneumonia complicated  by sepsis, acute hypoxemic respiratory failure, aspiration pneumonia, acute metabolic encephalopathy, bradycardia, AKI, hypernatremia, and possible PE started on anticoagulation with Eliquis.  Now presenting from [facility] for evaluation of melena.  In the ED, noted to have hematoma to her left forehead after a recent fall 2 days ago.  Hypothermic, bradycardic, tachypneic, and hypotensive.  Labs showing  evidence of AKI, hyperkalemia, and metabolic acidosis.  FOBT positive.  Hemoglobin stable since hospital discharge a month ago.  ED provider had a goals of care discussion with the patient's daughter and and medical power of attorney.  Patient is DNR.  Daughter decided to pursue comfort care measures.  As such, no imaging or EKG done.  No medications were administered.  Admission for end-of-life care: I had a lengthy goals of care discussion with the patient's daughter and medical power of attorney [name] at bedside.  She confirms that the patient is DNR/DNI and does not want any further medical treatment given patient's advanced age and complicated medical course. Daughter is requesting full comfort care measures only.  She fully understands that the patient is critically ill and will likely die within the next few hours to days.  Plan is for patient to go to hospice in [place].  Social work has informed me that patient will be evaluated by hospice tomorrow morning. -DNR/DNI -Supplemental oxygen as needed for comfort -Albuterol inhaler as needed -Morphine as needed for pain/air hunger -Ativan as needed for anxiety -Glycopyrrolate as needed for excessive secretions -Palliative care consult placed for symptom management  Melena/suspected upper GI bleed: She was started on anticoagulation during hospitalization last month due to suspicion for PE.  Patient is hypotensive.  FOBT positive.  Hemoglobin on initial labs stable since hospital discharge a month ago. -Daughter does not wish to pursue  any further work-up or medical treatment.  Full comfort care as above.  Severe sepsis, unknown source: Presenting with hypothermia, tachypnea, and hypotension. -Daughter does not wish to pursue any further work-up or medical treatment.  Full comfort care as above.  Bradycardia -Daughter does not wish to pursue any further work-up or medical treatment.  Full comfort care as above.  AKI on CKD stage III -Daughter does not wish to pursue any further work-up or medical treatment.  Full comfort care as above.  Hyperkalemia -Daughter does not wish to pursue any further work-up or medical treatment.  Full comfort care as above.  Metabolic acidosis -Daughter does not wish to pursue any further work-up or medical treatment.  Full comfort care as above.  Recent fall/head injury in the setting of anticoagulation -No head imaging done in the ED.  Daughter does not wish to pursue any further work-up or medical treatment.  Full comfort care as above.  DVT prophylaxis: None.  Comfort care. Code Status: DNR/DNI Family Communication: Daughter and other family members at bedside. Disposition Plan: Anticipate death within the next several hours to days. Consults called: Palliative care Admission status: It is my clinical opinion that referral for OBSERVATION is reasonable and necessary in this patient based on the above information provided. The aforementioned taken together are felt to place the patient at high risk for further clinical deterioration. However it is anticipated that the patient may be medically stable for discharge from the hospital within 24 to 48 hours.  The medical decision making on this patient was of high complexity and the patient is at high risk for clinical deterioration, therefore this is a level 3 visit.  Shela Leff MD [facility]  If 7PM-7AM, please contact night-coverage [email or web address]  10/18/2019, 9:20 PM

## 2019-10-19 DIAGNOSIS — E875 Hyperkalemia: Secondary | ICD-10-CM | POA: Diagnosis present

## 2019-10-19 DIAGNOSIS — R159 Full incontinence of feces: Secondary | ICD-10-CM | POA: Diagnosis present

## 2019-10-19 DIAGNOSIS — N179 Acute kidney failure, unspecified: Secondary | ICD-10-CM | POA: Diagnosis present

## 2019-10-19 DIAGNOSIS — Z66 Do not resuscitate: Secondary | ICD-10-CM | POA: Diagnosis present

## 2019-10-19 DIAGNOSIS — I130 Hypertensive heart and chronic kidney disease with heart failure and stage 1 through stage 4 chronic kidney disease, or unspecified chronic kidney disease: Secondary | ICD-10-CM | POA: Diagnosis present

## 2019-10-19 DIAGNOSIS — S0083XA Contusion of other part of head, initial encounter: Secondary | ICD-10-CM | POA: Diagnosis present

## 2019-10-19 DIAGNOSIS — Y92129 Unspecified place in nursing home as the place of occurrence of the external cause: Secondary | ICD-10-CM | POA: Diagnosis not present

## 2019-10-19 DIAGNOSIS — R001 Bradycardia, unspecified: Secondary | ICD-10-CM | POA: Diagnosis present

## 2019-10-19 DIAGNOSIS — R32 Unspecified urinary incontinence: Secondary | ICD-10-CM | POA: Diagnosis present

## 2019-10-19 DIAGNOSIS — Z20822 Contact with and (suspected) exposure to covid-19: Secondary | ICD-10-CM | POA: Diagnosis present

## 2019-10-19 DIAGNOSIS — R6520 Severe sepsis without septic shock: Secondary | ICD-10-CM | POA: Diagnosis present

## 2019-10-19 DIAGNOSIS — Z7401 Bed confinement status: Secondary | ICD-10-CM | POA: Diagnosis not present

## 2019-10-19 DIAGNOSIS — N184 Chronic kidney disease, stage 4 (severe): Secondary | ICD-10-CM | POA: Diagnosis present

## 2019-10-19 DIAGNOSIS — F0390 Unspecified dementia without behavioral disturbance: Secondary | ICD-10-CM | POA: Diagnosis present

## 2019-10-19 DIAGNOSIS — E872 Acidosis: Secondary | ICD-10-CM | POA: Diagnosis present

## 2019-10-19 DIAGNOSIS — F319 Bipolar disorder, unspecified: Secondary | ICD-10-CM | POA: Diagnosis present

## 2019-10-19 DIAGNOSIS — K921 Melena: Secondary | ICD-10-CM | POA: Diagnosis present

## 2019-10-19 DIAGNOSIS — I5042 Chronic combined systolic (congestive) and diastolic (congestive) heart failure: Secondary | ICD-10-CM | POA: Diagnosis present

## 2019-10-19 DIAGNOSIS — W19XXXA Unspecified fall, initial encounter: Secondary | ICD-10-CM | POA: Diagnosis present

## 2019-10-19 DIAGNOSIS — Z515 Encounter for palliative care: Secondary | ICD-10-CM | POA: Diagnosis present

## 2019-10-19 DIAGNOSIS — F419 Anxiety disorder, unspecified: Secondary | ICD-10-CM | POA: Diagnosis present

## 2019-10-19 DIAGNOSIS — I4510 Unspecified right bundle-branch block: Secondary | ICD-10-CM | POA: Diagnosis present

## 2019-10-19 DIAGNOSIS — D631 Anemia in chronic kidney disease: Secondary | ICD-10-CM | POA: Diagnosis present

## 2019-10-19 DIAGNOSIS — I959 Hypotension, unspecified: Secondary | ICD-10-CM | POA: Diagnosis present

## 2019-10-19 DIAGNOSIS — A419 Sepsis, unspecified organism: Secondary | ICD-10-CM | POA: Diagnosis present

## 2019-10-19 DIAGNOSIS — E538 Deficiency of other specified B group vitamins: Secondary | ICD-10-CM | POA: Diagnosis present

## 2019-10-19 NOTE — Progress Notes (Signed)
Manufacturing engineer [facility]) Hospital Liaison RN note  Received request from Westley Hummer, Drew with [facility] team for family interest in [place]. Chart reviewed and spoke with daughter [name] to acknowledge referral. Unfortunately, [facility] not have a room to offer today. Daughter and Michiel Cowboy aware [facility] liaison will follow up tomorrow if room becomes available.   Please call with any questions or concerns.  Thank you. Margaretmary Eddy, BSN, RN [place] Liaison [phone]

## 2019-10-19 NOTE — Progress Notes (Signed)
Palliative Medicine RN Note: Spoke w Bevely Palmer w [facility] to confirm they have the referral for hospice and are working on setting it up. She has our number in case she needs to reach our team.  Marjie Skiff. Marrie Chandra, RN, BSN, [facility] Palliative Medicine Team 10/19/2019 8:44 AM Office [phone]

## 2019-10-19 NOTE — Social Work (Addendum)
12:25pm- CSW has received message from [place], [place] with [facility], no bed available at this time. Will follow and update as availability changes. Olivia Mackie has called pt daughter [name] to provide her this update.   8:54am- CSW acknowledging consult for comfort care. Aware family interested in residential hospice at [facility] in [place].  CSW has followed up with [facility] liaison Bevely Palmer, RN. Await screening determination by hospice MD and bed availability.   Alexander Mt, MSW, [place] Work

## 2019-10-19 NOTE — Progress Notes (Signed)
Nutrition Brief Note  Chart reviewed. Pt now transitioning to comfort care.  No further nutrition interventions warranted at this time.  Please re-consult as needed.   Afra Tricarico W, RD, LDN, CDCES Registered Dietitian II Certified Diabetes Care and Education Specialist Please refer to AMION for RD and/or RD on-call/weekend/after hours pager  

## 2019-10-19 NOTE — Progress Notes (Signed)
PROGRESS NOTE    Leah Waller  YWV:[id number] DOB: 1935-06-30 DOA: 10/18/2019 PCP: [facility]    Brief Narrative:  85 year old female with history of advanced dementia, debility bedbound/wheelchair bound, incontinence, hypertension, stage III CKD, anemia, anxiety and bipolar disorder.  Recent admission for Covid pneumonia complicated by sepsis, hypoxemic respiratory failure and aspiration pneumonia and acute metabolic encephalopathy.  Presented from a skilled nursing facility for evaluation of dark tarry stool.  Hypotensive and hypothermic on arrival. Initially started treatment in the emergency room, however along with severe underlying medical issues including advanced dementia and advanced directives as well as in consultation with healthcare power of attorney, comfort care measures initiated.   Assessment & Plan:   Principal Problem:   End of life care Active Problems:   AKI (acute kidney injury) ([place])   Hyperkalemia   Severe sepsis (HCC)   Metabolic acidosis   Bradycardia   Head injury   GI bleed  Admission for end-of-life care Advanced dementia Severe sepsis Hyperkalemia  Plan: Continue all symptom control medications.  Will provide end-of-life care with medications/supportive therapies/oxygen while in the hospital. Waiting for inpatient bed assignment at inpatient hospice. Transfer when hospice bed available. Until then we will provide her all care needed. RN to pronounce death if happens in the hospital.   DVT prophylaxis: Comfort care Code Status: Comfort care Family Communication: None at the bedside.  Hospice provider communicating with daughter. Disposition Plan: Status is: Observation  The patient remains OBS appropriate and will d/c before 2 midnights.  Dispo: The patient is from: Long-term nursing home              Anticipated d/c is to: Inpatient hospice              Anticipated d/c date is: 1 day              Patient currently is  medically stable to d/c.   Consultants:   Palliative medicine and hospice  Procedures:   None  Antimicrobials:   None   Subjective: Patient seen and examined.  Remains comfortable.  No interaction.  Objective: Vitals:   10/18/19 2230 10/18/19 2302 10/18/19 2323 10/19/19 0550  BP: (!) 102/39 (!) 107/39 (!) 108/35 (!) 104/39  Pulse: 92 91 81 76  Resp: (!) 27 (!) 29 18 17   Temp:  97.9 F (36.6 C) 98.4 F (36.9 C) 98 F (36.7 C)  TempSrc:  Axillary Oral Axillary  SpO2: 97% 96% 98% 97%  Weight:   60 kg     Intake/Output Summary (Last 24 hours) at 10/19/2019 1153 Last data filed at 10/19/2019 0519 Gross per 24 hour  Intake --  Output 50 ml  Net -50 ml   Filed Weights   10/18/19 2323  Weight: 60 kg    Examination:  General exam: Appears calm , not in any distress.  Obtunded.  Does not interact. Respiratory system: Clear to auscultation. Respiratory effort normal.  On room air. Cardiovascular system: S1 & S2 heard, RRR.  Gastrointestinal system: Soft.    Data Reviewed: I have personally reviewed following labs and imaging studies  CBC: Recent Labs  Lab 10/18/19 1622  WBC 6.2  NEUTROABS 5.4  HGB 7.8*  HCT 26.9*  MCV 105.1*  PLT 854*   Basic Metabolic Panel: Recent Labs  Lab 10/18/19 1622  NA 146*  K 6.0*  CL 117*  CO2 18*  GLUCOSE 95  BUN 57*  CREATININE 2.39*  CALCIUM 7.8*   GFR: Estimated Creatinine  Clearance: 14.8 mL/min (A) (by C-G formula based on SCr of 2.39 mg/dL (H)). Liver Function Tests: Recent Labs  Lab 10/18/19 1622  AST 26  ALT 28  ALKPHOS 87  BILITOT 0.5  PROT 6.0*  ALBUMIN 2.3*   Recent Labs  Lab 10/18/19 1622  LIPASE 41   No results for input(s): AMMONIA in the last 168 hours. Coagulation Profile: Recent Labs  Lab 10/18/19 1622  INR 1.6*   Cardiac Enzymes: No results for input(s): CKTOTAL, CKMB, CKMBINDEX, TROPONINI in the last 168 hours. BNP (last 3 results) No results for input(s): PROBNP in the last  8760 hours. HbA1C: No results for input(s): HGBA1C in the last 72 hours. CBG: No results for input(s): GLUCAP in the last 168 hours. Lipid Profile: No results for input(s): CHOL, HDL, LDLCALC, TRIG, CHOLHDL, LDLDIRECT in the last 72 hours. Thyroid Function Tests: No results for input(s): TSH, T4TOTAL, FREET4, T3FREE, THYROIDAB in the last 72 hours. Anemia Panel: No results for input(s): VITAMINB12, FOLATE, FERRITIN, TIBC, IRON, RETICCTPCT in the last 72 hours. Sepsis Labs: No results for input(s): PROCALCITON, LATICACIDVEN in the last 168 hours.  Recent Results (from the past 240 hour(s))  Respiratory Panel by RT PCR (Flu A&B, Covid) - Nasopharyngeal Swab     Status: None   Collection Time: 10/18/19  8:00 PM   Specimen: Nasopharyngeal Swab  Result Value Ref Range Status   SARS Coronavirus 2 by RT PCR NEGATIVE NEGATIVE Final    Comment: (NOTE) SARS-CoV-2 target nucleic acids are NOT DETECTED. The SARS-CoV-2 RNA is generally detectable in upper respiratoy specimens during the acute phase of infection. The lowest concentration of SARS-CoV-2 viral copies this assay can detect is 131 copies/mL. A negative result does not preclude SARS-Cov-2 infection and should not be used as the sole basis for treatment or other patient management decisions. A negative result may occur with  improper specimen collection/handling, submission of specimen other than nasopharyngeal swab, presence of viral mutation(s) within the areas targeted by this assay, and inadequate number of viral copies (<131 copies/mL). A negative result must be combined with clinical observations, patient history, and epidemiological information. The expected result is Negative. Fact Sheet for Patients:  PinkCheek.be Fact Sheet for Healthcare Providers:  GravelBags.it This test is not yet ap proved or cleared by the [place] FDA and  has been authorized for  detection and/or diagnosis of SARS-CoV-2 by FDA under an Emergency Use Authorization (EUA). This EUA will remain  in effect (meaning this test can be used) for the duration of the COVID-19 declaration under Section 564(b)(1) of the Act, 21 U.S.C. section 360bbb-3(b)(1), unless the authorization is terminated or revoked sooner.    Influenza A by PCR NEGATIVE NEGATIVE Final   Influenza B by PCR NEGATIVE NEGATIVE Final    Comment: (NOTE) The Xpert Xpress SARS-CoV-2/FLU/RSV assay is intended as an aid in  the diagnosis of influenza from Nasopharyngeal swab specimens and  should not be used as a sole basis for treatment. Nasal washings and  aspirates are unacceptable for Xpert Xpress SARS-CoV-2/FLU/RSV  testing. Fact Sheet for Patients: PinkCheek.be Fact Sheet for Healthcare Providers: GravelBags.it This test is not yet approved or cleared by the [place] FDA and  has been authorized for detection and/or diagnosis of SARS-CoV-2 by  FDA under an Emergency Use Authorization (EUA). This EUA will remain  in effect (meaning this test can be used) for the duration of the  Covid-19 declaration under Section 564(b)(1) of the Act, 21  U.S.C. section 360bbb-3(b)(1),  unless the authorization is  terminated or revoked. Performed at [facility], [place] [address] [place], [place] [address]          Radiology Studies: No results found.      Scheduled Meds: Continuous Infusions:   LOS: 0 days    Time spent: 25 minutes    Barb Merino, MD [facility] Pager [phone]

## 2019-10-20 NOTE — Discharge Summary (Signed)
Physician Discharge Summary  Leah Waller ZOX:[id number] DOB: 09/17/1934 DOA: 10/18/2019  PCP: Orvis Brill, [facility]  Admit date: 10/18/2019 Discharge date: 10/20/2019  Admitted From: Long-term nursing home Disposition: Hospice home   Discharge Condition: Serious CODE STATUS: Comfort care, DNR Diet recommendation: Pleasure feeding, n.p.o.  Discharge summary: 85 year old female with history of advanced dementia, debility bedbound/wheelchair bound, incontinence, hypertension, stage III CKD, anemia, anxiety and bipolar disorder.  Recent admission for Covid pneumonia complicated by sepsis, hypoxemic respiratory failure and aspiration pneumonia and acute metabolic encephalopathy.  Presented from a skilled nursing facility for evaluation of dark tarry stool.  Hypotensive and hypothermic on arrival. Initially started treatment in the emergency room, however along with severe underlying medical issues including advanced dementia and advanced directives as well as in consultation with healthcare power of attorney, comfort care measures initiated.  Admission for end-of-life care Advanced dementia Severe sepsis Hyperkalemia  Plan: Patient is unresponsive, not eating, nearing her end of life.  She will be transferred to inpatient hospice today.  All symptom control medications available.  She will be medicated before transfer to prevent distress and pain.   Discharge Diagnoses:  Principal Problem:   End of life care Active Problems:   AKI (acute kidney injury) ([place])   Hyperkalemia   Severe sepsis (HCC)   Metabolic acidosis   Bradycardia   Head injury   GI bleed    Discharge Instructions   Allergies as of 10/20/2019      Reactions   Aspirin Other (See Comments)   Per MAR   Lisinopril Other (See Comments)   Per MAR   Lisinopril-hydrochlorothiazide Other (See Comments)   Per MAR      Medication List    STOP taking these medications   acetaminophen 325 MG  tablet Commonly known as: TYLENOL   acetaminophen 500 MG tablet Commonly known as: TYLENOL   Alumina-Magnesia-Simethicone 811-914-78 MG/5ML suspension Generic drug: alum & mag hydroxide-simeth   amLODipine 10 MG tablet Commonly known as: NORVASC   amLODipine 5 MG tablet Commonly known as: NORVASC   apixaban 5 MG Tabs tablet Commonly known as: ELIQUIS   ascorbic acid 500 MG tablet Commonly known as: VITAMIN C   [employer] VITAMIN B12 1000 MCG tablet Generic drug: cyanocobalamin   donepezil 5 MG tablet Commonly known as: ARICEPT   ferrous sulfate 325 (65 FE) MG tablet   guaiFENesin 100 MG/5ML liquid Commonly known as: ROBITUSSIN   Ipratropium-Albuterol 20-100 MCG/ACT Aers respimat Commonly known as: COMBIVENT   loperamide 2 MG capsule Commonly known as: IMODIUM   LORazepam 0.5 MG tablet Commonly known as: ATIVAN   losartan 50 MG tablet Commonly known as: COZAAR   magnesium hydroxide 400 MG/5ML suspension Commonly known as: MILK OF MAGNESIA   mirtazapine 7.5 MG tablet Commonly known as: REMERON   ondansetron 4 MG disintegrating tablet Commonly known as: ZOFRAN-ODT   ondansetron 4 MG tablet Commonly known as: ZOFRAN   pantoprazole 40 MG tablet Commonly known as: PROTONIX   risperiDONE 0.5 MG tablet Commonly known as: RisperDAL   zinc sulfate 220 (50 Zn) MG capsule       Allergies  Allergen Reactions  . Aspirin Other (See Comments)    Per MAR  . Lisinopril Other (See Comments)    Per MAR  . Lisinopril-Hydrochlorothiazide Other (See Comments)    Per MAR    Consultations:  Hospice   Procedures/Studies:  No results found.   Subjective: Patient seen and examined.  Remains obtunded and unresponsive.   Discharge Exam:  Vitals:   10/19/19 2053 10/20/19 0351  BP: (!) 92/41 (!) 99/37  Pulse: 70 86  Resp: 17 20  Temp: 99 F (37.2 C) 99.8 F (37.7 C)  SpO2: 95% 96%   Vitals:   10/19/19 0550 10/19/19 1301 10/19/19 2053 10/20/19 0351  BP:  (!) 104/39 (!) 96/45 (!) 92/41 (!) 99/37  Pulse: 76 73 70 86  Resp: 17 19 17 20   Temp: 98 F (36.7 C) 99.2 F (37.3 C) 99 F (37.2 C) 99.8 F (37.7 C)  TempSrc: Axillary Axillary Axillary Oral  SpO2: 97% 96% 95% 96%  Weight:        General: Sick looking.  Obtunded.  Winces some on strong stimulation.  Currently comfortable. Cardiovascular: No added sounds Respiratory: Conducted airway sounds.  On room air Abdominal: Soft, NT, ND, bowel sounds +   The results of significant diagnostics from this hospitalization (including imaging, microbiology, ancillary and laboratory) are listed below for reference.     Microbiology: Recent Results (from the past 240 hour(s))  Respiratory Panel by RT PCR (Flu A&B, Covid) - Nasopharyngeal Swab     Status: None   Collection Time: 10/18/19  8:00 PM   Specimen: Nasopharyngeal Swab  Result Value Ref Range Status   SARS Coronavirus 2 by RT PCR NEGATIVE NEGATIVE Final    Comment: (NOTE) SARS-CoV-2 target nucleic acids are NOT DETECTED. The SARS-CoV-2 RNA is generally detectable in upper respiratoy specimens during the acute phase of infection. The lowest concentration of SARS-CoV-2 viral copies this assay can detect is 131 copies/mL. A negative result does not preclude SARS-Cov-2 infection and should not be used as the sole basis for treatment or other patient management decisions. A negative result may occur with  improper specimen collection/handling, submission of specimen other than nasopharyngeal swab, presence of viral mutation(s) within the areas targeted by this assay, and inadequate number of viral copies (<131 copies/mL). A negative result must be combined with clinical observations, patient history, and epidemiological information. The expected result is Negative. Fact Sheet for Patients:  PinkCheek.be Fact Sheet for Healthcare Providers:  GravelBags.it This test is not yet  ap proved or cleared by the [place] FDA and  has been authorized for detection and/or diagnosis of SARS-CoV-2 by FDA under an Emergency Use Authorization (EUA). This EUA will remain  in effect (meaning this test can be used) for the duration of the COVID-19 declaration under Section 564(b)(1) of the Act, 21 U.S.C. section 360bbb-3(b)(1), unless the authorization is terminated or revoked sooner.    Influenza A by PCR NEGATIVE NEGATIVE Final   Influenza B by PCR NEGATIVE NEGATIVE Final    Comment: (NOTE) The Xpert Xpress SARS-CoV-2/FLU/RSV assay is intended as an aid in  the diagnosis of influenza from Nasopharyngeal swab specimens and  should not be used as a sole basis for treatment. Nasal washings and  aspirates are unacceptable for Xpert Xpress SARS-CoV-2/FLU/RSV  testing. Fact Sheet for Patients: PinkCheek.be Fact Sheet for Healthcare Providers: GravelBags.it This test is not yet approved or cleared by the [place] FDA and  has been authorized for detection and/or diagnosis of SARS-CoV-2 by  FDA under an Emergency Use Authorization (EUA). This EUA will remain  in effect (meaning this test can be used) for the duration of the  Covid-19 declaration under Section 564(b)(1) of the Act, 21  U.S.C. section 360bbb-3(b)(1), unless the authorization is  terminated or revoked. Performed at [facility], [place] [address] [place], [place] [address]  Labs: BNP (last 3 results) Recent Labs    08/17/19 1909 09/13/19 0333  BNP 39.0 824.2*   Basic Metabolic Panel: Recent Labs  Lab 10/18/19 1622  NA 146*  K 6.0*  CL 117*  CO2 18*  GLUCOSE 95  BUN 57*  CREATININE 2.39*  CALCIUM 7.8*   Liver Function Tests: Recent Labs  Lab 10/18/19 1622  AST 26  ALT 28  ALKPHOS 87  BILITOT 0.5  PROT 6.0*  ALBUMIN 2.3*   Recent Labs  Lab 10/18/19 1622  LIPASE 41   No results for input(s): AMMONIA in the  last 168 hours. CBC: Recent Labs  Lab 10/18/19 1622  WBC 6.2  NEUTROABS 5.4  HGB 7.8*  HCT 26.9*  MCV 105.1*  PLT 113*   Cardiac Enzymes: No results for input(s): CKTOTAL, CKMB, CKMBINDEX, TROPONINI in the last 168 hours. BNP: Invalid input(s): POCBNP CBG: No results for input(s): GLUCAP in the last 168 hours. D-Dimer No results for input(s): DDIMER in the last 72 hours. Hgb A1c No results for input(s): HGBA1C in the last 72 hours. Lipid Profile No results for input(s): CHOL, HDL, LDLCALC, TRIG, CHOLHDL, LDLDIRECT in the last 72 hours. Thyroid function studies No results for input(s): TSH, T4TOTAL, T3FREE, THYROIDAB in the last 72 hours.  Invalid input(s): FREET3 Anemia work up No results for input(s): VITAMINB12, FOLATE, FERRITIN, TIBC, IRON, RETICCTPCT in the last 72 hours. Urinalysis    Component Value Date/Time   COLORURINE AMBER (A) 09/06/2019 2204   APPEARANCEUR CLOUDY (A) 09/06/2019 2204   LABSPEC 1.019 09/06/2019 2204   PHURINE 5.0 09/06/2019 2204   GLUCOSEU NEGATIVE 09/06/2019 2204   HGBUR SMALL (A) 09/06/2019 2204   BILIRUBINUR NEGATIVE 09/06/2019 2204   [place] 09/06/2019 2204   PROTEINUR 30 (A) 09/06/2019 2204   NITRITE NEGATIVE 09/06/2019 2204   LEUKOCYTESUR SMALL (A) 09/06/2019 2204   Sepsis Labs Invalid input(s): PROCALCITONIN,  WBC,  LACTICIDVEN Microbiology Recent Results (from the past 240 hour(s))  Respiratory Panel by RT PCR (Flu A&B, Covid) - Nasopharyngeal Swab     Status: None   Collection Time: 10/18/19  8:00 PM   Specimen: Nasopharyngeal Swab  Result Value Ref Range Status   SARS Coronavirus 2 by RT PCR NEGATIVE NEGATIVE Final    Comment: (NOTE) SARS-CoV-2 target nucleic acids are NOT DETECTED. The SARS-CoV-2 RNA is generally detectable in upper respiratoy specimens during the acute phase of infection. The lowest concentration of SARS-CoV-2 viral copies this assay can detect is 131 copies/mL. A negative result does not  preclude SARS-Cov-2 infection and should not be used as the sole basis for treatment or other patient management decisions. A negative result may occur with  improper specimen collection/handling, submission of specimen other than nasopharyngeal swab, presence of viral mutation(s) within the areas targeted by this assay, and inadequate number of viral copies (<131 copies/mL). A negative result must be combined with clinical observations, patient history, and epidemiological information. The expected result is Negative. Fact Sheet for Patients:  PinkCheek.be Fact Sheet for Healthcare Providers:  GravelBags.it This test is not yet ap proved or cleared by the [place] FDA and  has been authorized for detection and/or diagnosis of SARS-CoV-2 by FDA under an Emergency Use Authorization (EUA). This EUA will remain  in effect (meaning this test can be used) for the duration of the COVID-19 declaration under Section 564(b)(1) of the Act, 21 U.S.C. section 360bbb-3(b)(1), unless the authorization is terminated or revoked sooner.    Influenza A by PCR NEGATIVE  NEGATIVE Final   Influenza B by PCR NEGATIVE NEGATIVE Final    Comment: (NOTE) The Xpert Xpress SARS-CoV-2/FLU/RSV assay is intended as an aid in  the diagnosis of influenza from Nasopharyngeal swab specimens and  should not be used as a sole basis for treatment. Nasal washings and  aspirates are unacceptable for Xpert Xpress SARS-CoV-2/FLU/RSV  testing. Fact Sheet for Patients: PinkCheek.be Fact Sheet for Healthcare Providers: GravelBags.it This test is not yet approved or cleared by the [place] FDA and  has been authorized for detection and/or diagnosis of SARS-CoV-2 by  FDA under an Emergency Use Authorization (EUA). This EUA will remain  in effect (meaning this test can be used) for the duration of the   Covid-19 declaration under Section 564(b)(1) of the Act, 21  U.S.C. section 360bbb-3(b)(1), unless the authorization is  terminated or revoked. Performed at [facility], [place] [address], [place], [place] [address]      Time coordinating discharge: 35 minutes  SIGNED:   Barb Merino, MD  [facility] 10/20/2019, 12:54 PM

## 2019-10-20 NOTE — Social Work (Signed)
Clinical Social Worker facilitated patient discharge including contacting patient family and facility to confirm patient discharge plans.  Clinical information faxed to facility and family agreeable with plan.  CSW arranged ambulance transport via [facility] to [facility].  RN to call [phone]  with report prior to discharge.  Clinical Social Worker will sign off for now as social work intervention is no longer needed. Please consult [place] again if new need arises.  Westley Hummer, MSW, LCSW Clinical Social Worker

## 2019-10-20 NOTE — Progress Notes (Signed)
Patient just left [facility] to [facility] via [place].  Discharge packet with documents were given to [facility] staff.  IV site to remain per instruction from day shift RN.  Report was given earlier by day shift RN Gaspar Bidding to [facility] staff.

## 2019-10-20 NOTE — Progress Notes (Signed)
Manufacturing engineer [facility]) Hospital Liaison RN note  Patient approved for Hospice Home. Transportation to be arranged for a 5 pm pickup. Daughter notified.  Please send completed and signed DNR with patient.  RN please call report to [phone].  Please fax discharge summary to [phone].  Please call with any hospice related questions or concerns.  Thank you. Margaretmary Eddy, BSN, RN [phone]

## 2019-10-20 NOTE — Social Work (Addendum)
4:43pm- CSW has called and alerted pt daughter [name] of timing of ambulance, no answer but message left.   12:32pm- Bed available, consents signed. DNR signed on chart. Secure messaged MD for d/c summary. Bedside RN number given to Olivia Mackie with [facility], will also alert Otila Kluver, bedside RN at [facility].  8:38am- Await update on bed availability for hospice home in [place], pt approved.   Westley Hummer, MSW, [place] [facility]

## 2019-10-30 DEATH — deceased

## 2021-11-01 IMAGING — CR DG CHEST 1V PORT
1 series · 1 of 1 positions shown · non-contrast
Comparison: Single-view of the chest 07/08/2009.

CLINICAL DATA: Altered mental status for 2 days.

EXAM:
PORTABLE CHEST 1 VIEW

[dg chest port 1 view]
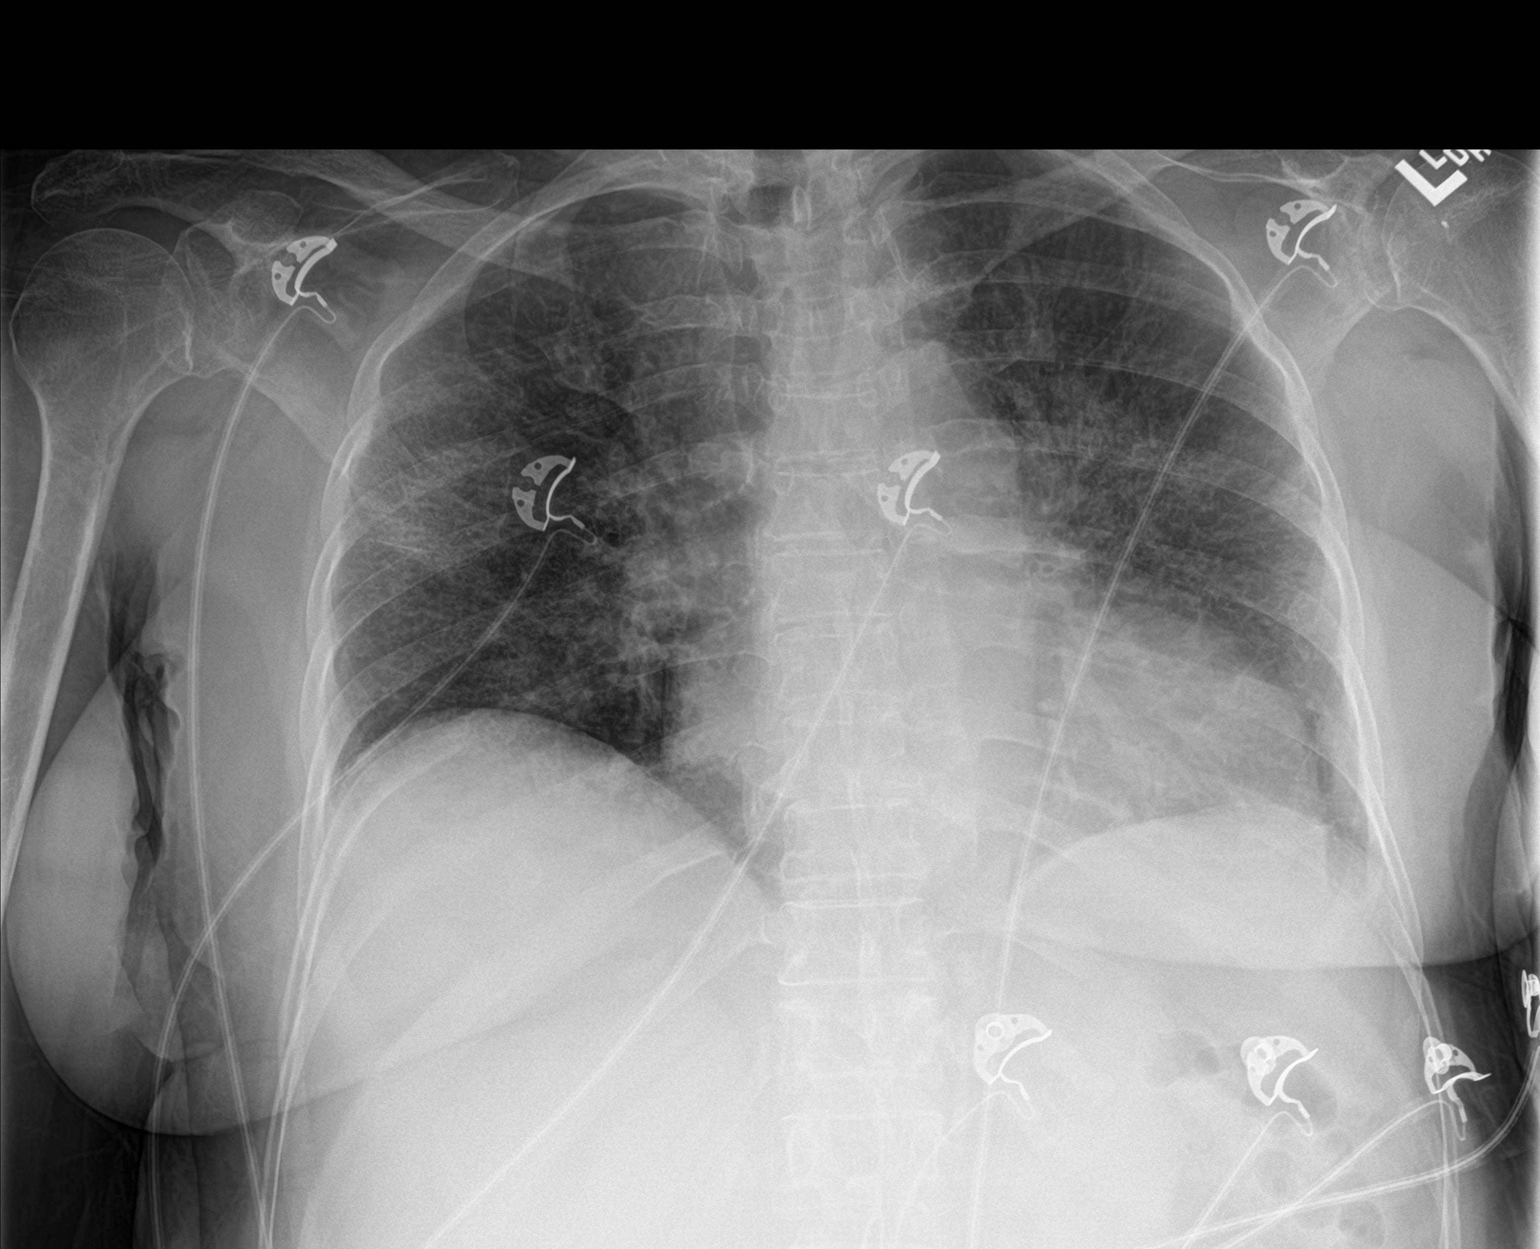

[1 of 1 positions shown; findings below may reference images not displayed]

FINDINGS: Hazy airspace disease is seen bilaterally, most notable in the upper
lung zones and left lung base. No pneumothorax or pleural effusion.
Heart size is mildly enlarged. No acute or focal bony abnormality.
IMPRESSION: Hazy bilateral airspace disease has an appearance most worrisome for
pneumonia.

## 2021-11-01 IMAGING — CT CT HEAD W/O CM
3 series · 15 of 47 positions shown, 18 images · non-contrast
Comparison: Cervical spine MRI 09/19/2008. Head CT scan 04/30/2019.

CLINICAL DATA: Altered mental status for 2 days.

EXAM:
CT HEAD WITHOUT CONTRAST
CT CERVICAL SPINE WITHOUT CONTRAST
TECHNIQUE: Multidetector CT imaging of the head and cervical spine was
performed following the standard protocol without intravenous
contrast. Multiplanar CT image reconstructions of the cervical spine
were also generated.

[Series 2: head wo · axial · 0.45mm/px · z∈[-120,+5]mm · 9 of 31 slices shown, 12 images]
[im 3/31  brain]
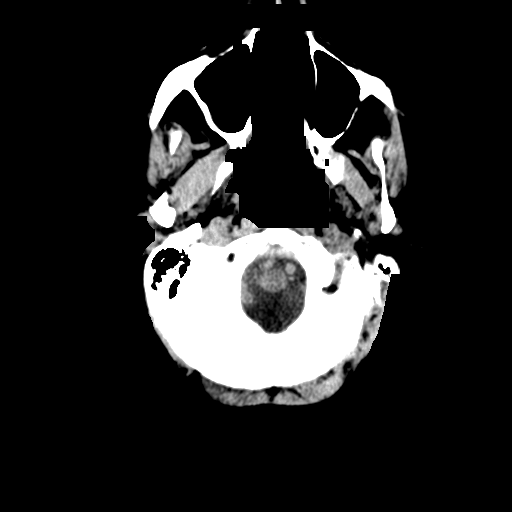
[im 3/31  bone]
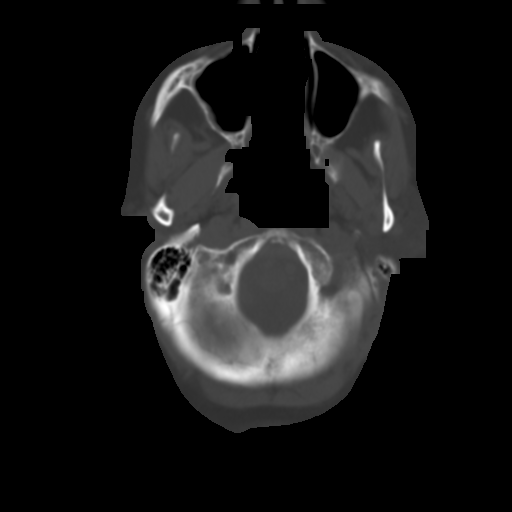
[im 6/31  brain]
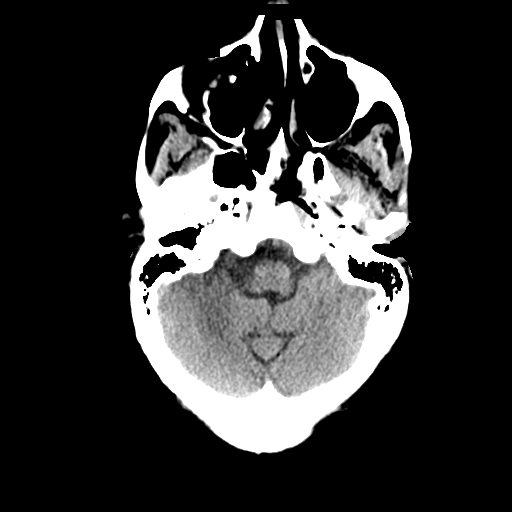
[im 9/31  brain]
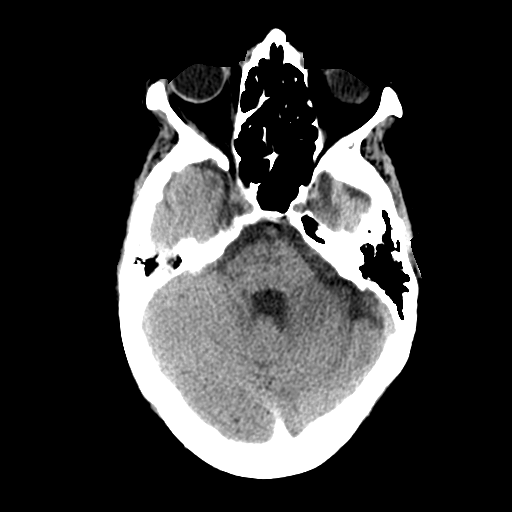
[im 12/31  brain]
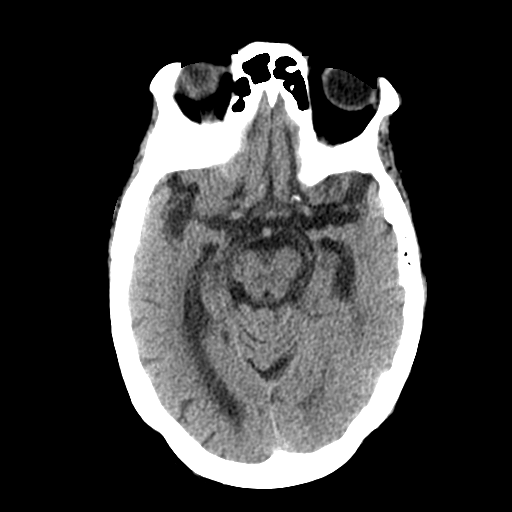
[im 16/31  brain]
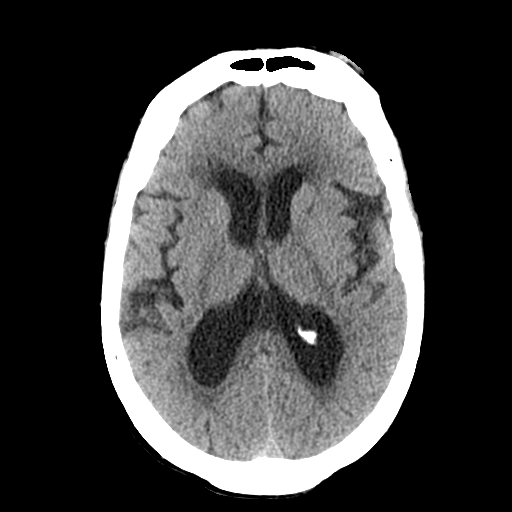
[im 16/31  bone]
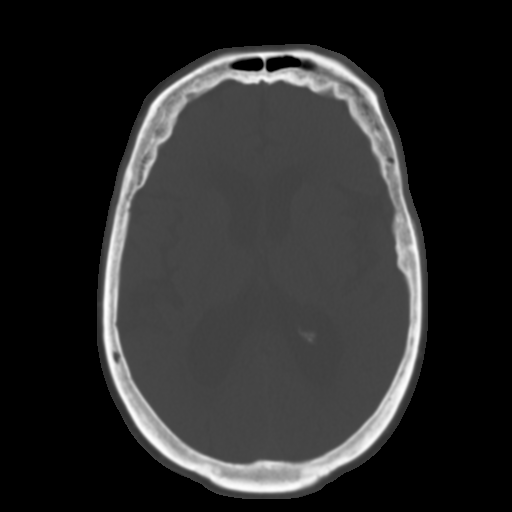
[im 19/31  brain]
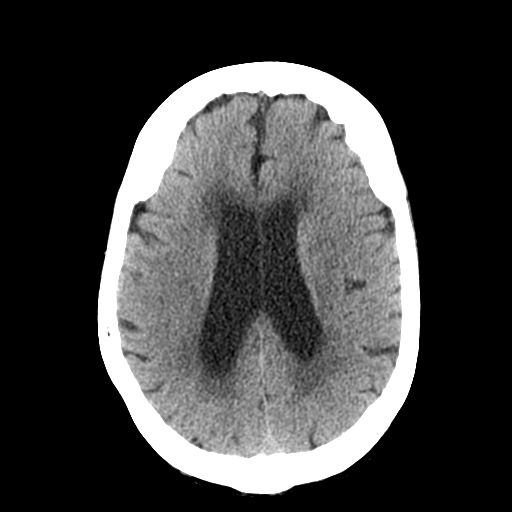
[im 22/31  brain]
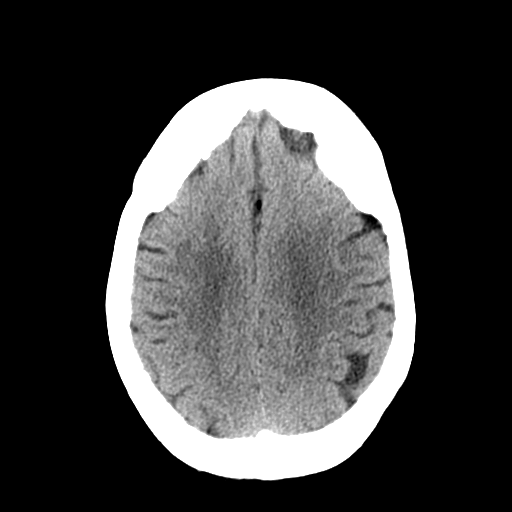
[im 25/31  brain]
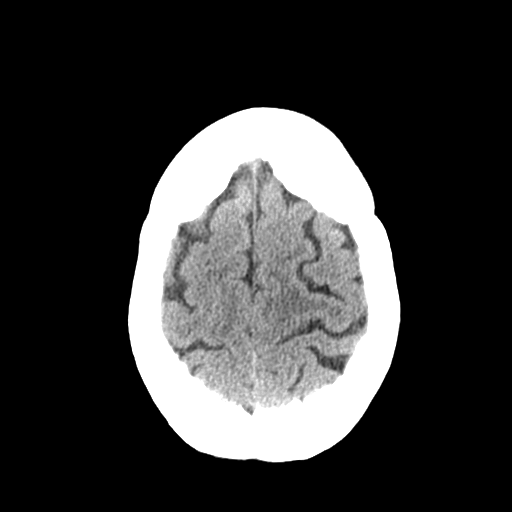
[im 28/31  brain]
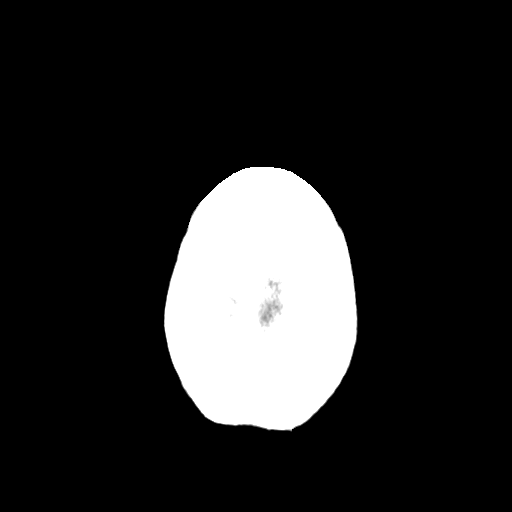
[im 28/31  bone]
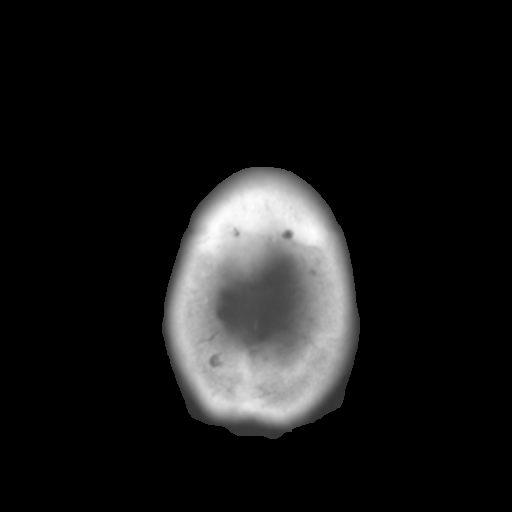

[Series 4: coronal soft tissue · coronal · 0.30mm/px · 3 of 76 slices shown]
[im 26/76  brain]
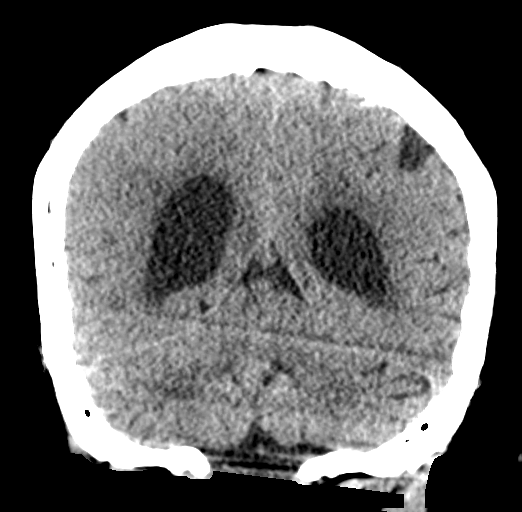
[im 34/76  brain]
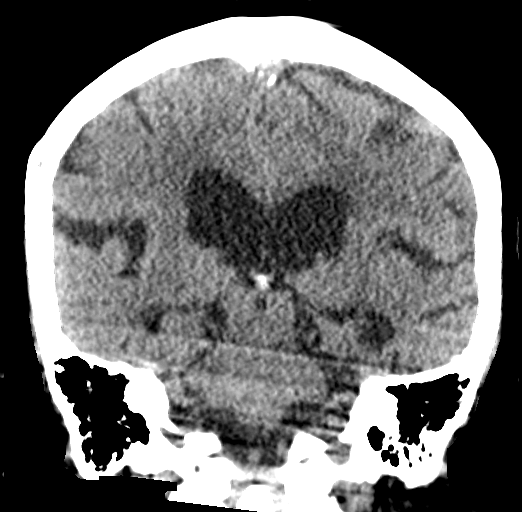
[im 42/76  brain]
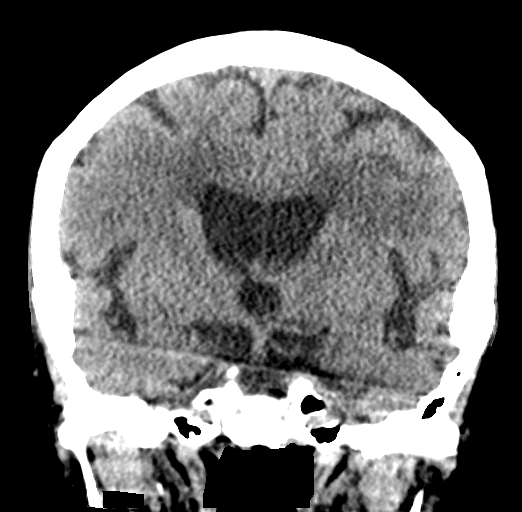

[Series 5: sagittal soft tissue · sagittal · 0.30mm/px · 3 of 53 slices shown]
[im 18/53  brain]
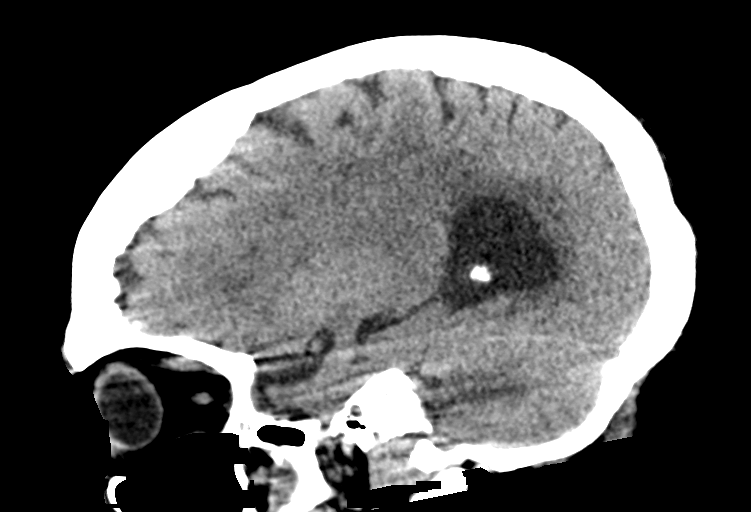
[im 27/53  brain]
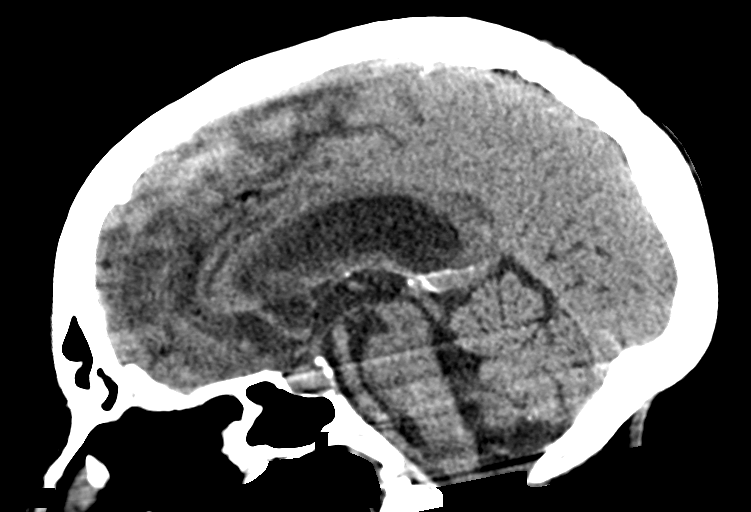
[im 35/53  brain]
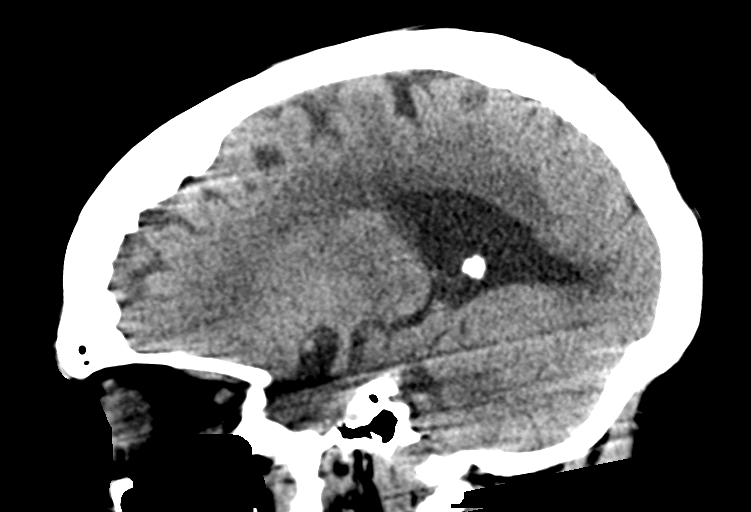

[15 of 47 positions shown; findings below may reference images not displayed]

FINDINGS: CT HEAD FINDINGS

Brain: No evidence of acute infarction, hemorrhage, hydrocephalus,
extra-axial collection or mass lesion/mass effect. Mild chronic
microvascular ischemic change again seen.

Vascular: Atherosclerosis.

Skull: Intact.  No focal lesion.

Sinuses/Orbits: Status post cataract surgery on the right. The
patient is also status post right maxillary antrostomy.

Other: None. Exaggeration of the normal cervical lordosis. 0.3 cm
anterolisthesis C4 on C5 due to facet arthropathy.

CT CERVICAL SPINE FINDINGS

Alignment: There is some exaggeration of the normal cervical
lordosis. 0.3 cm anterolisthesis C4 on C5 due to facet arthropathy.

Skull base and vertebrae: No acute fracture. No primary bone lesion
or focal pathologic process.

Soft tissues and spinal canal: No prevertebral fluid or swelling. No
visible canal hematoma.

Disc levels: There is some loss of disc space height at C4-5. Small
disc bulges are seen at C5-6 and C6-7.

Upper chest: Lung apices clear.

Other: None.
IMPRESSION: No acute abnormality head or cervical spine.

Chronic microvascular change.

Mild cervical spondylosis.
